# Patient Record
Sex: Male | Born: 1935 | Race: White | Hispanic: No | Marital: Married | Smoking: Former smoker
Health system: Southern US, Community
[De-identification: ages and names within clinical notes are randomized; demographics above are authoritative.]

## PROBLEM LIST (undated history)

## (undated) DIAGNOSIS — Z8709 Personal history of other diseases of the respiratory system: Secondary | ICD-10-CM

## (undated) DIAGNOSIS — M109 Gout, unspecified: Secondary | ICD-10-CM

## (undated) DIAGNOSIS — C159 Malignant neoplasm of esophagus, unspecified: Principal | ICD-10-CM

## (undated) DIAGNOSIS — K2270 Barrett's esophagus without dysplasia: Secondary | ICD-10-CM

## (undated) DIAGNOSIS — E119 Type 2 diabetes mellitus without complications: Secondary | ICD-10-CM

## (undated) DIAGNOSIS — K219 Gastro-esophageal reflux disease without esophagitis: Secondary | ICD-10-CM

## (undated) HISTORY — PX: OTHER SURGICAL HISTORY: SHX169

## (undated) HISTORY — PX: TONSILLECTOMY: SUR1361

## (undated) HISTORY — PX: CHOLECYSTECTOMY: SHX55

## (undated) HISTORY — DX: Malignant neoplasm of esophagus, unspecified: C15.9

## (undated) HISTORY — PX: NISSEN FUNDOPLICATION: SHX2091

## (undated) HISTORY — PX: LARYNGOSCOPY: SUR817

## (undated) SURGERY — EGD (ESOPHAGOGASTRODUODENOSCOPY)
Anesthesia: Moderate Sedation

---

## 1999-04-15 ENCOUNTER — Ambulatory Visit (HOSPITAL_COMMUNITY): Admission: RE | Admit: 1999-04-15 | Discharge: 1999-04-15 | Payer: Self-pay | Admitting: Gastroenterology

## 2001-02-27 ENCOUNTER — Ambulatory Visit (HOSPITAL_COMMUNITY): Admission: RE | Admit: 2001-02-27 | Discharge: 2001-02-27 | Payer: Self-pay | Admitting: Gastroenterology

## 2003-01-24 ENCOUNTER — Inpatient Hospital Stay (HOSPITAL_COMMUNITY): Admission: EM | Admit: 2003-01-24 | Discharge: 2003-01-27 | Payer: Self-pay | Admitting: Emergency Medicine

## 2003-01-24 ENCOUNTER — Encounter: Payer: Self-pay | Admitting: Emergency Medicine

## 2003-01-25 ENCOUNTER — Encounter: Payer: Self-pay | Admitting: Internal Medicine

## 2003-09-27 ENCOUNTER — Ambulatory Visit (HOSPITAL_COMMUNITY): Admission: RE | Admit: 2003-09-27 | Discharge: 2003-09-27 | Payer: Self-pay | Admitting: Gastroenterology

## 2003-09-27 ENCOUNTER — Encounter (INDEPENDENT_AMBULATORY_CARE_PROVIDER_SITE_OTHER): Payer: Self-pay | Admitting: Specialist

## 2005-12-09 ENCOUNTER — Encounter: Admission: RE | Admit: 2005-12-09 | Discharge: 2005-12-09 | Payer: Self-pay | Admitting: Gastroenterology

## 2007-03-13 ENCOUNTER — Ambulatory Visit (HOSPITAL_COMMUNITY): Admission: RE | Admit: 2007-03-13 | Discharge: 2007-03-13 | Payer: Self-pay | Admitting: Gastroenterology

## 2007-10-04 ENCOUNTER — Encounter: Admission: RE | Admit: 2007-10-04 | Discharge: 2007-10-27 | Payer: Self-pay | Admitting: Orthopedic Surgery

## 2008-10-11 ENCOUNTER — Encounter: Admission: RE | Admit: 2008-10-11 | Discharge: 2008-10-11 | Payer: Self-pay | Admitting: Internal Medicine

## 2009-08-19 ENCOUNTER — Ambulatory Visit (HOSPITAL_COMMUNITY): Admission: RE | Admit: 2009-08-19 | Discharge: 2009-08-19 | Payer: Self-pay | Admitting: Gastroenterology

## 2010-12-30 ENCOUNTER — Other Ambulatory Visit: Payer: Self-pay | Admitting: Gastroenterology

## 2011-04-30 NOTE — Discharge Summary (Signed)
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.  He was in his usual state of good health until 01/23/2003  when after eating two hot dogs he began to experience increased abdominal  distension and pain.  His last normal bowel movement was two days prior to  admission, although he had passed some watery fluid since then.  There had  been no fevers or chills.  There had been some retching but no vomiting.  On  arrival to the emergency room, a KUB and upright abdomen revealed evidence  of a massively dilated stomach and small-bowel obstruction.  He was admitted  for treatment.   PHYSICAL EXAMINATION:  GENERAL:  Examination at the time of admission  revealed a very uncomfortable man with substantial abdominal distension.  HEENT:  Within normal limits.  CHEST:  Clear.  BACK:  Examination revealed no CVA or point tenderness.  CARDIOVASCULAR:  Normal S1 and S2 without rubs, murmurs, or gallops.  ABDOMEN:  Distended.  Bowel sounds were markedly decreased.  There was  diffuse mild tenderness without guarding or rebound.  NEUROLOGIC:  Normal.  EXTREMITIES:  Normal.   RELEVANT LABORATORY STUDIES:  CBC revealed a white count of 7800, hemoglobin  15.8, hematocrit 45.1.  Sodium 140, potassium 3.6, chloride 106, bicarbonate  26, creatinine 1.1, BUN 27.  Of note was that the blood sugar was 336.  Liver profile was normal.   HOSPITAL COURSE:  Consultation was obtained from Dr. Magnus Ivan of the  general  surgery service who assisted with the patient's management.  On admission,  the patient had an NG tube placed.  He was made n.p.o. and was given  intravenous fluids in the form of normal saline with 10 mEq of KCl at 150 cc  per hour.  Protonix 40 mg IV daily was administered.  Because he was n.p.o.,  I put him on a sliding scale insulin regimen.  Subsequent laboratory studies  included a hemoglobin A1C which was 8.4, suggesting that his blood sugar had  been abnormal for some time.   An upper GI and small-bowel follow-through was done.  This revealed evidence  of the patient's previous Nissen fundoplication.  There were no signs of  gastric outlet obstruction.  A small bowel study revealed dilatation of the  jejunum with a probable area of partial small-bowel obstruction in the  region of the mid-small bowel.  In spite of this finding, the patient's  condition steadily improved.  He started passing flatus and then eventually  had good bowel function.  By 01/27/2003, he was comfortable.  He was eating  food without difficulty.  He had good bowel sounds.  X-rays obtained  01/26/2003 showed that contrast had passed into the colon without difficulty  and that the obstruction picture had resolved.  It was therefore the opinion  of the surgical doctors that the patient could be discharged.  Therefore on  01/27/2003, the patient was discharged.   DISCHARGE DIAGNOSES:  1. Small-bowel obstruction, mid-small bowel, of uncertain etiology, possibly     secondary to adhesion.  2. Diabetes.  3. History of Nissen fundoplication.  4. History of Barrett's esophagus.  5. History of gout.   DISCHARGE MEDICATIONS:  1. Allopurinol 50 mg daily.  2. Baby aspirin one daily.  3. Amaryl 2 mg daily.   FOLLOW UP:  He has decided that he wants to follow up with Dr. Merril Abbe at  [facility], and I will make sure that a copy of this  dictation goes to Dr. Merril Abbe.   NOTE:  A  glucometer was provided to the patient at the time of discharge.  His wife is a nurse who is very knowledgeable about diabetes and will help  him with monitoring his blood sugars at home.   CONDITION ON DISCHARGE:  Good.                                               Sherin Quarry, MD    SY/MEDQ  D:  01/27/2003  T:  01/27/2003  Job:  [id number]   cc:   Fayrene Fearing L. Malon Kindle., M.D.  [address]  [place]  [place] [address]  Fax: [phone]   Ike Bene, M.D.  [address]  [place]  [place] [address]  Fax: [phone]   Thornton Park. Daphine Deutscher, M.D.  [address]  [place]  [place] [address]  Fax: [phone]

## 2011-04-30 NOTE — Procedures (Signed)
[facility]  Patient:    Matthew White, Matthew White                  MRN: [id number] Proc. Date: 02/27/01 Adm. Date:  [id number] Attending:  Orland Mustard CC:         Thornton Park. Daphine Deutscher, M.D.   Procedure Report  PROCEDURE:  Esophagogastroduodenoscopy and biopsy.  MEDICATIONS:  Cetacaine spray, fentanyl 87.5 mcg, Versed 9 mg IV.  INDICATION:  A nice 77 year old gentleman with a long history of Barretts esophagus.  Had a ______ done for history of reflux disease in 1998 and underwent a laparoscopic Nissen fundoplication.  Has done exceedingly well. He is undergoing a screening test for Barretts esophagus.  INFORMED CONSENT:  The procedure had been explained to the patient and consent obtained.  DESCRIPTION OF PROCEDURE:  With the patient in the left lateral decubitus position, the Olympus video endoscope was inserted blindly into the esophagus and advanced under direct visualization.  The stomach was entered, pylorus identified and passed.  Duodenum including the bulb and second portion seen well and were unremarkable.  The scope withdrawn back into the stomach.  The pyloric channel, antrum, and body were all normal.  Fundus and cardia were seen well on retroflexed view and were normal.  Diaphragmatic hiatus was located at 40 cm.  There was gastric-appearing mucosa extending from 40 cm up to 29-30 cm consistent with Barretts esophagus.  The GE junction was somewhat irregular at that area.  Had the exact appearance it had had on previous endoscopies.  Multiple biopsies were taken randomly throughout.  The scope was withdrawn.  The patient tolerated the procedure well.  ASSESSMENT:  Barretts esophagus extending from 40 to 30 cm with no gross evidence of ulceration or tumor.  PLAN:  We will check pathology and probably recommend repeating in two years. DD:  02/27/01 TD:  02/28/01 Job: [id number]

## 2011-04-30 NOTE — Op Note (Signed)
NAME:  Matthew White, Matthew White                ACCOUNT NO.:  [id number]   MEDICAL RECORD NO.:  [id number]          PATIENT TYPE:  AMB   LOCATION:  ENDO                         FACILITY:  [facility]   PHYSICIAN:  James L. Malon Kindle., M.D.DATE OF BIRTH:  [birth year]   DATE OF PROCEDURE:  03/13/2007  DATE OF DISCHARGE:                               OPERATIVE REPORT   PROCEDURE:  Esophagoscopy with esophageal dilatation under fluoroscopic  guidance.   MEDICATIONS:  Fentanyl 75 mcg, Versed 7.5 mg IV.   INDICATION:  This nice gentleman has Barrett's esophagus, has undergone  previous lap Nissen, has been doing well.  His Barrett's is biopsied  routinely.  He has had trouble swallowing and had a stricture and recent  endoscopy with biopsy.  We were able to get the Olympus scope through it  and he has continued to be treated for reflux.   DESCRIPTION OF PROCEDURE:  The procedure has been explained to the  patient, including potential risks and benefits, and consent obtained.  In the supine position for endoscopy using fluoroscopic control, the  scope was inserted.  The Pentax scope was inserted, although the Olympus  scope did pass last endoscopy and the Pentax scope would not pass.  The  stricture was located at the distal part of the esophagus.  There was  Barrett's esophagus.  Using fluoroscopic guidance, I passed the [facility]  guide wire through the stricture into the stomach.  We then withdrew the  scope over the guide wire, taking care to make sure that the wire was  not moved.  I then passed in a sequential manner with the patient's neck  extended using fluoroscopic guidance the 27, 30, 33 and 36 [place]  dilators.  There was a small amount of heme with the 36-French dilator.  The scope was withdrawn and the patient tolerated the procedure well.   ASSESSMENT:  Normal esophageal stricture dilated to 36 [place].   PLAN:  Routine liquids for 4 hours, soft foods, advance diet.  Will call  us back as  needed.  Will see back in the office in 6 months.           ______________________________  Llana Aliment. Malon Kindle., M.D.     Waldron Session  D:  03/13/2007  T:  03/13/2007  Job:  [id number]

## 2011-04-30 NOTE — Op Note (Signed)
NAME:  Matthew White, Matthew White                     ACCOUNT NO.:  [id number]   MEDICAL RECORD NO.:  [id number]                   PATIENT TYPE:  AMB   LOCATION:  ENDO                                 FACILITY:  [facility]   PHYSICIAN:  James L. Malon Kindle., M.D.          DATE OF BIRTH:  1934/07/29   DATE OF PROCEDURE:  09/27/2003  DATE OF DISCHARGE:                                 OPERATIVE REPORT   PROCEDURE:  Esophagogastroduodenoscopy with biopsy.   MEDICATIONS:  Cetacaine spray, fentanyl 50 mcg, Versed 5 mg IV.   INDICATIONS:  A very nice gentleman who had a history of Barrett's esophagus  with some metaplasia, no dysplasia.  He had a laparoscopic Nissen done by  Dr. Daphine Deutscher back in 1998.  He has developed a slight problem with swallowing,  been on a strict diet due to dysphagia and follow-up for Barrett's, an  endoscopy is performed.  He is not current on any PPIs.   DESCRIPTION OF PROCEDURE:  The procedure had been explained to the patient  and consent obtained.  With the patient in the left lateral decubitus  position, the Olympus scope was inserted and advanced.  The stomach was  entered, pylorus identified and passed.  The duodenum including bulb and  second portion was seen well and was unremarkable.  The scope was withdrawn  back into the stomach, and the pyloric channel, antrum, and body were  normal.  Fundus and cardia were seen well on the retroflexed view.  In the  retroflexed view the wrap appeared to be intact.  This was photographed.  The diaphragmatic hiatus was located at 40 cm.  It was very difficult to  tell exactly where the hiatal hernia ended and the esophagus began.  The  mucosa was the same.  I felt that there was probably a 2-3 cm hiatal hernia.  There was marked Barrett's from approximately 40 cm to 28 cm.  In this area  right at about 35 cm was an ulcerated area that had small linear erosions  and was somewhat edematous.  Multiple biopsies were taken in this area  as  well as randomly throughout the entire Barrett's segment and placed in a  single jar.  There was a clear demarcation line that was somewhat irregular  but was right about 28-30 cm.  The mucosa on the proximal side of that line  was clearly squamous mucosa and esophageal mucosa.  The scope was withdrawn.  The patient tolerated the procedure well.  There were no immediate problems.   ASSESSMENT:  Barrett's esophagus with some ulceration, possibly due to  recurrent reflux.    PLAN:  Will start him on Aciphex daily, check the results of the pathology  report.  We will likely recommend repeating endoscopy and biopsy in two  years.  Will see back in the office in six or eight weeks.  James L. Malon Kindle., M.D.    Waldron Session  D:  09/27/2003  T:  09/27/2003  Job:  [id number]   cc:   Ike Bene, M.D.  [address]  [place]  [place] [address]  Fax: [phone]   Thornton Park. Daphine Deutscher, M.D.  [address]  [place]  [place] [address]  Fax: [phone]

## 2011-04-30 NOTE — Consult Note (Signed)
NAME:  Matthew White, Matthew White                     ACCOUNT NO.:  [id number]   MEDICAL RECORD NO.:  [id number]                   PATIENT TYPE:  INP   LOCATION:  0456                                 FACILITY:  [facility]   PHYSICIAN:  Abigail Miyamoto, M.D.              DATE OF BIRTH:  Dec 04, 1934   DATE OF CONSULTATION:  01/24/2003  DATE OF DISCHARGE:                                   CONSULTATION   REASON FOR CONSULTATION:  Acute small bowel obstruction.   HISTORY OF PRESENT ILLNESS:  The patient is a 77 year old gentleman who has  a previous medical history of having had a laparoscopic Nissen  fundoplication approximately six years ago who after eating two hot dogs  last night developed abdominal distention and pain.  It worsened throughout  the night.  He could not vomit secondary to his previous Nissen, although he  felt some retching.  He reports he has passed flatus and moved his bowels.  He presented to [facility] ER this morning without improvement.  He reports  his bowel movements had been relatively normal prior to this.  He denies any  fevers, chills, chest pain, or shortness of breath.  He also had no dysuria.   PAST MEDICAL HISTORY:  Significant for repair of his esophagus.  Again, he  had the Nissen fundoplication approximately six years ago.  He has no  previous surgical history.  He also has a history of gout.   MEDICATIONS:  1. Allopurinol.  2. Aspirin daily.   ALLERGIES:  He is not allergic to any medications, but is allergic to  CONTRAST DYE from CAT scans which causes swelling.   His last EGD was in March 2002 which showed the Barrett's changes.   SOCIAL HISTORY:  He has not smoked for the past 20 years.  He drinks alcohol  just occasionally.  He is retired.   PHYSICAL EXAMINATION:  GENERAL:  Obese gentleman in no acute distress.  VITAL SIGNS:  He is afebrile.  His vital signs are stable except for mild  tachycardia.  HEENT:  He is anicteric.  His oropharynx  clear.  NECK:  Supple.  LUNGS:  Clear to auscultation bilaterally.  CARDIOVASCULAR:  Tachycardic, but regular rhythm.  ABDOMEN:  Soft.  There is some mild fullness in the epigastrium.  There are  positive bowel sounds.  EXTREMITIES:  Warm and well perfused.  NEUROLOGIC:  He is grossly intact focally.   LABORATORY DATA:  The patient has a white blood count of 7.8, hemoglobin  15.8, hematocrit 45.1, platelets 223,000.  Neutrophils are 91.  Electrolytes  show he has a BUN 27, creatinine 1.1, glucose 336.  LFTs are normal.  The  patient has x-rays showing him to have a massively dilated stomach and  proximal small bowel consistent with a high grade bowel obstruction.   IMPRESSION:  A 77 year old gentleman with a sudden onset of abdominal  bloating and pain and x-rays consistent  with a bowel obstruction.  It is  difficult to say whether this is related to his previous Nissen given the  proximal nature of what appears on his x-rays.  A nasogastric tube has been  placed and has caused him to have much relief of his symptoms.  At this  point the plan will be to continue NG suctioning and intravenous rehydration  and will repeat abdominal x-rays in the morning.  We may proceed with an  upper gastrointestinal contrast study to evaluate his anatomy as well as the  Nissen to rule out volvulus or any other cause of his bowel obstruction.  I  will also discuss this with Matthew White, M.D. who has done his  previous surgery for his opinion.  We will follow him closely with you.                                               Abigail Miyamoto, M.D.    DB/MEDQ  D:  01/24/2003  T:  01/24/2003  Job:  [id number]

## 2012-10-17 ENCOUNTER — Encounter (HOSPITAL_COMMUNITY): Payer: Self-pay | Admitting: Pharmacy Technician

## 2012-10-18 ENCOUNTER — Ambulatory Visit (HOSPITAL_COMMUNITY)
Admission: RE | Admit: 2012-10-18 | Discharge: 2012-10-18 | Disposition: A | Discharge status: Home / Self Care | Payer: Medicare Other | Source: Ambulatory Visit | Attending: Gastroenterology | Admitting: Gastroenterology

## 2012-10-18 ENCOUNTER — Ambulatory Visit (HOSPITAL_COMMUNITY): Payer: Medicare Other

## 2012-10-18 ENCOUNTER — Encounter (HOSPITAL_COMMUNITY)
Admission: RE | Disposition: A | Discharge status: Home / Self Care | Payer: Self-pay | Source: Ambulatory Visit | Attending: Gastroenterology

## 2012-10-18 ENCOUNTER — Encounter (HOSPITAL_COMMUNITY): Payer: Self-pay

## 2012-10-18 DIAGNOSIS — K2270 Barrett's esophagus without dysplasia: Secondary | ICD-10-CM | POA: Insufficient documentation

## 2012-10-18 DIAGNOSIS — M109 Gout, unspecified: Secondary | ICD-10-CM | POA: Insufficient documentation

## 2012-10-18 DIAGNOSIS — K219 Gastro-esophageal reflux disease without esophagitis: Secondary | ICD-10-CM | POA: Insufficient documentation

## 2012-10-18 DIAGNOSIS — Z7982 Long term (current) use of aspirin: Secondary | ICD-10-CM | POA: Insufficient documentation

## 2012-10-18 DIAGNOSIS — R1310 Dysphagia, unspecified: Secondary | ICD-10-CM | POA: Insufficient documentation

## 2012-10-18 DIAGNOSIS — Z79899 Other long term (current) drug therapy: Secondary | ICD-10-CM | POA: Insufficient documentation

## 2012-10-18 DIAGNOSIS — E119 Type 2 diabetes mellitus without complications: Secondary | ICD-10-CM | POA: Insufficient documentation

## 2012-10-18 DIAGNOSIS — K222 Esophageal obstruction: Secondary | ICD-10-CM | POA: Insufficient documentation

## 2012-10-18 HISTORY — DX: Barrett's esophagus without dysplasia: K22.70

## 2012-10-18 HISTORY — DX: Type 2 diabetes mellitus without complications: E11.9

## 2012-10-18 HISTORY — PX: SAVORY DILATION: SHX5439

## 2012-10-18 HISTORY — DX: Gout, unspecified: M10.9

## 2012-10-18 HISTORY — DX: Gastro-esophageal reflux disease without esophagitis: K21.9

## 2012-10-18 HISTORY — PX: ESOPHAGOGASTRODUODENOSCOPY: SHX5428

## 2012-10-18 SURGERY — EGD (ESOPHAGOGASTRODUODENOSCOPY)
Anesthesia: Moderate Sedation

## 2012-10-18 MED ORDER — FENTANYL CITRATE 0.05 MG/ML IJ SOLN
INTRAMUSCULAR | Status: DC | PRN
  Administered 2012-10-18 (×2): 25 ug via INTRAVENOUS

## 2012-10-18 MED ORDER — MIDAZOLAM HCL 10 MG/2ML IJ SOLN
INTRAMUSCULAR | Status: DC | PRN
  Administered 2012-10-18 (×2): 2.5 mg via INTRAVENOUS

## 2012-10-18 MED ORDER — FENTANYL CITRATE 0.05 MG/ML IJ SOLN
INTRAMUSCULAR | Status: AC
  Filled 2012-10-18: qty 4

## 2012-10-18 MED ORDER — MIDAZOLAM HCL 10 MG/2ML IJ SOLN
INTRAMUSCULAR | Status: AC
  Filled 2012-10-18: qty 4

## 2012-10-18 MED ORDER — BUTAMBEN-TETRACAINE-BENZOCAINE 2-2-14 % EX AERO
INHALATION_SPRAY | CUTANEOUS | Status: DC | PRN
  Administered 2012-10-18: 2 via TOPICAL

## 2012-10-18 MED ORDER — SODIUM CHLORIDE 0.9 % IV SOLN: INTRAVENOUS | Status: DC

## 2012-10-18 NOTE — Progress Notes (Signed)
1050 fentanyl 25 mcg/versed 2 mg iv given during procedure. 1102 fentanyl 25 mcg/ versed 2mg  iv given during procedure.Total sedation  Fentanyl 100 mcg and versed 8 mg iv given

## 2012-10-18 NOTE — H&P (Signed)
  Subjective:   Patient is a 78 y.o. male presents with patient with long history of esophageal reflux. This had Barrett's esophagus and is endoscoped regularly with biopsies. He's had previous Nissen fundoplication. He has a reflux stricture as had multiple prior dilatations. He is normally dilated to approximately 15 mm. He is having some increasing dysphagia and is set up for repeat dilatation.. Procedure including risks and benefits discussed in office.  There are no active problems to display for this patient.  Past Medical History  Diagnosis Date  . Diabetes mellitus without complication   . GERD (gastroesophageal reflux disease)   . Gout   . Barrett esophagus     Past Surgical History  Procedure Date  . Nissen fundoplication   . Tonsillectomy   . Laryngoscopy     Prescriptions prior to admission  Medication Sig Dispense Refill  . allopurinol (ZYLOPRIM) 100 MG tablet Take 50 mg by mouth daily.      Marland Kitchen aspirin EC 81 MG tablet Take 81 mg by mouth every evening.      . fish oil-omega-3 fatty acids 1000 MG capsule Take 1 g by mouth daily.      Marland Kitchen glimepiride (AMARYL) 2 MG tablet Take 2 mg by mouth daily before breakfast.      . metFORMIN (GLUCOPHAGE) 500 MG tablet Take 500 mg by mouth 2 (two) times daily with a meal.      . omeprazole (PRILOSEC) 20 MG capsule Take 20 mg by mouth 2 (two) times daily.      . simvastatin (ZOCOR) 10 MG tablet Take 10 mg by mouth at bedtime.       Allergies  Allergen Reactions  . Ivp Dye (Iodinated Diagnostic Agents) Swelling    History  Substance Use Topics  . Smoking status: Former Smoker    Quit date: 10/19/1983  . Smokeless tobacco: Not on file  . Alcohol Use: No    History reviewed. No pertinent family history.   Objective:   Patient Vitals for the past 8 hrs:  BP Temp Temp src Pulse Resp SpO2 Height Weight  10/18/12 0912 194/67 mmHg - - 65  19  98 % 6\' 1"  (1.854 m) 95.255 kg (210 lb)  10/18/12 0910 - 98.5 F (36.9 C) Oral - - - - -           See MD Preop evaluation      Assessment:   1.esophageal reflux with reflux stricture.  Plan:   For EGD and Savary dilatation. Patient is procedure done multiple times in the risk and benefits have been discussed.

## 2012-10-18 NOTE — Op Note (Signed)
[facility] [address] [place], [address]   ENDOSCOPY PROCEDURE REPORT  PATIENT: Matthew White, Matthew White  MR#: [id number] BIRTHDATE: [birth year] , 78  yrs. old GENDER: Male ENDOSCOPIST:Kortez Murtagh Randa Evens, MD REFERRED BY:  Dr. Timothy Lasso PROCEDURE DATE:  10/18/2012 PROCEDURE: EGD with Savary dilation under fluoroscopy cytology brushing ASA CLASS:   class II INDICATIONS: 78 year old with a long history of esophageal reflux status post Nissen fundoplication with subsequent Barrett's esophagus with routine Barrett's screening. Has reflux stricture that has required dilatation periodically ever since his Nissen. Having some increasing dysphagia and elective dilatation schedule MEDICATION:    fentanyl 100 mcg, Versed 8 mg IV TOPICAL ANESTHETIC: Cetacaine spray  DESCRIPTION OF PROCEDURE:   The adult Pentax endoscope inserted blindly into the esophagus. The stricture found in the distal esophagus quite tight. It was inflamed and ulcerated above the stricture. The scope would not pass. Using fluoroscopic guidance we were able to pass the Savary guidewire into the stomach. Scope withdrawn and using fluoroscopic guidanceboth 8 mm and 9 mm Savary dilators passed. Unfortunately, the guidewire was pulled back into the esophagus with removal of a 9 mm dilator. I then reinserted the adult scope and attempted to pass the guidewire again was unable to do so easily. We then switched to the pediatric endoscope which passed the stricture with gentle pressure into the large hiatal hernia sac and down into the stomach. Complete endoscopy performed. We then threaded the guidewire through the scope and removed the scope. 10 mm, 11 mm, and 12 mm Savary dilators were passed with resistance with the 12 mm. I then reinserted the pediatric endoscope and the area was quite friable. The ulcerated areas above the stricture were brushed for cytology. The scope was withdrawn the patient tolerated the  procedure well and there were no immediate problems.     COMPLICATIONS: None obvioushad termination the procedure.  ENDOSCOPIC IMPRESSION: 1.Esophageal Stricture. Dilated from 8 mm to 12 mm 2. Marked ulceration above stricture in the area of Barrett's esophagus despite the use of PPI therapy. After dilation, this area was brushed for cytology.  RECOMMENDATIONS: 1. Continue current medications and will begin Carafate slurry. 2. Will check results of cytology brushing. He will likely need repeat EGD and biopsy. 3. Routine postdilatation orders.    _______________________________ Rosalie DoctorCarman Ching, MD 10/18/2012 11:37 AM  CC: Dr Timothy Lasso  PATIENT NAME:  Matthew White, Matthew White MR#: [id number]

## 2012-10-19 ENCOUNTER — Encounter (HOSPITAL_COMMUNITY): Payer: Self-pay

## 2012-10-19 ENCOUNTER — Encounter (HOSPITAL_COMMUNITY): Payer: Self-pay | Admitting: Gastroenterology

## 2012-12-22 ENCOUNTER — Encounter (HOSPITAL_COMMUNITY): Payer: Self-pay | Admitting: Gastroenterology

## 2012-12-22 ENCOUNTER — Ambulatory Visit (HOSPITAL_COMMUNITY)
Admission: RE | Admit: 2012-12-22 | Discharge: 2012-12-22 | Disposition: A | Discharge status: Home / Self Care | Payer: Medicare PPO | Source: Ambulatory Visit | Attending: Gastroenterology | Admitting: Gastroenterology

## 2012-12-22 ENCOUNTER — Encounter (HOSPITAL_COMMUNITY)
Admission: RE | Disposition: A | Discharge status: Home / Self Care | Payer: Self-pay | Source: Ambulatory Visit | Attending: Gastroenterology

## 2012-12-22 DIAGNOSIS — K219 Gastro-esophageal reflux disease without esophagitis: Secondary | ICD-10-CM | POA: Insufficient documentation

## 2012-12-22 DIAGNOSIS — Z7982 Long term (current) use of aspirin: Secondary | ICD-10-CM | POA: Insufficient documentation

## 2012-12-22 DIAGNOSIS — C159 Malignant neoplasm of esophagus, unspecified: Secondary | ICD-10-CM

## 2012-12-22 DIAGNOSIS — K2270 Barrett's esophagus without dysplasia: Secondary | ICD-10-CM | POA: Insufficient documentation

## 2012-12-22 DIAGNOSIS — Z79899 Other long term (current) drug therapy: Secondary | ICD-10-CM | POA: Insufficient documentation

## 2012-12-22 DIAGNOSIS — E119 Type 2 diabetes mellitus without complications: Secondary | ICD-10-CM | POA: Insufficient documentation

## 2012-12-22 HISTORY — DX: Malignant neoplasm of esophagus, unspecified: C15.9

## 2012-12-22 HISTORY — PX: SAVORY DILATION: SHX5439

## 2012-12-22 HISTORY — PX: ESOPHAGOGASTRODUODENOSCOPY: SHX5428

## 2012-12-22 SURGERY — EGD (ESOPHAGOGASTRODUODENOSCOPY)
Anesthesia: Moderate Sedation

## 2012-12-22 MED ORDER — MIDAZOLAM HCL 10 MG/2ML IJ SOLN
INTRAMUSCULAR | Status: DC | PRN
  Administered 2012-12-22 (×2): 2 mg via INTRAVENOUS

## 2012-12-22 MED ORDER — SODIUM CHLORIDE 0.9 % IV SOLN
INTRAVENOUS | Status: DC
  Administered 2012-12-22: 500 mL via INTRAVENOUS

## 2012-12-22 MED ORDER — FENTANYL CITRATE 0.05 MG/ML IJ SOLN
INTRAMUSCULAR | Status: AC
  Filled 2012-12-22: qty 2

## 2012-12-22 MED ORDER — MIDAZOLAM HCL 10 MG/2ML IJ SOLN
INTRAMUSCULAR | Status: AC
  Filled 2012-12-22: qty 2

## 2012-12-22 MED ORDER — FENTANYL CITRATE 0.05 MG/ML IJ SOLN
INTRAMUSCULAR | Status: DC | PRN
  Administered 2012-12-22 (×2): 25 ug via INTRAVENOUS

## 2012-12-22 MED ORDER — BUTAMBEN-TETRACAINE-BENZOCAINE 2-2-14 % EX AERO
INHALATION_SPRAY | CUTANEOUS | Status: DC | PRN
  Administered 2012-12-22: 2 via TOPICAL

## 2012-12-22 NOTE — H&P (Signed)
  Subjective:   Patient is a 79 y.o. male presents with Pt with history of stricture and Barretts for EGD and possible dilatation. Procedure including risks and benefits discussed in office.  There are no active problems to display for this patient.  Past Medical History  Diagnosis Date  . Diabetes mellitus without complication   . GERD (gastroesophageal reflux disease)   . Gout   . Barrett esophagus     Past Surgical History  Procedure Date  . Nissen fundoplication   . Tonsillectomy   . Laryngoscopy   . Esophagogastroduodenoscopy 10/18/2012    Procedure: ESOPHAGOGASTRODUODENOSCOPY (EGD);  Surgeon: Vertell Novak., MD;  Location: [facility];  Service: Endoscopy;  Laterality: N/A;  need xray  . Savory dilation 10/18/2012    Procedure: SAVORY DILATION;  Surgeon: Vertell Novak., MD;  Location: [facility];  Service: Endoscopy;  Laterality: N/A;    Prescriptions prior to admission  Medication Sig Dispense Refill  . allopurinol (ZYLOPRIM) 100 MG tablet Take 50 mg by mouth daily.      Marland Kitchen aspirin EC 81 MG tablet Take 81 mg by mouth every evening.      . fish oil-omega-3 fatty acids 1000 MG capsule Take 1 g by mouth daily.      Marland Kitchen glimepiride (AMARYL) 2 MG tablet Take 2 mg by mouth daily before breakfast.      . metFORMIN (GLUCOPHAGE) 500 MG tablet Take 500 mg by mouth 2 (two) times daily with a meal.      . omeprazole (PRILOSEC) 20 MG capsule Take 20 mg by mouth 2 (two) times daily.      . simvastatin (ZOCOR) 10 MG tablet Take 10 mg by mouth at bedtime.       Allergies  Allergen Reactions  . Ivp Dye (Iodinated Diagnostic Agents) Swelling    History  Substance Use Topics  . Smoking status: Former Smoker    Quit date: 10/19/1983  . Smokeless tobacco: Not on file  . Alcohol Use: No    History reviewed. No pertinent family history.   Objective:   Patient Vitals for the past 8 hrs:  BP Temp Temp src Pulse Resp SpO2 Height Weight  12/22/12 1306 152/74 mmHg 98.7 F (37.1  C) Oral 91  17  98 % 6\' 1"  (1.854 m) 93.441 kg (206 lb)         See MD Preop evaluation      Assessment:   Esophageal stricture  Plan:   EGD dilation ? biopsey

## 2012-12-22 NOTE — Op Note (Signed)
[facility] [address] [place], [address]   ENDOSCOPY PROCEDURE REPORT  PATIENT: Matthew White, Matthew White  MR#: [id number] BIRTHDATE: February 16, 1934 , 78  yrs. old GENDER: Male ENDOSCOPIST:Romano Stigger Randa Evens, MD REFERRED BY:  Dr Timothy Lasso PROCEDURE DATE:  12/22/2012 PROCEDURE:    EGD with Biopsy ASA CLASS:     class 2 INDICATIONS:   History of Barretts with prior stricture, ? dialtion MEDICATION:   fentanyl 50 mcg, versed 4 mg  IV TOPICAL ANESTHETIC:  cetacaine spray  DESCRIPTION OF PROCEDURE: Peds scope passed with swallowing w/o problems. Barretts involved the majority of the esophagus. Marked inflamation above the strictured area but stricture patent and scope passed w/o problems. Stomach and duodenum are normal.  Scope withdrawn and multiple biopsies were taken. Tolerated well . No dilation done.     COMPLICATIONS: None  ENDOSCOPIC IMPRESSION: 1. Marked Ulceration in area of Barretts esophagus  ? neoplasia 2. Stricture present but patent no dilation performed.  RECOMMENDATIONS: Continue carafate, add Dexilant check path.    _______________________________ Rosalie DoctorCarman Ching, MD 12/22/2012 2:17 PM   CC: Dr Timothy Lasso

## 2012-12-25 ENCOUNTER — Encounter (HOSPITAL_COMMUNITY): Payer: Self-pay | Admitting: Gastroenterology

## 2012-12-26 ENCOUNTER — Other Ambulatory Visit (HOSPITAL_COMMUNITY): Payer: Self-pay | Admitting: Gastroenterology

## 2012-12-26 DIAGNOSIS — C154 Malignant neoplasm of middle third of esophagus: Secondary | ICD-10-CM

## 2012-12-29 ENCOUNTER — Encounter (HOSPITAL_COMMUNITY): Payer: Medicare PPO

## 2012-12-29 ENCOUNTER — Ambulatory Visit (HOSPITAL_COMMUNITY)
Admission: RE | Admit: 2012-12-29 | Discharge: 2012-12-29 | Disposition: A | Discharge status: Home / Self Care | Payer: Medicare PPO | Source: Ambulatory Visit | Attending: Gastroenterology | Admitting: Gastroenterology

## 2012-12-29 ENCOUNTER — Ambulatory Visit (HOSPITAL_COMMUNITY): Payer: Medicare PPO

## 2012-12-29 ENCOUNTER — Encounter (HOSPITAL_COMMUNITY): Payer: Self-pay

## 2012-12-29 DIAGNOSIS — C159 Malignant neoplasm of esophagus, unspecified: Secondary | ICD-10-CM | POA: Insufficient documentation

## 2012-12-29 DIAGNOSIS — K5730 Diverticulosis of large intestine without perforation or abscess without bleeding: Secondary | ICD-10-CM | POA: Insufficient documentation

## 2012-12-29 DIAGNOSIS — C154 Malignant neoplasm of middle third of esophagus: Secondary | ICD-10-CM

## 2012-12-29 DIAGNOSIS — Q619 Cystic kidney disease, unspecified: Secondary | ICD-10-CM | POA: Insufficient documentation

## 2012-12-29 MED ORDER — IOHEXOL 300 MG/ML  SOLN
100.0000 mL | Freq: Once | INTRAMUSCULAR | Status: AC | PRN
  Administered 2012-12-29: 100 mL via INTRAVENOUS

## 2013-01-02 ENCOUNTER — Institutional Professional Consult (permissible substitution) (INDEPENDENT_AMBULATORY_CARE_PROVIDER_SITE_OTHER): Payer: Medicare PPO | Admitting: Cardiothoracic Surgery

## 2013-01-02 ENCOUNTER — Encounter: Payer: Self-pay | Admitting: Cardiothoracic Surgery

## 2013-01-02 ENCOUNTER — Other Ambulatory Visit: Payer: Self-pay

## 2013-01-02 VITALS — BP 160/90 | HR 100 | Resp 20 | Ht 74.0 in | Wt 210.0 lb

## 2013-01-02 DIAGNOSIS — C159 Malignant neoplasm of esophagus, unspecified: Secondary | ICD-10-CM

## 2013-01-03 ENCOUNTER — Encounter (HOSPITAL_COMMUNITY): Payer: Self-pay | Admitting: *Deleted

## 2013-01-03 NOTE — Pre-Procedure Instructions (Signed)
Your procedure is scheduled on:Wednesday, January 10, 2013 Report to [facility] Admitting [id number] Call this number if you have problems morning of your procedure:[phone]  Follow all bowel prep instructions per your doctor's orders.  Do not eat or drink anything after midnight the night before your procedure. You may brush your teeth, rinse out your mouth, but no water, no food, no chewing gum, no mints, no candies, no chewing tobacco.     Take these medicines the morning of your procedure with A SIP OF WATER:None   Please make arrangements for a responsible person to drive you home after the procedure. You cannot go home by cab/taxi. We recommend you have someone with you at home the first 24 hours after your procedure. Driver for procedure is wife Becky [phone] cell phone  LEAVE ALL VALUABLES, JEWELRY, BILLFOLD AT HOME.  NO DENTURES, CONTACT LENSES ALLOWED IN THE ENDOSCOPY ROOM.   YOU MAY WEAR DEODORANT, PLEASE REMOVE ALL JEWELRY, WATCHES RINGS, BODY PIERCINGS AND LEAVE AT HOME.   WOMEN: NO MAKE-UP, LOTIONS PERFUMES

## 2013-01-04 ENCOUNTER — Encounter (HOSPITAL_COMMUNITY): Payer: Medicare Other

## 2013-01-04 ENCOUNTER — Ambulatory Visit (HOSPITAL_COMMUNITY)
Admission: RE | Admit: 2013-01-04 | Discharge: 2013-01-04 | Disposition: A | Discharge status: Home / Self Care | Payer: Medicare PPO | Source: Ambulatory Visit | Attending: Cardiothoracic Surgery | Admitting: Cardiothoracic Surgery

## 2013-01-04 ENCOUNTER — Encounter (HOSPITAL_COMMUNITY): Payer: Self-pay

## 2013-01-04 ENCOUNTER — Encounter (HOSPITAL_COMMUNITY)
Admission: RE | Admit: 2013-01-04 | Discharge: 2013-01-04 | Disposition: A | Discharge status: Home / Self Care | Payer: Medicare PPO | Source: Ambulatory Visit | Attending: Gastroenterology | Admitting: Gastroenterology

## 2013-01-04 DIAGNOSIS — C159 Malignant neoplasm of esophagus, unspecified: Secondary | ICD-10-CM | POA: Insufficient documentation

## 2013-01-04 DIAGNOSIS — R0609 Other forms of dyspnea: Secondary | ICD-10-CM | POA: Insufficient documentation

## 2013-01-04 DIAGNOSIS — C154 Malignant neoplasm of middle third of esophagus: Secondary | ICD-10-CM | POA: Insufficient documentation

## 2013-01-04 DIAGNOSIS — R0989 Other specified symptoms and signs involving the circulatory and respiratory systems: Secondary | ICD-10-CM | POA: Insufficient documentation

## 2013-01-04 DIAGNOSIS — Z87891 Personal history of nicotine dependence: Secondary | ICD-10-CM | POA: Insufficient documentation

## 2013-01-04 LAB — PULMONARY FUNCTION TEST

## 2013-01-04 MED ORDER — FLUDEOXYGLUCOSE F - 18 (FDG) INJECTION
19.3000 | Freq: Once | INTRAVENOUS | Status: AC | PRN
  Administered 2013-01-04: 19.3 via INTRAVENOUS

## 2013-01-04 MED ORDER — ALBUTEROL SULFATE (5 MG/ML) 0.5% IN NEBU
2.5000 mg | INHALATION_SOLUTION | Freq: Once | RESPIRATORY_TRACT | Status: AC
  Administered 2013-01-04: 2.5 mg via RESPIRATORY_TRACT

## 2013-01-05 ENCOUNTER — Encounter (HOSPITAL_COMMUNITY): Payer: Medicare PPO

## 2013-01-07 NOTE — Patient Instructions (Signed)
Esophageal Cancer Esophageal cancer occurs when abnormal cells within the esophagus begin to divide rapidly and uncontrollably. The esophagus is the tube that carries food and drink from the throat into the stomach.  CAUSES  The exact cause of esophageal cancer is not known. It is believed that esophageal cancer occurs due to many factors. People are at a higher risk of developing esophageal cancer if they:  Are older than 77 years of age.  Are male.  Smoke or use tobacco.  Drink heavily.  Eat a poor diet (low in fruits and vegetables).  Are overweight (obese).  Have conditions which result in long-standing damage or irritation to the esophagus. These conditions include:  Acid reflux (the stomach acid leaks back up the esophagus, damaging it).  Barrett Esophagus (abnormal cells are found in the lower part of the esophagus, usually due to acid reflux occurring over a long period of time).  Achalasia (the muscles and nerves of the esophagus do not work properly. Food and drink do not move in a normal way down the esophagus and into the stomach).  Esophageal webs (thin strings of tissues grow within the esophagus).  Damage due to toxic exposures (an example would be swallowing a caustic poison such as lye). SYMPTOMS  Symptoms can include:  Trouble swallowing.  Chest or back pain.  Unintentional weight loss.  Severe tiredness (fatigue).  Hoarse voice.  Cough. DIAGNOSIS  Esophageal cancer is usually diagnosed by performing:  Barium swallow. After drinking barium (a liquid that coats the esophagus), a series of X-rays are taken which can reveal abnormalities.  Endoscopic exam. A lighted scope is used to view the inside of the esophagus.  Biopsy. Small pieces of the esophagus are removed for exam in a lab. This can be done through the scope used for an endoscopic exam. TREATMENT  Treatment of esophageal cancer depends on a number of factors, including:   Characteristics  of the cancer cells present.  Tumor size.  Whether the cancer has spread to lymph nodes or to other more distant locations (such as other organs or bone). The types of treatments used for esophageal cancer include:  Surgery to remove as much of the cancer as possible.  Chemotherapy (medicines that kill cancer cells).  Radiation therapy to kill cancer cells.  Combinations of chemotherapy and radiation therapy.  Biological therapy that uses antibodies in ways that can take advantage of the weaknesses of the tumor cells. Your caregivers will also address your needs for pain relief, nutrition, and help with swallowing problems if these develop.  HOME CARE INSTRUCTIONS   Only take over-the-counter or prescription medicines for pain, discomfort or fever as directed by your caregiver.  Maintain a healthy diet. Advice from a nutritionist can be helpful when addressing your specific needs.  Consider joining a support group. This may help you learn to cope with the stress of having esophageal cancer.  Seek advice to help you manage treatment side effects. SEEK MEDICAL CARE IF:  You develop problems with swallowing that are getting worse.  You notice new fatigue or weakness.  You experience unintentional weight loss. SEEK IMMEDIATE MEDICAL CARE IF:  You have a sudden increase in pain.  You have trouble breathing.  You have a fever.  You vomit blood or black material that looks like coffee grounds.  You faint. Document Released: 11/11/2008 Document Revised: 02/21/2012 Document Reviewed: 11/11/2008 Exit[employer] Patient Information 2013 [employer].  

## 2013-01-07 NOTE — Progress Notes (Signed)
                 [address]            [place] [address]          [phone]      Matthew White  Medical Record #[id number] Date of Birth: 09/16/1934  Referring: Dr Jim Edwards Primary Care: RUSSO,JOHN M, MD  Chief Complaint:    Chief Complaint  Patient presents with  . 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 He has had known long segments Barrett's documented since the early 1990's. In 1998 he had Laparoscopic Nissen fundoplication by Dr Marrtin. In fall of 2013 patient had some increased dysaphia ans underwent EGD 11/13, with stricture and inflammation. At that time there was atypia on path but no cancer. Repeat endoscopy was done 12/22/2012. The stricture was patient,  repeat biopsy was done:  Esophagus, biopsy - INVASIVE ADENOCARCINOMA ARISING IN A BACKGROUND OF BARRETT'S MUCOSA CONE [id number].    He notes no weight loss or difficulty swallowing or heating currentlt  Current Activity/ Functional Status: Patient is ndependent with mobility/ambulation, transfers, ADL's, IADL's.  Zobron 0 Past Medical History  Diagnosis Date  . Diabetes mellitus without complication   . GERD (gastroesophageal reflux disease)   . Gout   . Barrett esophagus     Past Surgical History  Procedure Date  . Nissen fundoplication   . Tonsillectomy   . Laryngoscopy   . Esophagogastroduodenoscopy 10/18/2012    Procedure: ESOPHAGOGASTRODUODENOSCOPY (EGD);  Surgeon: James L Edwards Jr., MD;  Location: [facility];  Service: Endoscopy;  Laterality: N/A;  need xray  . Savory dilation 10/18/2012    Procedure: SAVORY DILATION;  Surgeon: James L Edwards Jr., MD;  Location: [facility];  Service: Endoscopy;  Laterality: N/A;  . Esophagogastroduodenoscopy 12/22/2012   Procedure: ESOPHAGOGASTRODUODENOSCOPY (EGD);  Surgeon: James L Edwards Jr., MD;  Location: [facility];  Service: Endoscopy;  Laterality: N/A;  with C-Arm  . Savory dilation 12/22/2012    Procedure: SAVORY DILATION;  Surgeon: James L Edwards Jr., MD;  Location: [facility];  Service: Endoscopy;  Laterality: N/A;    No family history on file. FAther died of pancreatic cancer and dementia at 74, mother died in sleep at 91. Patient has 4 children all healthy Brother had cabg 15 years ago   History   Social History  . Marital Status: Married    Spouse Name: N/A    Number of Children: 4  . Years of Education: N/A   Occupational History  . retired    Social History Main Topics  . Smoking status: Former Smoker -- 1.0 packs/day    Start date: 12/13/1954    Quit date: 10/19/1983  . Smokeless tobacco: Never Used  . Alcohol Use: No  . Drug Use: No  . Sexually Active: Not on file                 History  Smoking status  . Former Smoker -- 1.0 packs/day  . Start date: 12/13/1954  . Quit date: 10/19/1983  Smokeless tobacco  . Never Used    History  Alcohol Use No     Allergies  Allergen Reactions  . Ivp Dye (Iodinated Diagnostic Agents) Swelling    13 hr   prep/reaction was 20 years ago per patient    Current Outpatient Prescriptions  Medication Sig Dispense Refill  . allopurinol (ZYLOPRIM) 100 MG tablet Take 50 mg by mouth daily.      . ALPRAZolam (XANAX) 1 MG tablet Take 1 mg by mouth at bedtime as needed.       . aspirin EC 81 MG tablet Take 81 mg by mouth every evening.      . fish oil-omega-3 fatty acids 1000 MG capsule Take 1 g by mouth daily.      . glimepiride (AMARYL) 2 MG tablet Take 2 mg by mouth daily before breakfast.      . metFORMIN (GLUCOPHAGE) 500 MG tablet Take 500 mg by mouth 2 (two) times daily with a meal.      . omeprazole (PRILOSEC) 20 MG capsule Take 20 mg by mouth 2 (two) times daily.      . simvastatin (ZOCOR) 10 MG tablet Take 10 mg by mouth  at bedtime.           Review of Systems:     Cardiac Review of Systems: Y or N  Chest Pain [  n  ]  Resting SOB [  n ] Exertional SOB  [ n ]  Orthopnea [n  ]   Pedal Edema [  n ]    Palpitations [n  ] Syncope  [ n ]   Presyncope [n   ]  General Review of Systems: [Y] = yes [  ]=no Constitional: recent weight change [ nn ]; anorexia [  ]; fatigue [  ]; nausea [  ]; night sweats [  ]; fever [  ]; or chills [  ];                                                                                                                                          Dental: poor dentition[n  ];  Eye : blurred vision [  ]; diplopia [   ]; vision changes [  ];  Amaurosis fugax[  ]; Resp: cough [n  ];  wheezing[  ];  hemoptysis[  ]; shortness of breath[  ]; paroxysmal nocturnal dyspnea[  ]; dyspnea on exertion[  ]; or orthopnea[  ];  GI:  gallstones[  ], vomiting[  ];  dysphagia[  ]; melena[  ];  hematochezia [  ]; heartburn[  ];   Hx of  Colonoscopy[  ]; GU: kidney stones [  ]; hematuria[  ];   dysuria [  ];  nocturia[  ];  history of     obstruction [  ];             Skin: rash, swelling[  ];, hair loss[  ];  peripheral edema[  ];  or itching[  ]; Musculosketetal: myalgias[  ];  joint swelling[  ];  joint erythema[  ];  joint pain[  ];    back pain[  ];  Heme/Lymph: bruising[  ];  bleeding[  ];  anemia[  ];  Neuro: TIA[  ];  headaches[  ];  stroke[  ];  vertigo[  ];  seizures[  ];   paresthesias[  ];  difficulty walking[  ];  Psych:depression[  ]; anxiety[  ];  Endocrine: diabetes[y  ];  thyroid dysfunction[ n ];  Immunizations: Flu [y  ]; Pneumococcal[ y ];  Other:  Physical Exam: BP 160/90  Pulse 100  Resp 20  Ht 6' 2" (1.88 m)  Wt 210 lb (95.255 kg)  BMI 26.96 kg/m2  SpO2 98%  General appearance: alert, cooperative, appears stated age and no distress Neurologic: intact Heart: regular rate and rhythm, S1, S2 normal, no murmur, click, rub or gallop and normal apical impulse Lungs: clear to  auscultation bilaterally and normal percussion bilaterally Abdomen: soft, non-tender; bowel sounds normal; no masses,  no organomegaly and small port incisions abdomen from Laproscopis surgery Extremities: extremities normal, atraumatic, no cyanosis or edema and Homans sign is negative, no sign of DVT no cevical or axillary adenopathy, no carotid briuts   Diagnostic Studies & Laboratory data:     Recent Radiology Findings:   No results found.  Ct Chest W Contrast  12/29/2012  *RADIOLOGY REPORT*  Clinical Data:  Esophageal carcinoma.  CT CHEST, ABDOMEN AND PELVIS WITH CONTRAST  Technique:  Multidetector CT imaging of the chest, abdomen and pelvis was performed following the standard protocol during bolus administration of intravenous contrast.  Contrast: 100mL OMNIPAQUE IOHEXOL 300 MG/ML  SOLN  Comparison:  none  CT CHEST  Findings:  No axillary or supraclavicular lymphadenopathy.  No mediastinal or hilar lymphadenopathy.  No pericardial fluid.  There is irregular thickening of the distal esophagus over approximately 6 cm segment below the carina and extending to the GE junction (seen best on sagittal projection image 82).  There is  a small paraesophageal lymph node along the thickened esophagus measuring 5 mm short axis (image 41).  Review of the lung windows demonstrates no suspicious pulmonary nodules.  IMPRESSION:  1.  Long segment of mucosal irregularity and thickening in the distal esophagus. 2.  Small paraesophageal lymph node below the carina.  CT ABDOMEN AND PELVIS  Findings:  There is a of 10 mm lymph node in the gastrohepatic ligament (image 54).  The stomach and duodenum are normal. Post fundoplication.  There are diverticula of the sigmoid colon without acute inflammation. Extensive diverticular disease.  The colon and sigmoid colon without acute inflammation.  No enhancing hepatic lesion.  The  gallbladder, pancreas, spleen, adrenal glands normal.  There is a 5.8 cm low attenuation cyst  associated left kidney which has a single enhancing septation which is hair like (image 66).  In the posterior aspect of the right kidney, there is a 8 mm nonenhancing cyst.  There is no free fluid the pelvis.  Prostate gland bladder normal. No pelvic lymphadenopathy. Review of  bone windows demonstrates no aggressive osseous lesions.  IMPRESSION:  1.  Small gastrohepatic ligament lymph node.  Cannot exclude local nodal metastasis. 2.  No evidence of hepatic metastasis. 3.  Mildly complex right renal cyst.  Favor a Bosniak II probable benign renal cysts which requires no specific follow-up.  4.  Severe colonic diverticulosis without diverticulitis.   Original Report Authenticated By: Stewart Edmunds, M.D.    Ct Abdomen Pelvis W Contrast  12/29/2012  *RADIOLOGY REPORT*  Clinical Data:  Esophageal carcinoma.  CT CHEST, ABDOMEN AND PELVIS WITH   CONTRAST  Technique:  Multidetector CT imaging of the chest, abdomen and pelvis was performed following the standard protocol during bolus administration of intravenous contrast.  Contrast: 100mL OMNIPAQUE IOHEXOL 300 MG/ML  SOLN  Comparison:  none  CT CHEST  Findings:  No axillary or supraclavicular lymphadenopathy.  No mediastinal or hilar lymphadenopathy.  No pericardial fluid.  There is irregular thickening of the distal esophagus over approximately 6 cm segment below the carina and extending to the GE junction (seen best on sagittal projection image 82).  There is  a small paraesophageal lymph node along the thickened esophagus measuring 5 mm short axis (image 41).  Review of the lung windows demonstrates no suspicious pulmonary nodules.  IMPRESSION:  1.  Long segment of mucosal irregularity and thickening in the distal esophagus. 2.  Small paraesophageal lymph node below the carina.  CT ABDOMEN AND PELVIS  Findings:  There is a of 10 mm lymph node in the gastrohepatic ligament (image 54).  The stomach and duodenum are normal. Post fundoplication.  There are diverticula  of the sigmoid colon without acute inflammation. Extensive diverticular disease.  The colon and sigmoid colon without acute inflammation.  No enhancing hepatic lesion.  The  gallbladder, pancreas, spleen, adrenal glands normal.  There is a 5.8 cm low attenuation cyst associated left kidney which has a single enhancing septation which is hair like (image 66).  In the posterior aspect of the right kidney, there is a 8 mm nonenhancing cyst.  There is no free fluid the pelvis.  Prostate gland bladder normal. No pelvic lymphadenopathy. Review of  bone windows demonstrates no aggressive osseous lesions.  IMPRESSION:  1.  Small gastrohepatic ligament lymph node.  Cannot exclude local nodal metastasis. 2.  No evidence of hepatic metastasis. 3.  Mildly complex right renal cyst.  Favor a Bosniak II probable benign renal cysts which requires no specific follow-up.  4.  Severe colonic diverticulosis without diverticulitis.   Original Report Authenticated By: Stewart Edmunds, M.D.    Recent Lab Findings: No results found for this basename: WBC, HGB, HCT, PLT, GLUCOSE, CHOL, TRIG, HDL, LDLDIRECT, LDLCALC, ALT, AST, NA, K, CL, CREATININE, BUN, CO2, TSH, INR, GLUF, HGBA1C      Assessment / Plan:   1   INVASIVE ADENOCARCINOMA ARISING IN A BACKGROUND OF BARRETT'S MUCOSA, suspicious gastrohepatic nodes on CT scan  2 Previous history of Nissen fundoplication 3 Diabeties Mellitus   Suggestions/Plan :  1 Further pre op staging of Esophageal Cancer with PET and Esophageal Ultrasound 2 PFT's I have discussed the dx and workup and possible surgical options with the patient in detail. I will see back after esophageal ultrasound is complete and PET is complete. When preop staging complete will also need presentation to GI cancer conference for discussion of preop chemo/ radiation depending on clinical stage  Socorro Kanitz B Mert Dietrick MD  Beeper [phone] Office [phone] 01/07/2013 12:16 PM         

## 2013-01-08 ENCOUNTER — Encounter (HOSPITAL_COMMUNITY): Payer: Self-pay | Admitting: Pharmacy Technician

## 2013-01-10 ENCOUNTER — Encounter (HOSPITAL_COMMUNITY): Payer: Self-pay | Admitting: *Deleted

## 2013-01-10 ENCOUNTER — Encounter (HOSPITAL_COMMUNITY): Payer: Self-pay | Admitting: Anesthesiology

## 2013-01-10 ENCOUNTER — Ambulatory Visit (HOSPITAL_COMMUNITY)
Admission: RE | Admit: 2013-01-10 | Discharge: 2013-01-10 | Disposition: A | Discharge status: Home / Self Care | Payer: Medicare PPO | Source: Ambulatory Visit | Attending: Gastroenterology | Admitting: Gastroenterology

## 2013-01-10 ENCOUNTER — Encounter (HOSPITAL_COMMUNITY)
Admission: RE | Disposition: A | Discharge status: Home / Self Care | Payer: Self-pay | Source: Ambulatory Visit | Attending: Gastroenterology

## 2013-01-10 ENCOUNTER — Ambulatory Visit (HOSPITAL_COMMUNITY): Payer: Medicare PPO | Admitting: Anesthesiology

## 2013-01-10 DIAGNOSIS — Z87891 Personal history of nicotine dependence: Secondary | ICD-10-CM | POA: Insufficient documentation

## 2013-01-10 DIAGNOSIS — Z79899 Other long term (current) drug therapy: Secondary | ICD-10-CM | POA: Insufficient documentation

## 2013-01-10 DIAGNOSIS — K449 Diaphragmatic hernia without obstruction or gangrene: Secondary | ICD-10-CM | POA: Insufficient documentation

## 2013-01-10 DIAGNOSIS — K2270 Barrett's esophagus without dysplasia: Secondary | ICD-10-CM | POA: Insufficient documentation

## 2013-01-10 DIAGNOSIS — C155 Malignant neoplasm of lower third of esophagus: Secondary | ICD-10-CM | POA: Insufficient documentation

## 2013-01-10 DIAGNOSIS — K219 Gastro-esophageal reflux disease without esophagitis: Secondary | ICD-10-CM | POA: Insufficient documentation

## 2013-01-10 DIAGNOSIS — E119 Type 2 diabetes mellitus without complications: Secondary | ICD-10-CM | POA: Insufficient documentation

## 2013-01-10 DIAGNOSIS — K222 Esophageal obstruction: Secondary | ICD-10-CM | POA: Insufficient documentation

## 2013-01-10 DIAGNOSIS — Z800 Family history of malignant neoplasm of digestive organs: Secondary | ICD-10-CM | POA: Insufficient documentation

## 2013-01-10 HISTORY — PX: EUS: SHX5427

## 2013-01-10 SURGERY — ESOPHAGEAL ENDOSCOPIC ULTRASOUND (EUS) RADIAL
Anesthesia: Monitor Anesthesia Care

## 2013-01-10 MED ORDER — FENTANYL CITRATE 0.05 MG/ML IJ SOLN
INTRAMUSCULAR | Status: DC | PRN
  Administered 2013-01-10 (×2): 50 ug via INTRAVENOUS

## 2013-01-10 MED ORDER — BUTAMBEN-TETRACAINE-BENZOCAINE 2-2-14 % EX AERO
INHALATION_SPRAY | CUTANEOUS | Status: DC | PRN
  Administered 2013-01-10: 2 via TOPICAL

## 2013-01-10 MED ORDER — MIDAZOLAM HCL 5 MG/5ML IJ SOLN
INTRAMUSCULAR | Status: DC | PRN
  Administered 2013-01-10: 1 mg via INTRAVENOUS

## 2013-01-10 MED ORDER — LACTATED RINGERS IV SOLN
INTRAVENOUS | Status: DC | PRN
  Administered 2013-01-10: 1000 mL
  Administered 2013-01-10: 10:00:00 via INTRAVENOUS

## 2013-01-10 MED ORDER — PROPOFOL 10 MG/ML IV BOLUS
INTRAVENOUS | Status: DC | PRN
  Administered 2013-01-10 (×2): 25 mg via INTRAVENOUS
  Administered 2013-01-10 (×2): 50 mg via INTRAVENOUS
  Administered 2013-01-10 (×2): 25 mg via INTRAVENOUS

## 2013-01-10 MED ORDER — LACTATED RINGERS IV SOLN: INTRAVENOUS | Status: DC

## 2013-01-10 MED ORDER — SODIUM CHLORIDE 0.9 % IV SOLN: INTRAVENOUS | Status: DC

## 2013-01-10 NOTE — Anesthesia Preprocedure Evaluation (Addendum)
Anesthesia Evaluation  Patient identified by MRN, date of birth, ID band Patient awake  General Assessment Comment:Esophageal Ca  Reviewed: Allergy & Precautions, H&P , NPO status , Patient's Chart, lab work & pertinent test results  Airway Mallampati: II TM Distance: >3 FB Neck ROM: Full    Dental No notable dental hx.    Pulmonary neg pulmonary ROS,  breath sounds clear to auscultation  Pulmonary exam normal       Cardiovascular negative cardio ROS  Rhythm:Regular Rate:Normal     Neuro/Psych negative neurological ROS  negative psych ROS   GI/Hepatic Neg liver ROS, GERD-  ,Barretts esophagus   Endo/Other  diabetes, Oral Hypoglycemic Agents  Renal/GU negative Renal ROS  negative genitourinary   Musculoskeletal negative musculoskeletal ROS (+)   Abdominal   Peds negative pediatric ROS (+)  Hematology negative hematology ROS (+)   Anesthesia Other Findings   Reproductive/Obstetrics negative OB ROS                          Anesthesia Physical Anesthesia Plan  ASA: III  Anesthesia Plan: MAC   Post-op Pain Management:    Induction: Intravenous  Airway Management Planned: Nasal Cannula  Additional Equipment:   Intra-op Plan:   Post-operative Plan:   Informed Consent: I have reviewed the patients History and Physical, chart, labs and discussed the procedure including the risks, benefits and alternatives for the proposed anesthesia with the patient or authorized representative who has indicated his/her understanding and acceptance.     Plan Discussed with: CRNA and Surgeon  Anesthesia Plan Comments:         Anesthesia Quick Evaluation

## 2013-01-10 NOTE — Interval H&P Note (Signed)
History and Physical Interval Note:  01/10/2013 10:06 AM  [name]  has presented today for surgery, with the diagnosis of esoph.cancer  The various methods of treatment have been discussed with the patient and family. After consideration of risks, benefits and other options for treatment, the patient has consented to  Procedure(s) (LRB) with comments: ESOPHAGEAL ENDOSCOPIC ULTRASOUND (EUS) RADIAL (N/A) as a surgical intervention .  The patient's history has been reviewed, patient examined, no change in status, stable for surgery.  I have reviewed the patient's chart and labs.  Questions were answered to the patient's satisfaction.     Nichola Warren M  Assessment:  1.  Barrett's esophagus, now with esophageal adenocarcinoma.  Plan:  1.  Endoscopic ultrasound.  I told patient I might not be able to traverse his malignant stricture with the EUS scope, in which case I will visualize things as best possible above the stricture. 2.  Risks (bleeding, infection, bowel perforation that could require surgery, sedation-related changes in cardiopulmonary systems), benefits (identification and possible treatment of source of symptoms, exclusion of certain causes of symptoms), and alternatives (watchful waiting, radiographic imaging studies, empiric medical treatment) of upper endoscopy with ultrasound (EUS) were explained to patient in detail and he wishes to proceed.

## 2013-01-10 NOTE — Anesthesia Postprocedure Evaluation (Signed)
  Anesthesia Post-op Note  Patient: Matthew White  Procedure(s) Performed: Procedure(s) (LRB): ESOPHAGEAL ENDOSCOPIC ULTRASOUND (EUS) RADIAL (N/A)  Patient Location: PACU  Anesthesia Type: MAC  Level of Consciousness: awake and alert   Airway and Oxygen Therapy: Patient Spontanous Breathing  Post-op Pain: mild  Post-op Assessment: Post-op Vital signs reviewed, Patient's Cardiovascular Status Stable, Respiratory Function Stable, Patent Airway and No signs of Nausea or vomiting  Last Vitals:  Filed Vitals:   01/10/13 1115  BP: 106/64  Pulse:   Temp: 36.4 C  Resp: 12    Post-op Vital Signs: stable   Complications: No apparent anesthesia complications

## 2013-01-10 NOTE — Op Note (Signed)
[facility] [address] [place], [address]   ENDOSCOPIC ULTRASOUND PROCEDURE REPORT  PATIENT: Matthew White, Matthew White  MR#: [id number] BIRTHDATE: Mar 30, 1934  GENDER: Male ENDOSCOPIST: Willis Modena, MD REFERRED BY:  Carman Ching, M.D.  Sheliah Plane, M.D.  Creola Corn, M.D. PROCEDURE DATE:  01/10/2013 PROCEDURE:   Upper EUS ASA CLASS:      Class III INDICATIONS:   1.  esophageal cancer (locoregional staging). MEDICATIONS: MAC sedation, administered by CRNA and Cetacaine spray x 2  DESCRIPTION OF PROCEDURE:   After the risks benefits and alternatives of the procedure were  explained, informed consent was obtained. The patient was then placed in the left, lateral, decubitus postion and IV sedation was administered. Throughout the procedure, the patients blood pressure, pulse and oxygen saturations were monitored continuously.  Under direct visualization, the Pentax EUS Radial [id number]  endoscope was introduced through the mouth  and advanced to the stomach antrum . Water was used as necessary to provide an acoustic interface.  Upon completion of the imaging, water was removed and the patient was sent to the recovery room in satisfactory condition.    FINDINGS:      Radial forward-viewing EUS scope used.  Long-segment confluent Barrett's mucosa,   15cm in length, in mid- to distal esophagus.  In distal esophagus, firm ulcerated stricture seen (prior mucosal biopsies confirmed adenocarcinoma).  Distal to stricture was a 3-4 cm length hiatal hernia.  With time, I was able to traverse the stricture with the radial echoendoscope.  Left lobe of liver showed no lesions; celiac artery difficult to track due to distortion from hiatal hernia, but no obvious celiac adenopathy was seen.    10 mm gastrohepatic lymph node seen, PET-positive on recent imaging.  Multiple hypoechoic, round, well-defined malignant-apeparing periesophageal mediastinal lymph nodes seen, largest  about 8mm in diameter.  Tumor about 1-2 cm in length. Tumor is almost completely circumferential and invades the muscularis propria.  Do not detect any obvious tumor penetration through the muscularis propria.  IMPRESSION:     Distal esophageal adenocarcinoma in setting of long-segment Barrett's mucosa.  Locoregional staging by EUS is T2N1Mx.  Early T3 disease cannot be completely excluded, but is not favored.  RECOMMENDATIONS:     1.  Watch for potential complications of procedure. 2.  Based on PET-CT and EUS findings both showing lymph node involvement, patient would likely benefit from upfront chemoradiative therapy prior to consideration of surgical resection. 3.  Will discuss with Dr. Randa Evens.   _______________________________ Rosalie DoctorWillis Modena, MD 01/10/2013 11:19 AM  CC:

## 2013-01-10 NOTE — Transfer of Care (Signed)
Immediate Anesthesia Transfer of Care Note  Patient: Matthew White  Procedure(s) Performed: Procedure(s) (LRB) with comments: ESOPHAGEAL ENDOSCOPIC ULTRASOUND (EUS) RADIAL (N/A)  Patient Location: PACU  Anesthesia Type:MAC  Level of Consciousness: awake, sedated and patient cooperative  Airway & Oxygen Therapy: Patient Spontanous Breathing and Patient connected to nasal cannula oxygen  Post-op Assessment: Report given to PACU RN and Post -op Vital signs reviewed and stable  Post vital signs: Reviewed and stable  Complications: No apparent anesthesia complications

## 2013-01-10 NOTE — H&P (View-Only) (Signed)
[address]            [place] [address]          [phone]      Matthew White [facility] Medical Record #[id number] Date of Birth: Oct 07, 1934  Referring: Dr Vilinda Boehringer Primary Care: Gwen Pounds, MD  Chief Complaint:    Chief Complaint  Patient presents with  . 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 He has had known long segments Barrett's documented since the early 1990's. In 1998 he had Laparoscopic Nissen fundoplication by Dr Jacquiline Doe. In fall of 2013 patient had some increased dysaphia ans underwent EGD 11/13, with stricture and inflammation. At that time there was atypia on path but no cancer. Repeat endoscopy was done 12/22/2012. The stricture was patient,  repeat biopsy was done:  Esophagus, biopsy - INVASIVE ADENOCARCINOMA ARISING IN A BACKGROUND OF BARRETT'S MUCOSA CONE [id number].    He notes no weight loss or difficulty swallowing or heating currentlt  Current Activity/ Functional Status: Patient is ndependent with mobility/ambulation, transfers, ADL's, IADL's.  Zobron 0 Past Medical History  Diagnosis Date  . Diabetes mellitus without complication   . GERD (gastroesophageal reflux disease)   . Gout   . Barrett esophagus     Past Surgical History  Procedure Date  . Nissen fundoplication   . Tonsillectomy   . Laryngoscopy   . Esophagogastroduodenoscopy 10/18/2012    Procedure: ESOPHAGOGASTRODUODENOSCOPY (EGD);  Surgeon: Vertell Novak., MD;  Location: [facility];  Service: Endoscopy;  Laterality: N/A;  need xray  . Savory dilation 10/18/2012    Procedure: SAVORY DILATION;  Surgeon: Vertell Novak., MD;  Location: [facility];  Service: Endoscopy;  Laterality: N/A;  . Esophagogastroduodenoscopy 12/22/2012   Procedure: ESOPHAGOGASTRODUODENOSCOPY (EGD);  Surgeon: Vertell Novak., MD;  Location: [facility];  Service: Endoscopy;  Laterality: N/A;  with C-Arm  . Savory dilation 12/22/2012    Procedure: SAVORY DILATION;  Surgeon: Vertell Novak., MD;  Location: [facility];  Service: Endoscopy;  Laterality: N/A;    No family history on file. FAther died of pancreatic cancer and dementia at 19, mother died in sleep at 48. Patient has 4 children all healthy Brother had cabg 15 years ago   History   Social History  . Marital Status: Married    Spouse Name: N/A    Number of Children: 4  . Years of Education: N/A   Occupational History  . retired    Social History Main Topics  . Smoking status: Former Smoker -- 1.0 packs/day    Start date: 12/13/1954    Quit date: 10/19/1983  . Smokeless tobacco: Never Used  . Alcohol Use: No  . Drug Use: No  . Sexually Active: Not on file                 History  Smoking status  . Former Smoker -- 1.0 packs/day  . Start date: 12/13/1954  . Quit date: 10/19/1983  Smokeless tobacco  . Never Used    History  Alcohol Use No     Allergies  Allergen Reactions  . Ivp Dye (Iodinated Diagnostic Agents) Swelling    13 hr  prep/reaction was 20 years ago per patient    Current Outpatient Prescriptions  Medication Sig Dispense Refill  . allopurinol (ZYLOPRIM) 100 MG tablet Take 50 mg by mouth daily.      Marland Kitchen ALPRAZolam (XANAX) 1 MG tablet Take 1 mg by mouth at bedtime as needed.       Marland Kitchen aspirin EC 81 MG tablet Take 81 mg by mouth every evening.      . fish oil-omega-3 fatty acids 1000 MG capsule Take 1 g by mouth daily.      Marland Kitchen glimepiride (AMARYL) 2 MG tablet Take 2 mg by mouth daily before breakfast.      . metFORMIN (GLUCOPHAGE) 500 MG tablet Take 500 mg by mouth 2 (two) times daily with a meal.      . omeprazole (PRILOSEC) 20 MG capsule Take 20 mg by mouth 2 (two) times daily.      . simvastatin (ZOCOR) 10 MG tablet Take 10 mg by mouth  at bedtime.           Review of Systems:     Cardiac Review of Systems: Y or N  Chest Pain [  n  ]  Resting SOB [  n ] Exertional SOB  [ n ]  Orthopnea [n  ]   Pedal Edema [  n ]    Palpitations [n  ] Syncope  [ n ]   Presyncope [n   ]  General Review of Systems: [Y] = yes [  ]=no Constitional: recent weight change [ nn ]; anorexia [  ]; fatigue [  ]; nausea [  ]; night sweats [  ]; fever [  ]; or chills [  ];                                                                                                                                          Dental: poor dentition[n  ];  Eye : blurred vision [  ]; diplopia [   ]; vision changes [  ];  Amaurosis fugax[  ]; Resp: cough [n  ];  wheezing[  ];  hemoptysis[  ]; shortness of breath[  ]; paroxysmal nocturnal dyspnea[  ]; dyspnea on exertion[  ]; or orthopnea[  ];  GI:  gallstones[  ], vomiting[  ];  dysphagia[  ]; melena[  ];  hematochezia [  ]; heartburn[  ];   Hx of  Colonoscopy[  ]; GU: kidney stones [  ]; hematuria[  ];   dysuria [  ];  nocturia[  ];  history of     obstruction [  ];             Skin: rash, swelling[  ];, hair loss[  ];  peripheral edema[  ];  or itching[  ]; Musculosketetal: myalgias[  ];  joint swelling[  ];  joint erythema[  ];  joint pain[  ];  back pain[  ];  Heme/Lymph: bruising[  ];  bleeding[  ];  anemia[  ];  Neuro: TIA[  ];  headaches[  ];  stroke[  ];  vertigo[  ];  seizures[  ];   paresthesias[  ];  difficulty walking[  ];  Psych:depression[  ]; anxiety[  ];  Endocrine: diabetes[y  ];  thyroid dysfunction[ n ];  Immunizations: Flu Cove.Etienne  ]; Pneumococcal[ y ];  Other:  Physical Exam: BP 160/90  Pulse 100  Resp 20  Ht 6\' 2"  (1.88 m)  Wt 210 lb (95.255 kg)  BMI 26.96 kg/m2  SpO2 98%  General appearance: alert, cooperative, appears stated age and no distress Neurologic: intact Heart: regular rate and rhythm, S1, S2 normal, no murmur, click, rub or gallop and normal apical impulse Lungs: clear to  auscultation bilaterally and normal percussion bilaterally Abdomen: soft, non-tender; bowel sounds normal; no masses,  no organomegaly and small port incisions abdomen from Laproscopis surgery Extremities: extremities normal, atraumatic, no cyanosis or edema and Homans sign is negative, no sign of DVT no cevical or axillary adenopathy, no carotid briuts   Diagnostic Studies & Laboratory data:     Recent Radiology Findings:   No results found.  Ct Chest W Contrast  12/29/2012  *RADIOLOGY REPORT*  Clinical Data:  Esophageal carcinoma.  CT CHEST, ABDOMEN AND PELVIS WITH CONTRAST  Technique:  Multidetector CT imaging of the chest, abdomen and pelvis was performed following the standard protocol during bolus administration of intravenous contrast.  Contrast: OMNIPAQUE IOHEXOL 300 MG/ML  SOLN  Comparison:  none  CT CHEST  Findings:  No axillary or supraclavicular lymphadenopathy.  No mediastinal or hilar lymphadenopathy.  No pericardial fluid.  There is irregular thickening of the distal esophagus over approximately 6 cm segment below the carina and extending to the GE junction (seen best on sagittal projection image 82).  There is  a small paraesophageal lymph node along the thickened esophagus measuring 5 mm short axis (image 41).  Review of the lung windows demonstrates no suspicious pulmonary nodules.  IMPRESSION:  1.  Long segment of mucosal irregularity and thickening in the distal esophagus. 2.  Small paraesophageal lymph node below the carina.  CT ABDOMEN AND PELVIS  Findings:  There is a of 10 mm lymph node in the gastrohepatic ligament (image 54).  The stomach and duodenum are normal. Post fundoplication.  There are diverticula of the sigmoid colon without acute inflammation. Extensive diverticular disease.  The colon and sigmoid colon without acute inflammation.  No enhancing hepatic lesion.  The  gallbladder, pancreas, spleen, adrenal glands normal.  There is a 5.8 cm low attenuation cyst  associated left kidney which has a single enhancing septation which is hair like (image 66).  In the posterior aspect of the right kidney, there is a 8 mm nonenhancing cyst.  There is no free fluid the pelvis.  Prostate gland bladder normal. No pelvic lymphadenopathy. Review of  bone windows demonstrates no aggressive osseous lesions.  IMPRESSION:  1.  Small gastrohepatic ligament lymph node.  Cannot exclude local nodal metastasis. 2.  No evidence of hepatic metastasis. 3.  Mildly complex right renal cyst.  Favor a Bosniak II probable benign renal cysts which requires no specific follow-up.  4.  Severe colonic diverticulosis without diverticulitis.   Original Report Authenticated By: Genevive Bi, M.D.    Ct Abdomen Pelvis W Contrast  12/29/2012  *RADIOLOGY REPORT*  Clinical Data:  Esophageal carcinoma.  CT CHEST, ABDOMEN AND PELVIS WITH  CONTRAST  Technique:  Multidetector CT imaging of the chest, abdomen and pelvis was performed following the standard protocol during bolus administration of intravenous contrast.  Contrast: OMNIPAQUE IOHEXOL 300 MG/ML  SOLN  Comparison:  none  CT CHEST  Findings:  No axillary or supraclavicular lymphadenopathy.  No mediastinal or hilar lymphadenopathy.  No pericardial fluid.  There is irregular thickening of the distal esophagus over approximately 6 cm segment below the carina and extending to the GE junction (seen best on sagittal projection image 82).  There is  a small paraesophageal lymph node along the thickened esophagus measuring 5 mm short axis (image 41).  Review of the lung windows demonstrates no suspicious pulmonary nodules.  IMPRESSION:  1.  Long segment of mucosal irregularity and thickening in the distal esophagus. 2.  Small paraesophageal lymph node below the carina.  CT ABDOMEN AND PELVIS  Findings:  There is a of 10 mm lymph node in the gastrohepatic ligament (image 54).  The stomach and duodenum are normal. Post fundoplication.  There are diverticula  of the sigmoid colon without acute inflammation. Extensive diverticular disease.  The colon and sigmoid colon without acute inflammation.  No enhancing hepatic lesion.  The  gallbladder, pancreas, spleen, adrenal glands normal.  There is a 5.8 cm low attenuation cyst associated left kidney which has a single enhancing septation which is hair like (image 66).  In the posterior aspect of the right kidney, there is a 8 mm nonenhancing cyst.  There is no free fluid the pelvis.  Prostate gland bladder normal. No pelvic lymphadenopathy. Review of  bone windows demonstrates no aggressive osseous lesions.  IMPRESSION:  1.  Small gastrohepatic ligament lymph node.  Cannot exclude local nodal metastasis. 2.  No evidence of hepatic metastasis. 3.  Mildly complex right renal cyst.  Favor a Bosniak II probable benign renal cysts which requires no specific follow-up.  4.  Severe colonic diverticulosis without diverticulitis.   Original Report Authenticated By: Genevive Bi, M.D.    Recent Lab Findings: No results found for this basename: WBC, HGB, HCT, PLT, GLUCOSE, CHOL, TRIG, HDL, LDLDIRECT, LDLCALC, ALT, AST, NA, K, CL, CREATININE, BUN, CO2, TSH, INR, GLUF, HGBA1C      Assessment / Plan:   1   INVASIVE ADENOCARCINOMA ARISING IN A BACKGROUND OF BARRETT'S MUCOSA, suspicious gastrohepatic nodes on CT scan  2 Previous history of Nissen fundoplication 3 Diabeties Mellitus   Suggestions/Plan :  1 Further pre op staging of Esophageal Cancer with PET and Esophageal Ultrasound 2 PFT's I have discussed the dx and workup and possible surgical options with the patient in detail. I will see back after esophageal ultrasound is complete and PET is complete. When preop staging complete will also need presentation to GI cancer conference for discussion of preop chemo/ radiation depending on clinical stage  Delight Ovens MD  Beeper [phone] Office [phone] 01/07/2013 12:16 PM

## 2013-01-11 ENCOUNTER — Ambulatory Visit (INDEPENDENT_AMBULATORY_CARE_PROVIDER_SITE_OTHER): Payer: Medicare PPO | Admitting: Cardiothoracic Surgery

## 2013-01-11 ENCOUNTER — Encounter (HOSPITAL_COMMUNITY): Payer: Self-pay | Admitting: Gastroenterology

## 2013-01-11 VITALS — BP 120/80 | HR 98 | Resp 16 | Ht 74.0 in | Wt 210.0 lb

## 2013-01-11 DIAGNOSIS — C159 Malignant neoplasm of esophagus, unspecified: Secondary | ICD-10-CM

## 2013-01-12 ENCOUNTER — Encounter: Payer: Self-pay | Admitting: Radiation Oncology

## 2013-01-12 DIAGNOSIS — C155 Malignant neoplasm of lower third of esophagus: Secondary | ICD-10-CM | POA: Insufficient documentation

## 2013-01-12 DIAGNOSIS — C159 Malignant neoplasm of esophagus, unspecified: Secondary | ICD-10-CM

## 2013-01-12 NOTE — Progress Notes (Signed)
80 year old. Male. Married.  Invasive adenocarcinoma arising in a background of barrett's mucosa. Per 01/04/2013 PET there is evidence of local nodal metastasis with hypermetabolic gastroheptic ligament lymph node.  Chemoradiative therapy prior to consideration of surgical resection recommended by Dr. Dulce Sellar.  AX: IVP dye No hx of radiation therapy No indication of a pacemaker

## 2013-01-12 NOTE — Progress Notes (Signed)
[address]            [place] [address]          [phone]       MARKEEM NOREEN [facility] Medical Record #[id number] Date of Birth: June 24, 1934  Referring: Dr Vilinda Boehringer Primary Care: Gwen Pounds, MD  Chief Complaint:    Chief Complaint  Patient presents with  . Esophageal Cancer    f/u with PFT'S and  EUS 01/10/13 by Dr. 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 He has had known long segments Barrett's documented since the early 1990's. In 1998 he had Laparoscopic Nissen fundoplication by Dr Daphine Deutscher. In fall of 2013 patient had some increased dysaphia ans underwent EGD 11/13, with stricture and inflammation. At that time there was atypia on path but no cancer. Repeat endoscopy was done 12/22/2012. The stricture was patient,  repeat biopsy was done:  Esophagus, biopsy - INVASIVE ADENOCARCINOMA ARISING IN A BACKGROUND OF BARRETT'S MUCOSA CONE [id number].  He notes no weight loss or difficulty swallowing.   Since he was seen last week he's had pulmonary function studies,PET scan, and EUS completed to further stage his invasive adenocarcinoma  Current Activity/ Functional Status: Patient is ndependent with mobility/ambulation, transfers, ADL's, IADL's.  Zobron 0   Past Medical History  Diagnosis Date  . Diabetes mellitus without complication   . GERD (gastroesophageal reflux disease)   . Gout   . Barrett esophagus   . Esophageal cancer 12/22/2012    invasive adenocarcinoma arising in a background of barrett's mucosa    Past Surgical History  Procedure Date  . Nissen fundoplication   . Tonsillectomy   . Laryngoscopy   . Esophagogastroduodenoscopy 10/18/2012    Procedure: ESOPHAGOGASTRODUODENOSCOPY (EGD);  Surgeon: Vertell Novak., MD;  Location: [facility];  Service: Endoscopy;  Laterality: N/A;  need xray  . Savory dilation 10/18/2012    Procedure:  SAVORY DILATION;  Surgeon: Vertell Novak., MD;  Location: [facility];  Service: Endoscopy;  Laterality: N/A;  . Esophagogastroduodenoscopy 12/22/2012    Procedure: ESOPHAGOGASTRODUODENOSCOPY (EGD);  Surgeon: Vertell Novak., MD;  Location: [facility];  Service: Endoscopy;  Laterality: N/A;  with C-Arm  . Savory dilation 12/22/2012    Procedure: SAVORY DILATION;  Surgeon: Vertell Novak., MD;  Location: [facility];  Service: Endoscopy;  Laterality: N/A;  . Eus 01/10/2013    Procedure: ESOPHAGEAL ENDOSCOPIC ULTRASOUND (EUS) RADIAL;  Surgeon: Willis Modena, MD;  Location: [facility];  Service: Endoscopy;  Laterality: N/A;    Family History  Problem Relation Age of Onset  . Cancer Father     pancreatic cancer  . Dementia Father    FAther died of pancreatic cancer and dementia at 44, mother died in sleep at 46. Patient has 4 children all healthy Brother had cabg 15 years ago   History   Social History  . Marital Status: Married    Spouse Name: N/A    Number of Children: 4  . Years of Education: N/A   Occupational History  . retired    Social History Main Topics  . Smoking status: Former Smoker -- 1.0 packs/day    Start date: 12/13/1954    Quit date: 10/19/1983  .  Smokeless tobacco: Never Used  . Alcohol Use: No  . Drug Use: No        History  Smoking status  . Former Smoker -- 1.0 packs/day  . Start date: 12/13/1954  . Quit date: 10/19/1983  Smokeless tobacco  . Never Used    History  Alcohol Use No     Allergies  Allergen Reactions  . Ivp Dye (Iodinated Diagnostic Agents) Swelling    13 hr prep/reaction was 20 years ago per patient    Current Outpatient Prescriptions  Medication Sig Dispense Refill  . allopurinol (ZYLOPRIM) 100 MG tablet Take 50 mg by mouth daily.      Marland Kitchen ALPRAZolam (XANAX) 1 MG tablet Take 1 mg by mouth at bedtime as needed. For sleep      . glimepiride (AMARYL) 2 MG tablet Take 2 mg by mouth daily before breakfast.      .  metFORMIN (GLUCOPHAGE) 500 MG tablet Take 500 mg by mouth 2 (two) times daily with a meal.      . omeprazole (PRILOSEC) 20 MG capsule Take 20 mg by mouth 2 (two) times daily.      . simvastatin (ZOCOR) 10 MG tablet Take 10 mg by mouth at bedtime.      . sucralfate (CARAFATE) 1 GM/10ML suspension Take 1 g by mouth 3 (three) times daily after meals.           Review of Systems:     Cardiac Review of Systems: Y or N  Chest Pain [  n  ]  Resting SOB [  n ] Exertional SOB  [ n ]  Orthopnea [n  ]   Pedal Edema [  n ]    Palpitations [n  ] Syncope  [ n ]   Presyncope [n   ]  General Review of Systems: [Y] = yes [  ]=no Constitional: recent weight change [ nn ]; anorexia [  ]; fatigue [  ]; nausea [  ]; night sweats [  ]; fever [  ]; or chills [  ];                                                                                                                                          Dental: poor dentition[n  ];  Eye : blurred vision [  ]; diplopia [   ]; vision changes [  ];  Amaurosis fugax[  ]; Resp: cough [n  ];  wheezing[  ];  hemoptysis[  ]; shortness of breath[  ]; paroxysmal nocturnal dyspnea[  ]; dyspnea on exertion[  ]; or orthopnea[  ];  GI:  gallstones[  ], vomiting[  ];  dysphagia[  ]; melena[  ];  hematochezia [  ]; heartburn[  ];   Hx of  Colonoscopy[  ]; GU: kidney stones [  ]; hematuria[  ];   dysuria [  ];  nocturia[  ];  history of     obstruction [  ];             Skin: rash, swelling[  ];, hair loss[  ];  peripheral edema[  ];  or itching[  ]; Musculosketetal: myalgias[  ];  joint swelling[  ];  joint erythema[  ];  joint pain[  ];  back pain[  ];  Heme/Lymph: bruising[  ];  bleeding[  ];  anemia[  ];  Neuro: TIA[  ];  headaches[  ];  stroke[  ];  vertigo[  ];  seizures[  ];   paresthesias[  ];  difficulty walking[  ];  Psych:depression[  ]; anxiety[  ];  Endocrine: diabetes[y  ];  thyroid dysfunction[ n ];  Immunizations: Flu Cove.Etienne  ]; Pneumococcal[ y ];  Other:  Physical  Exam: BP 120/80  Pulse 98  Resp 16  Ht 6\' 2"  (1.88 m)  Wt 210 lb (95.255 kg)  BMI 26.96 kg/m2  SpO2 97%  General appearance: alert, cooperative, appears stated age and no distress Neurologic: intact Heart: regular rate and rhythm, S1, S2 normal, no murmur, click, rub or gallop and normal apical impulse Lungs: clear to auscultation bilaterally and normal percussion bilaterally Abdomen: soft, non-tender; bowel sounds normal; no masses,  no organomegaly and small port incisions abdomen from Laproscopis surgery Extremities: extremities normal, atraumatic, no cyanosis or edema and Homans sign is negative, no sign of DVT no cevical or axillary adenopathy, no carotid briuts   Diagnostic Studies & Laboratory data:     Recent Radiology Findings:  Ct Chest W Contrast  12/29/2012  *RADIOLOGY REPORT*  Clinical Data:  Esophageal carcinoma.  CT CHEST, ABDOMEN AND PELVIS WITH CONTRAST  Technique:  Multidetector CT imaging of the chest, abdomen and pelvis was performed following the standard protocol during bolus administration of intravenous contrast.  Contrast: OMNIPAQUE IOHEXOL 300 MG/ML  SOLN  Comparison:  none  CT CHEST  Findings:  No axillary or supraclavicular lymphadenopathy.  No mediastinal or hilar lymphadenopathy.  No pericardial fluid.  There is irregular thickening of the distal esophagus over approximately 6 cm segment below the carina and extending to the GE junction (seen best on sagittal projection image 82).  There is  a small paraesophageal lymph node along the thickened esophagus measuring 5 mm short axis (image 41).  Review of the lung windows demonstrates no suspicious pulmonary nodules.  IMPRESSION:  1.  Long segment of mucosal irregularity and thickening in the distal esophagus. 2.  Small paraesophageal lymph node below the carina.  CT ABDOMEN AND PELVIS  Findings:  There is a of 10 mm lymph node in the gastrohepatic ligament (image 54).  The stomach and duodenum are normal. Post  fundoplication.  There are diverticula of the sigmoid colon without acute inflammation. Extensive diverticular disease.  The colon and sigmoid colon without acute inflammation.  No enhancing hepatic lesion.  The  gallbladder, pancreas, spleen, adrenal glands normal.  There is a 5.8 cm low attenuation cyst associated left kidney which has a single enhancing septation which is hair like (image 66).  In the posterior aspect of the right kidney, there is a 8 mm nonenhancing cyst.  There is no free fluid the pelvis.  Prostate gland bladder normal. No pelvic lymphadenopathy. Review of  bone windows demonstrates no aggressive osseous lesions.  IMPRESSION:  1.  Small gastrohepatic ligament lymph node.  Cannot exclude local nodal metastasis. 2.  No evidence of hepatic metastasis. 3.  Mildly complex  right renal cyst.  Favor a Bosniak II probable benign renal cysts which requires no specific follow-up.  4.  Severe colonic diverticulosis without diverticulitis.   Original Report Authenticated By: Genevive Bi, M.D.    Ct Abdomen Pelvis W Contrast  12/29/2012  *RADIOLOGY REPORT*  Clinical Data:  Esophageal carcinoma.  CT CHEST, ABDOMEN AND PELVIS WITH CONTRAST  Technique:  Multidetector CT imaging of the chest, abdomen and pelvis was performed following the standard protocol during bolus administration of intravenous contrast.  Contrast: OMNIPAQUE IOHEXOL 300 MG/ML  SOLN  Comparison:  none  CT CHEST  Findings:  No axillary or supraclavicular lymphadenopathy.  No mediastinal or hilar lymphadenopathy.  No pericardial fluid.  There is irregular thickening of the distal esophagus over approximately 6 cm segment below the carina and extending to the GE junction (seen best on sagittal projection image 82).  There is  a small paraesophageal lymph node along the thickened esophagus measuring 5 mm short axis (image 41).  Review of the lung windows demonstrates no suspicious pulmonary nodules.  IMPRESSION:  1.  Long segment of  mucosal irregularity and thickening in the distal esophagus. 2.  Small paraesophageal lymph node below the carina.  CT ABDOMEN AND PELVIS  Findings:  There is a of 10 mm lymph node in the gastrohepatic ligament (image 54).  The stomach and duodenum are normal. Post fundoplication.  There are diverticula of the sigmoid colon without acute inflammation. Extensive diverticular disease.  The colon and sigmoid colon without acute inflammation.  No enhancing hepatic lesion.  The  gallbladder, pancreas, spleen, adrenal glands normal.  There is a 5.8 cm low attenuation cyst associated left kidney which has a single enhancing septation which is hair like (image 66).  In the posterior aspect of the right kidney, there is a 8 mm nonenhancing cyst.  There is no free fluid the pelvis.  Prostate gland bladder normal. No pelvic lymphadenopathy. Review of  bone windows demonstrates no aggressive osseous lesions.  IMPRESSION:  1.  Small gastrohepatic ligament lymph node.  Cannot exclude local nodal metastasis. 2.  No evidence of hepatic metastasis. 3.  Mildly complex right renal cyst.  Favor a Bosniak II probable benign renal cysts which requires no specific follow-up.  4.  Severe colonic diverticulosis without diverticulitis.   Original Report Authenticated By: Genevive Bi, M.D.    Nm Pet Image Initial (pi) Skull Base To Thigh  01/04/2013  *RADIOLOGY REPORT*  Clinical Data: Initial treatment strategy for esophageal carcinoma.  NUCLEAR MEDICINE PET SKULL BASE TO THIGH  Fasting Blood Glucose:  164  Technique:  19.3 mCi F-18 FDG was injected intravenously. CT data was obtained and used for attenuation correction and anatomic localization only.  (This was not acquired as a diagnostic CT examination.) Additional exam technical data entered on technologist worksheet.  Comparison:  Chest CT 12/29/2012  Findings:  Neck: No hypermetabolic nodes in the neck.  Chest:  There is a long segment of hypermetabolic activity within the distal  third esophagus beginning below the carina with SUV max = 6.8.  This is associated with circumferential esophageal thickening seen on the CT scanning  and comparison CT scan. There are several small para esophageal lymph nodes which do not have clear metabolic activity but are very small in size including lymph node on image 113 measuring 7 mm.  No mediastinal lymphadenopathy identified.  No suspicious pulmonary nodules.  Abdomen/Pelvis:  There is hypermetabolic gastrohepatic ligament lymph node measuring 9 mm (image 130) with SUV max =  7.2.  No additional hypermetabolic lymph nodes are present.  There is no abnormal hypermetabolic activity within the liver.  Physiologic activity noted within the gastric antrum and colon.  No evidence of pelvic lymphadenopathy.  Skeleton:  No focal hypermetabolic activity to suggest skeletal metastasis.  IMPRESSION:  1.  Hypermetabolic thickening through the distal esophagus consistent primary esophageal carcinoma. 2.  Evidence of local nodal metastasis with hypermetabolic gastrohepatic ligament lymph node.  T  3.   No evidence of liver metastasis. 4.  No evidence of systemic disease.   Original Report Authenticated By: Genevive Bi, M.D.    EUS: done by Dr Dulce Sellar [id number] PFTS FEV1 3.8 112%   DLCO 29.47 81%  Recent Lab Findings: No results found for this basename: WBC,  HGB,  HCT,  PLT,  GLUCOSE,  CHOL,  TRIG,  HDL,  LDLDIRECT,  LDLCALC,  ALT,  AST,  NA,  K,  CL,  CREATININE,  BUN,  CO2,  TSH,  INR,  GLUF,  HGBA1C      Assessment / Plan:   1   INVASIVE ADENOCARCINOMA ARISING IN A BACKGROUND OF BARRETT'S MUCOSA, suspicious gastrohepatic nodes on CT scan , u T2N1 disease  2 Previous history of Nissen fundoplication 3 Diabeties Mellitus   Suggestions/Plan :  With more advanced disease as staged by EUS and PET would benefit from preop CHEM and Radiation I have discussed the findings of EUS and PET scan with the patient and reviewed the staging and thought behind  preoperative radiation and chemotherapy. Dr. Roselind Messier will see the patient today. The patient and his wife had requested to see Dr. Truett Perna which we will arrange. We'll plan to see him back in 3-4 weeks to see how this treatment plan is progressing, with consideration for surgical resection 4-6 weeks after the completion of radiation and restaging.    Delight Ovens MD  Beeper [phone] Office [phone] 01/12/2013 5:54 PM

## 2013-01-15 ENCOUNTER — Ambulatory Visit
Admission: RE | Admit: 2013-01-15 | Discharge: 2013-01-15 | Disposition: A | Discharge status: Home / Self Care | Payer: Medicare PPO | Source: Ambulatory Visit | Attending: Radiation Oncology | Admitting: Radiation Oncology

## 2013-01-15 ENCOUNTER — Telehealth: Payer: Self-pay | Admitting: *Deleted

## 2013-01-15 ENCOUNTER — Encounter: Payer: Self-pay | Admitting: Radiation Oncology

## 2013-01-15 VITALS — BP 130/77 | HR 87 | Temp 98.4°F | Resp 16 | Ht 73.0 in | Wt 210.0 lb

## 2013-01-15 VITALS — Resp 16 | Wt 216.5 lb

## 2013-01-15 DIAGNOSIS — E119 Type 2 diabetes mellitus without complications: Secondary | ICD-10-CM | POA: Insufficient documentation

## 2013-01-15 DIAGNOSIS — C159 Malignant neoplasm of esophagus, unspecified: Secondary | ICD-10-CM

## 2013-01-15 DIAGNOSIS — Z79899 Other long term (current) drug therapy: Secondary | ICD-10-CM | POA: Insufficient documentation

## 2013-01-15 DIAGNOSIS — K219 Gastro-esophageal reflux disease without esophagitis: Secondary | ICD-10-CM | POA: Insufficient documentation

## 2013-01-15 DIAGNOSIS — C155 Malignant neoplasm of lower third of esophagus: Secondary | ICD-10-CM | POA: Insufficient documentation

## 2013-01-15 MED ORDER — SUCRALFATE 1 GM/10ML PO SUSP: 1.0000 g | Freq: Four times a day (QID) | ORAL | Status: DC

## 2013-01-15 NOTE — Telephone Encounter (Signed)
Spoke with patient and confirmed appointment with Dr. Truett Perna for 01/22/13.  Contact name and phone provided.

## 2013-01-15 NOTE — Progress Notes (Signed)
Patient presents to the clinic today accompanied by his wife for a consultation with Dr. Roselind Messier to discuss the role of radiation therapy in the treatment of of invasive adenocarcinoma arising in a background of barrett's mucosa. Patient is alert and oriented to person, place, and time. No distress noted. Steady gait noted. Pleasant affect noted. Patient denies pain at this time. History of heartburn since childhood. Hx of need for esophageal dilation each year. No dry mouth during the day. No thick rope like saliva. Patient denies hoarseness. Full upper dentures and partial lowers. Patient denies nausea, vomiting, headache, dizziness, or headache. Patient reports occasional diarrhea related to diverticulitis. Patient reports that he is asymptomatic. Patient reports cancer was found during dilation procedure.  Reported all findings to Dr. Roselind Messier.

## 2013-01-15 NOTE — Progress Notes (Signed)
Radiation Oncology         [phone] ________________________________  Initial outpatient Consultation  Name: Matthew White MRN: [id number]  Date: 01/15/2013  DOB: [birth year]  WU:[id number],JYNW Judie Petit, MD  Delight Ovens, MD   REFERRING PHYSICIAN: Delight Ovens, MD  DIAGNOSIS: The encounter diagnosis was Esophageal cancer.  HISTORY OF PRESENT ILLNESS::Matthew White is a 79 y.o. male who is seen out of the courtesy of Dr. Ofilia Neas for an opinion concerning radiation therapy as part of management of patient's locally advanced esophageal cancer.  Patient has had a long history of Barrett's esophagus. He is been followed closely by Dr. Vilinda Boehringer.  In 1988 patient did undergo a laparoscopic Nissen fundoplication by Dr. Wenda Low. Last fall the patient presented with increasing dysphasia. An EGD was done 10/25/2012 showed some stricturing and inflammation. A biopsy at that time showed atypia but no malignancy. He underwent repeat EGD on January 10. A repeat biopsy however at this time did show adenocarcinoma arising in the background of Barrett's mucosa.  The patient proceeded to undergo upper endoscopic ultrasound. The patient was noted to have a long segment of confluence of Barrett's mucosa extending over approximately 15 cm in length. This is located in the mid to distal esophagus. In the distal esophagus there was a firm ulcerated stricture. Distal to the stricture was a 3-4 cm length of hiatal hernia. There was noted to have a 10 mm gastric hepatic lymph node. Multiple hypoechoic round well-defined malignant-appearing periesophageal mediastinal lymph nodes were also seen. Largest was 8 mm diameter. The tumor was estimated to be approximately 1-2 cm in length and was almost completely circumferential and invaded the muscularis propria.  EUS  stage of T2 N1 possibly early T3.  PREVIOUS RADIATION THERAPY: No  PAST MEDICAL HISTORY:  has a past medical history of Gout; Barrett esophagus;  Esophageal cancer (12/22/2012); Diabetes mellitus without complication; and GERD (gastroesophageal reflux disease).    PAST SURGICAL HISTORY: Past Surgical History  Procedure Date  . Nissen fundoplication   . Tonsillectomy   . Laryngoscopy   . Esophagogastroduodenoscopy 10/18/2012    Procedure: ESOPHAGOGASTRODUODENOSCOPY (EGD);  Surgeon: Vertell Novak., MD;  Location: [facility];  Service: Endoscopy;  Laterality: N/A;  need xray  . Savory dilation 10/18/2012    Procedure: SAVORY DILATION;  Surgeon: Vertell Novak., MD;  Location: [facility];  Service: Endoscopy;  Laterality: N/A;  . Esophagogastroduodenoscopy 12/22/2012    Procedure: ESOPHAGOGASTRODUODENOSCOPY (EGD);  Surgeon: Vertell Novak., MD;  Location: [facility];  Service: Endoscopy;  Laterality: N/A;  with C-Arm  . Savory dilation 12/22/2012    Procedure: SAVORY DILATION;  Surgeon: Vertell Novak., MD;  Location: [facility];  Service: Endoscopy;  Laterality: N/A;  . Eus 01/10/2013    Procedure: ESOPHAGEAL ENDOSCOPIC ULTRASOUND (EUS) RADIAL;  Surgeon: Willis Modena, MD;  Location: [facility];  Service: Endoscopy;  Laterality: N/A;  . Removal of breast tissue     FAMILY HISTORY: family history includes Cancer in his father and mother and Dementia in his father.  SOCIAL HISTORY:  reports that he quit smoking about 29 years ago. He started smoking about 58 years ago. He has never used smokeless tobacco. He reports that he does not drink alcohol or use illicit drugs.  ALLERGIES: Ivp dye  MEDICATIONS:  Current Outpatient Prescriptions  Medication Sig Dispense Refill  . allopurinol (ZYLOPRIM) 100 MG tablet Take 50 mg by mouth daily.      Marland Kitchen ALPRAZolam (  XANAX) 1 MG tablet Take 1 mg by mouth at bedtime as needed. For sleep      . glimepiride (AMARYL) 2 MG tablet Take 2 mg by mouth daily before breakfast.      . metFORMIN (GLUCOPHAGE) 500 MG tablet Take 500 mg by mouth 2 (two) times daily with a meal.      .  omeprazole (PRILOSEC) 20 MG capsule Take 20 mg by mouth 2 (two) times daily.      . simvastatin (ZOCOR) 10 MG tablet Take 10 mg by mouth at bedtime.      . sucralfate (CARAFATE) 1 GM/10ML suspension Take 1 g by mouth 3 (three) times daily after meals.        REVIEW OF SYSTEMS:  A 15 point review of systems is documented in the electronic medical record. This was obtained by the nursing staff. However, I reviewed this with the patient to discuss relevant findings and make appropriate changes.  He can swallow most solids at this time. He denies any pain in the chest area. He denies any shortness of breath new bony pain headaches dizziness or blurred vision.   PHYSICAL EXAM:  height is 6\' 1"  (1.854 m) and weight is 210 lb (95.255 kg). His oral temperature is 98.4 F (36.9 C). His blood pressure is 130/77 and his pulse is 87. His respiration is 16.   BP 130/77  Pulse 87  Temp 98.4 F (36.9 C) (Oral)  Resp 16  Ht 6\' 1"  (1.854 m)  Wt 210 lb (95.255 kg)  BMI 27.71 kg/m2  General Appearance:    Alert, cooperative, no distress, appears stated age  Head:    Normocephalic, without obvious abnormality, atraumatic  Eyes:    PERRL, conjunctiva/corneas clear, EOM's intact,         Ears:    Normal TM's and external ear canals, both ears  Nose:   Nares normal, septum midline, mucosa normal, no drainage    or sinus tenderness  Throat:   Lips, mucosa, and tongue normal;  gums normal, dentures/partials  Neck:   Supple, symmetrical, trachea midline, no adenopathy;       thyroid:  No enlargement/tenderness/nodules; no carotid   bruit or JVD  Back:     Symmetric, no curvature, ROM normal, no CVA tenderness  Lungs:     Clear to auscultation bilaterally, respirations unlabored  Chest wall:    No tenderness or deformity  Heart:    Regular rate and rhythm, , no murmur, rub   or gallop  Abdomen:     Soft, non-tender, bowel sounds active all four quadrants,    no masses, no organomegaly, scars from prior  laproscopic surgery        Extremities:   Extremities normal, atraumatic, no cyanosis or edema  Pulses:   2+ and symmetric all extremities  Skin:   Skin color, texture, turgor normal, no rashes or lesions  Lymph nodes:   Cervical, supraclavicular, and axillary nodes normal  Neurologic:   CNII-XII intact. Normal strength, sensation and reflexes      throughout    LABORATORY DATA: pending   RADIOGRAPHY: Ct Chest W Contrast Ct Abdomen Pelvis W Contrast  12/29/2012  *RADIOLOGY REPORT*  Clinical Data:  Esophageal carcinoma.  CT CHEST, ABDOMEN AND PELVIS WITH CONTRAST  Technique:  Multidetector CT imaging of the chest, abdomen and pelvis was performed following the standard protocol during bolus administration of intravenous contrast.  Contrast: OMNIPAQUE IOHEXOL 300 MG/ML  SOLN  Comparison:  none  CT CHEST  Findings:  No axillary or supraclavicular lymphadenopathy.  No mediastinal or hilar lymphadenopathy.  No pericardial fluid.  There is irregular thickening of the distal esophagus over approximately 6 cm segment below the carina and extending to the GE junction (seen best on sagittal projection image 82).  There is  a small paraesophageal lymph node along the thickened esophagus measuring 5 mm short axis (image 41).  Review of the lung windows demonstrates no suspicious pulmonary nodules.  IMPRESSION:  1.  Long segment of mucosal irregularity and thickening in the distal esophagus. 2.  Small paraesophageal lymph node below the carina.  CT ABDOMEN AND PELVIS  Findings:  There is a of 10 mm lymph node in the gastrohepatic ligament (image 54).  The stomach and duodenum are normal. Post fundoplication.  There are diverticula of the sigmoid colon without acute inflammation. Extensive diverticular disease.  The colon and sigmoid colon without acute inflammation.  No enhancing hepatic lesion.  The  gallbladder, pancreas, spleen, adrenal glands normal.  There is a 5.8 cm low attenuation cyst associated  left kidney which has a single enhancing septation which is hair like (image 66).  In the posterior aspect of the right kidney, there is a 8 mm nonenhancing cyst.  There is no free fluid the pelvis.  Prostate gland bladder normal. No pelvic lymphadenopathy. Review of  bone windows demonstrates no aggressive osseous lesions.  IMPRESSION:  1.  Small gastrohepatic ligament lymph node.  Cannot exclude local nodal metastasis. 2.  No evidence of hepatic metastasis. 3.  Mildly complex right renal cyst.  Favor a Bosniak II probable benign renal cysts which requires no specific follow-up.  4.  Severe colonic diverticulosis without diverticulitis.   Original Report Authenticated By: Genevive Bi, M.D.    Nm Pet Image Initial (pi) Skull Base To Thigh  01/04/2013  *RADIOLOGY REPORT*  Clinical Data: Initial treatment strategy for esophageal carcinoma.  NUCLEAR MEDICINE PET SKULL BASE TO THIGH  Fasting Blood Glucose:  164  Technique:  19.3 mCi F-18 FDG was injected intravenously. CT data was obtained and used for attenuation correction and anatomic localization only.  (This was not acquired as a diagnostic CT examination.) Additional exam technical data entered on technologist worksheet.  Comparison:  Chest CT 12/29/2012  Findings:  Neck: No hypermetabolic nodes in the neck.  Chest:  There is a long segment of hypermetabolic activity within the distal third esophagus beginning below the carina with SUV max = 6.8.  This is associated with circumferential esophageal thickening seen on the CT scanning  and comparison CT scan. There are several small para esophageal lymph nodes which do not have clear metabolic activity but are very small in size including lymph node on image 113 measuring 7 mm.  No mediastinal lymphadenopathy identified.  No suspicious pulmonary nodules.  Abdomen/Pelvis:  There is hypermetabolic gastrohepatic ligament lymph node measuring 9 mm (image 130) with SUV max = 7.2.  No additional hypermetabolic lymph  nodes are present.  There is no abnormal hypermetabolic activity within the liver.  Physiologic activity noted within the gastric antrum and colon.  No evidence of pelvic lymphadenopathy.  Skeleton:  No focal hypermetabolic activity to suggest skeletal metastasis.  IMPRESSION:  1.  Hypermetabolic thickening through the distal esophagus consistent primary esophageal carcinoma. 2.  Evidence of local nodal metastasis with hypermetabolic gastrohepatic ligament lymph node.  T  3.   No evidence of liver metastasis. 4.  No evidence of systemic disease.   Original Report Authenticated By:  Genevive Bi, M.D.       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  The patient would be a good candidate for preoperative radiation therapy. I anticipate a dose of 45 gray to 50.4 gray along with radiosensitizing chemotherapy.  PLAN: Simulation and planning on February 5 with treatments to begin February 11 or 12th concomitant with radiosensitizing chemotherapy  I spent 60 minutes minutes face to face with the patient and more than 50% of that time was spent in counseling and/or coordination of care.   ------------------------------------------------  -----------------------------------  Billie Lade, PhD, MD

## 2013-01-15 NOTE — Progress Notes (Signed)
Complete PATIENT MEASURE OF DISTRESS worksheet with a score of 3 1/2 submitted to social work.

## 2013-01-15 NOTE — Progress Notes (Signed)
See progress note under physician encounter. 

## 2013-01-15 NOTE — Addendum Note (Signed)
Encounter addended by: Agnes Lawrence, RN on: 01/15/2013 12:05 PM<BR>     Documentation filed: Charges VN

## 2013-01-16 ENCOUNTER — Telehealth: Payer: Self-pay | Admitting: Oncology

## 2013-01-16 NOTE — Telephone Encounter (Signed)
C/D 01/16/13 for appt. 01/22/13

## 2013-01-17 ENCOUNTER — Ambulatory Visit
Admission: RE | Admit: 2013-01-17 | Discharge: 2013-01-17 | Disposition: A | Discharge status: Home / Self Care | Payer: Medicare PPO | Source: Ambulatory Visit | Attending: Radiation Oncology | Admitting: Radiation Oncology

## 2013-01-17 ENCOUNTER — Telehealth: Payer: Self-pay | Admitting: Radiation Oncology

## 2013-01-17 DIAGNOSIS — R142 Eructation: Secondary | ICD-10-CM | POA: Insufficient documentation

## 2013-01-17 DIAGNOSIS — C159 Malignant neoplasm of esophagus, unspecified: Secondary | ICD-10-CM | POA: Insufficient documentation

## 2013-01-17 DIAGNOSIS — R141 Gas pain: Secondary | ICD-10-CM | POA: Insufficient documentation

## 2013-01-17 DIAGNOSIS — R0602 Shortness of breath: Secondary | ICD-10-CM | POA: Insufficient documentation

## 2013-01-17 DIAGNOSIS — Z510 Encounter for antineoplastic radiation therapy: Secondary | ICD-10-CM | POA: Insufficient documentation

## 2013-01-17 DIAGNOSIS — R5381 Other malaise: Secondary | ICD-10-CM | POA: Insufficient documentation

## 2013-01-17 DIAGNOSIS — R42 Dizziness and giddiness: Secondary | ICD-10-CM | POA: Insufficient documentation

## 2013-01-17 NOTE — Telephone Encounter (Signed)
Met w patient to discuss RO billing. Pt had no financial concerns today.  Dx: Esophageal cancer - Primary 150.9   Attending Rad:  JK   Rad Tx: IMRT

## 2013-01-18 NOTE — Progress Notes (Signed)
  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    NARRATIVE:  The patient was brought to the CT Simulation planning suite.  Identity was confirmed.  All relevant records and images related to the planned course of therapy were reviewed.  The patient freely provided informed written consent to proceed with treatment after reviewing the details related to the planned course of therapy. The consent form was witnessed and verified by the simulation staff.  Then, the patient was set-up in a stable reproducible  supine position for radiation therapy.  CT images were obtained.  Surface markings were placed.  The CT images were loaded into the planning software.  Then the target and avoidance structures were contoured.  Treatment planning then occurred.  The radiation prescription was entered and confirmed.  Then, I designed and supervised the construction of a total of 0 medically necessary complex treatment devices.  I have requested : Intensity Modulated Radiotherapy (IMRT) is medically necessary for this case for the following reason:  Heart, lung, small  Bowel spinal cord avoidance.  I have ordered:Nutrition Consult  PLAN:  The patient will receive 45 Gy in 25 fractions. In addition the gross tumor volume will receive a simultaneous integrated boost to a dose of 50 gray  ________________________________   Special treatment procedure note  The patient will be receiving radiosensitizing chemotherapy throughout his course of radiation therapy. Given the increased potential for toxicities as well as the necessity for close monitoring of the patient and blood work, this constitutes a special treatment procedure. -----------------------------------  Billie Lade, PhD, MD

## 2013-01-19 NOTE — Addendum Note (Signed)
Encounter addended by: Delynn Flavin, RN on: 01/19/2013  6:03 PM<BR>     Documentation filed: Charges VN

## 2013-01-22 ENCOUNTER — Ambulatory Visit: Payer: Medicare PPO

## 2013-01-22 ENCOUNTER — Telehealth: Payer: Self-pay | Admitting: Oncology

## 2013-01-22 ENCOUNTER — Encounter: Payer: Self-pay | Admitting: Oncology

## 2013-01-22 ENCOUNTER — Ambulatory Visit (HOSPITAL_BASED_OUTPATIENT_CLINIC_OR_DEPARTMENT_OTHER): Payer: Medicare PPO | Admitting: Oncology

## 2013-01-22 VITALS — BP 126/72 | HR 92 | Temp 97.9°F | Resp 20 | Ht 73.0 in | Wt 215.5 lb

## 2013-01-22 DIAGNOSIS — C159 Malignant neoplasm of esophagus, unspecified: Secondary | ICD-10-CM

## 2013-01-22 DIAGNOSIS — K2270 Barrett's esophagus without dysplasia: Secondary | ICD-10-CM

## 2013-01-22 DIAGNOSIS — E119 Type 2 diabetes mellitus without complications: Secondary | ICD-10-CM

## 2013-01-22 NOTE — Progress Notes (Signed)
Met with patient and wife.  Explained role of GI navigator.  Educational information provided on esophageal cancer along with resource sheet and phone numbers.  Referral made to dietician for education.  Patient to attend chemo teaching class tomorrow and begin treatment this week with chemo and RT.  Will continue to follow as needed.

## 2013-01-22 NOTE — Progress Notes (Signed)
[facility] New Patient Consult   Referring MD: Kento Gossman 79 y.o.  August 05, 1934    Reason for Referral: Esophagus cancer     HPI: He reports a history of dysphagia dating to childhood. He developed increased reflux symptoms and dysphagia in the 1990s and was diagnosed with Barrett's esophagus. He underwent a Nissen fundoplication in 1998. He had increased dysphagia in the fall of 2013 and underwent an upper endoscopy on 10/18/2012 and the brushings revealed "atypia ". A repeat upper endoscopy on 12/22/2012 revealed Barrett's mucosa involving the majority esophagus. Market inflammation was noted above the strictured area. The stomach and duodenum appeared normal. Multiple biopsies were taken. The pathology ([id number]) confirmed invasive adenocarcinoma arising in a background of Barrett's mucosa.  An endoscopic ultrasound was performed on 01/10/2013. A 15 cm segment of Barrett's mucosa was noted in the mid to distal esophagus. A firm ulcerated stricture was seen in the distal esophagus. No left liver lesions, no apparent celiac adenopathy, a 10 mm gastrohepatic node was seen. Multiple hypoechoic round well-defined malignant-appearing periesophageal and mediastinal nodes were seen with the largest measuring 8 mm. The tumor was noted to be completely circumferential and invaded the muscularis propria. No obvious tumor penetration through the muscular propria. The tumor was staged by EUS as a T2 N1 lesion.  A PET scan on 01/04/2013 revealed a long segment of hypermetabolic activity in the distal third of the esophagus below the carina. Several small paraesophageal nodes did not have clear metabolic activity. No mediastinal lymphadenopathy was identified. No suspicious pulmonary nodule. A hypermetabolic gastrohepatic ligament node measured 9 mm. No additional hypermetabolic lymph nodes. No hypermetabolic activity in the liver.   He was seen in consultation by Dr.  Tyrone Sage and was referred to consider neoadjuvant chemotherapy/radiation. He saw Dr. Roselind Messier and is scheduled to begin radiation 01/24/2013.  Mr. Terry saw Dr. Ewing Schlein for a second surgical opinion on 01/18/2013.  Past Medical History  Diagnosis Date  . Gout     no flare up in ten years   . Barrett esophagus   . Esophageal cancer 12/22/2012    invasive adenocarcinoma arising in a background of barrett's mucosa  . Diabetes mellitus without complication     type II; last A1C 6.5 10/2012  . GERD (gastroesophageal reflux disease)     managed by prilosec    Past Surgical History  Procedure Laterality Date  . Nissen fundoplication   1998   . Tonsillectomy    . Laryngoscopy    . Esophagogastroduodenoscopy  10/18/2012    Procedure: ESOPHAGOGASTRODUODENOSCOPY (EGD);  Surgeon: Vertell Novak., MD;  Location: [facility];  Service: Endoscopy;  Laterality: N/A;  need xray  . Savory dilation  10/18/2012    Procedure: SAVORY DILATION;  Surgeon: Vertell Novak., MD;  Location: [facility];  Service: Endoscopy;  Laterality: N/A;  . Esophagogastroduodenoscopy  12/22/2012    Procedure: ESOPHAGOGASTRODUODENOSCOPY (EGD);  Surgeon: Vertell Novak., MD;  Location: [facility];  Service: Endoscopy;  Laterality: N/A;  with C-Arm  . Savory dilation  12/22/2012    Procedure: SAVORY DILATION;  Surgeon: Vertell Novak., MD;  Location: [facility];  Service: Endoscopy;  Laterality: N/A;  . Eus  01/10/2013    Procedure: ESOPHAGEAL ENDOSCOPIC ULTRASOUND (EUS) RADIAL;  Surgeon: Willis Modena, MD;  Location: [facility];  Service: Endoscopy;  Laterality: N/A;  . Removal of breast tissue     .    A larynx polyp removal  in 1958  Family History  Problem Relation Age of Onset  . Cancer Father     pancreatic cancer; questionable miss dx  . Dementia Father   . Cancer Mother     cervical  2 sisters and one brother. 4 children. No other family history of cancer.  Current outpatient  prescriptions:allopurinol (ZYLOPRIM) 100 MG tablet, Take 50 mg by mouth daily., Disp: , Rfl: ;  ALPRAZolam (XANAX) 1 MG tablet, Take 1 mg by mouth at bedtime as needed. For sleep, Disp: , Rfl: ;  glimepiride (AMARYL) 2 MG tablet, Take 2 mg by mouth daily before breakfast., Disp: , Rfl: ;  metFORMIN (GLUCOPHAGE) 500 MG tablet, Take 500 mg by mouth 2 (two) times daily with a meal., Disp: , Rfl:  omeprazole (PRILOSEC) 20 MG capsule, Take 20 mg by mouth 2 (two) times daily., Disp: , Rfl: ;  simvastatin (ZOCOR) 10 MG tablet, Take 10 mg by mouth at bedtime., Disp: , Rfl: ;  sucralfate (CARAFATE) 1 GM/10ML suspension, Take 10 mLs (1 g total) by mouth 4 (four) times daily., Disp: 420 mL, Rfl: 2  Allergies:  Allergies  Allergen Reactions  . Ivp Dye (Iodinated Diagnostic Agents) Swelling    13 hr prep/reaction was 20 years ago per patient    Social History: He lives with his wife in [place]. He is retired from a Research scientist (physical sciences) occupation. He quit smoking cigarettes in 1984. He quit alcohol in 1985. He was in the [employer] for 3 years. No risk factor for HIV or hepatitis.   ROS:   Positives include: Solid dysphagia-chronic, dry skin A complete ROS was otherwise negative.  Physical Exam:  Blood pressure 126/72, pulse 92, temperature 97.9 F (36.6 C), temperature source Oral, resp. rate 20, height 6\' 1"  (1.854 m), weight 215 lb 8 oz (97.75 kg).  HEENT: Upper and lower denture plate, oropharynx without visible mass, neck without mass Lungs: Clear bilaterally Cardiac: Regular rate and rhythm Abdomen: No hepatosplenomegaly, nontender, no mass GU: Testes without mass  Vascular: No leg edema Lymph nodes: No cervical, supraclavicular, axillary, or inguinal nodes Neurologic: Alert and oriented, the motor exam appears intact in the upper and lower extremities Skin: Multiple benign appearing moles over the trunk   LAB:  CBC, chemistry panel, and CEA to be obtained on  01/23/2013  Radiology: As per history of present illness    Assessment/Plan:   1. Adenocarcinoma of the distal esophagus, T2 N1 by EUS/PET staging  2. History of Barrett's esophagus  3. Adult onset diabetes   Disposition:   He has been diagnosed with adenocarcinoma the esophagus. He appears to have locally advanced disease based on the staging evaluation to date. I discussed treatment options with Mr. Malerba and his wife. He understands the majority of patients with localized esophagus cancer are treated with neoadjuvant chemotherapy/radiation followed by surgery. He is scheduled to begin radiation 01/24/2013. I recommend concurrent weekly Taxol/carboplatin chemotherapy.  We reviewed the potential toxicities associated with this chemotherapy regimen including the chance for nausea/vomiting, mucositis, diarrhea alopecia, and hematologic toxicity. We discussed the neuropathy and bone pain associated with Taxol. We discussed the potential for an allergic reaction with both Taxol and carboplatin. Mr. Bognar will attend a chemotherapy teaching class on 01/23/2013. He is scheduled for a first cycle of chemotherapy on 01/25/2013.    Jocelyn Nold 01/22/2013, 4:31 PM

## 2013-01-22 NOTE — Telephone Encounter (Signed)
Appt with [facility] cancelled per pt, they are going to [facility], pt aware

## 2013-01-22 NOTE — Telephone Encounter (Signed)
Gave pt appt for lab, chemo class, chemo and nutrition consult for February and MArch [facility]

## 2013-01-22 NOTE — Progress Notes (Signed)
Checked in new pt with no financial concerns. °

## 2013-01-23 ENCOUNTER — Other Ambulatory Visit: Payer: Self-pay | Admitting: *Deleted

## 2013-01-23 ENCOUNTER — Telehealth: Payer: Self-pay | Admitting: Oncology

## 2013-01-23 ENCOUNTER — Other Ambulatory Visit: Payer: Medicare PPO

## 2013-01-23 ENCOUNTER — Encounter: Payer: Self-pay | Admitting: *Deleted

## 2013-01-23 ENCOUNTER — Other Ambulatory Visit (HOSPITAL_BASED_OUTPATIENT_CLINIC_OR_DEPARTMENT_OTHER): Payer: Medicare PPO | Admitting: Lab

## 2013-01-23 DIAGNOSIS — C159 Malignant neoplasm of esophagus, unspecified: Secondary | ICD-10-CM

## 2013-01-23 LAB — CBC WITH DIFFERENTIAL/PLATELET
Basophils Absolute: 0.1 10*3/uL (ref 0.0–0.1)
Eosinophils Absolute: 0.2 10*3/uL (ref 0.0–0.5)
HGB: 15 g/dL (ref 13.0–17.1)
LYMPH%: 31.9 % (ref 14.0–49.0)
MONO#: 1 10*3/uL — ABNORMAL HIGH (ref 0.1–0.9)
NEUT#: 3.9 10*3/uL (ref 1.5–6.5)
Platelets: 244 10*3/uL (ref 140–400)
RBC: 4.81 10*6/uL (ref 4.20–5.82)
WBC: 7.5 10*3/uL (ref 4.0–10.3)

## 2013-01-23 LAB — COMPREHENSIVE METABOLIC PANEL (CC13)
ALT: 21 U/L (ref 0–55)
CO2: 32 mEq/L — ABNORMAL HIGH (ref 22–29)
Calcium: 10.2 mg/dL (ref 8.4–10.4)
Chloride: 103 mEq/L (ref 98–107)
Creatinine: 1.1 mg/dL (ref 0.7–1.3)
Sodium: 143 mEq/L (ref 136–145)
Total Protein: 7.3 g/dL (ref 6.4–8.3)

## 2013-01-23 MED ORDER — DEXAMETHASONE 4 MG PO TABS: 10.0000 mg | ORAL_TABLET | ORAL | Status: DC

## 2013-01-23 MED ORDER — PROCHLORPERAZINE MALEATE 10 MG PO TABS: 10.0000 mg | ORAL_TABLET | Freq: Four times a day (QID) | ORAL | Status: DC | PRN

## 2013-01-23 NOTE — Telephone Encounter (Signed)
Talked to patient and gave him appt for 2/13th and he will check his appts online

## 2013-01-24 ENCOUNTER — Ambulatory Visit: Payer: Medicare PPO | Admitting: Nutrition

## 2013-01-24 ENCOUNTER — Telehealth: Payer: Self-pay | Admitting: *Deleted

## 2013-01-24 ENCOUNTER — Ambulatory Visit
Admission: RE | Admit: 2013-01-24 | Discharge: 2013-01-24 | Disposition: A | Discharge status: Home / Self Care | Payer: Medicare PPO | Source: Ambulatory Visit | Attending: Radiation Oncology | Admitting: Radiation Oncology

## 2013-01-24 DIAGNOSIS — C159 Malignant neoplasm of esophagus, unspecified: Secondary | ICD-10-CM

## 2013-01-24 LAB — CEA: CEA: <0.5 ng/mL (ref 0.0–5.0)

## 2013-01-24 NOTE — Telephone Encounter (Signed)
Per staff message and POF I have scheduled appts.  Matthew White  

## 2013-01-24 NOTE — Progress Notes (Signed)
Patient is a 79 year old male diagnosed with esophageal cancer receiving neoadjuvant chemoradiation therapy. He is a patient of Dr. Truett Perna.  Past medical history includes gout, Barrett's esophagus, diabetes, GERD, and dysphasia.  Medications include Xanax, Amaryl, Glucophage, Prilosec, Zocor, and Carafate.  Labs include glucose of 208 on January 29.  Height: 6 feet 1 inch. Weight: 215.5 pounds. Usual body weight: 210 pounds November 2013. BMI: 28.44.  Patient and wife present to nutrition counseling session. Patient is currently eating well and consumes a generally healthy diet. He currently does not have any difficulty eating. He is interested in information on strategies for eating during chemotherapy and radiation treatment. Patient states plan is for chemoradiation and surgery to follow.  Nutrition diagnosis: Food and nutrition related knowledge deficit related to new diagnosis of esophageal cancer and associated treatments as evidenced by no prior need for nutrition related information.  Intervention: I educated patient and wife on the importance of adequate calories and protein to promote weight maintenance throughout treatment. I have encouraged patient to consume smaller more frequent meals with protein foods at every meal and snack. I have reviewed these foods with him. I briefly touched on strategies for eating with potential side effects. We've discussed ways to add more protein in the form of Carnation breakfast essentials or Unjury protein powder. Teach back method used.  Monitoring, evaluation, goals: Patient will tolerate adequate calories and protein to minimize weight loss throughout treatment and maintain nutritional stores.  Next visit: Thursday, February 20, during chemotherapy.

## 2013-01-25 ENCOUNTER — Ambulatory Visit
Admission: RE | Admit: 2013-01-25 | Discharge: 2013-01-25 | Disposition: A | Discharge status: Home / Self Care | Payer: Medicare PPO | Source: Ambulatory Visit | Attending: Radiation Oncology | Admitting: Radiation Oncology

## 2013-01-25 ENCOUNTER — Ambulatory Visit (HOSPITAL_BASED_OUTPATIENT_CLINIC_OR_DEPARTMENT_OTHER): Payer: Medicare PPO

## 2013-01-25 ENCOUNTER — Other Ambulatory Visit: Payer: Self-pay | Admitting: Oncology

## 2013-01-25 VITALS — BP 94/56 | HR 97 | Temp 98.2°F | Resp 20

## 2013-01-25 DIAGNOSIS — C159 Malignant neoplasm of esophagus, unspecified: Secondary | ICD-10-CM

## 2013-01-25 DIAGNOSIS — Z5111 Encounter for antineoplastic chemotherapy: Secondary | ICD-10-CM

## 2013-01-25 MED ORDER — SODIUM CHLORIDE 0.9 % IV SOLN
208.0000 mg | Freq: Once | INTRAVENOUS | Status: AC
  Administered 2013-01-25: 210 mg via INTRAVENOUS
  Filled 2013-01-25: qty 21

## 2013-01-25 MED ORDER — DEXAMETHASONE SODIUM PHOSPHATE 4 MG/ML IJ SOLN
20.0000 mg | Freq: Once | INTRAMUSCULAR | Status: AC
  Administered 2013-01-25: 20 mg via INTRAVENOUS

## 2013-01-25 MED ORDER — DIPHENHYDRAMINE HCL 50 MG/ML IJ SOLN
25.0000 mg | Freq: Once | INTRAMUSCULAR | Status: AC
  Administered 2013-01-25: 11:00:00 via INTRAVENOUS

## 2013-01-25 MED ORDER — FAMOTIDINE IN NACL 20-0.9 MG/50ML-% IV SOLN
20.0000 mg | Freq: Once | INTRAVENOUS | Status: AC
  Administered 2013-01-25: 20 mg via INTRAVENOUS

## 2013-01-25 MED ORDER — ONDANSETRON 16 MG/50ML IVPB (CHCC)
16.0000 mg | Freq: Once | INTRAVENOUS | Status: AC
  Administered 2013-01-25: 16 mg via INTRAVENOUS

## 2013-01-25 MED ORDER — PACLITAXEL CHEMO INJECTION 300 MG/50ML
50.0000 mg/m2 | Freq: Once | INTRAVENOUS | Status: AC
  Administered 2013-01-25: 114 mg via INTRAVENOUS
  Filled 2013-01-25: qty 19

## 2013-01-25 MED ORDER — SODIUM CHLORIDE 0.9 % IV SOLN
Freq: Once | INTRAVENOUS | Status: AC
  Administered 2013-01-25: 11:00:00 via INTRAVENOUS

## 2013-01-25 NOTE — Progress Notes (Signed)
   Department of Radiation Oncology  Phone:  [phone] Fax:        [phone]  Intensity modulation radiation therapy treatment device  On 01/24/2013 the patient began his IMRT directed at the esophageal region. The patient will be treated with helical IMRT patient had construction of his treatment device.  He will be treated with 11 sinogram segments this constitutes 1 IMRT device.  -----------------------------------  Billie Lade, PhD, MD

## 2013-01-25 NOTE — Patient Instructions (Signed)
[facility] Discharge Instructions for Patients Receiving Chemotherapy  Today you received the following chemotherapy agents Taxol/Carboplatin.  To help prevent nausea and vomiting after your treatment, we encourage you to take your nausea medication as directed.   If you develop nausea and vomiting that is not controlled by your nausea medication, call the clinic. If it is after clinic hours your family physician or the after hours number for the clinic or go to the Emergency Department.   BELOW ARE SYMPTOMS THAT SHOULD BE REPORTED IMMEDIATELY:  *FEVER GREATER THAN 100.5 F  *CHILLS WITH OR WITHOUT FEVER  NAUSEA AND VOMITING THAT IS NOT CONTROLLED WITH YOUR NAUSEA MEDICATION  *UNUSUAL SHORTNESS OF BREATH  *UNUSUAL BRUISING OR BLEEDING  TENDERNESS IN MOUTH AND THROAT WITH OR WITHOUT PRESENCE OF ULCERS  *URINARY PROBLEMS  *BOWEL PROBLEMS  UNUSUAL RASH Items with * indicate a potential emergency and should be followed up as soon as possible.  One of the nurses will contact you 24 hours after your treatment. Please let the nurse know about any problems that you may have experienced. Feel free to call the clinic you have any questions or concerns. The clinic phone number is [phone].

## 2013-01-26 ENCOUNTER — Telehealth: Payer: Self-pay | Admitting: *Deleted

## 2013-01-26 ENCOUNTER — Ambulatory Visit
Admission: RE | Admit: 2013-01-26 | Discharge: 2013-01-26 | Disposition: A | Discharge status: Home / Self Care | Payer: Medicare PPO | Source: Ambulatory Visit | Attending: Radiation Oncology | Admitting: Radiation Oncology

## 2013-01-26 NOTE — Telephone Encounter (Signed)
Called patient for chemo follow up, states he came home, took a nap, then got back on his treadmill and walked 2 more miles last night. Slept well, denies any nausea/vomiting/diarrhea. Patient understands to call us for any problems.

## 2013-01-26 NOTE — Telephone Encounter (Signed)
Message copied by Kathlynn Grate on Fri Jan 26, 2013  2:14 PM ------      Message from: [place], [place] E      Created: Thu Jan 25, 2013  1:33 PM       1st Taxol/Carbo with daily RT. ------

## 2013-01-27 ENCOUNTER — Other Ambulatory Visit: Payer: Self-pay

## 2013-01-29 ENCOUNTER — Ambulatory Visit
Admission: RE | Admit: 2013-01-29 | Discharge: 2013-01-29 | Disposition: A | Discharge status: Home / Self Care | Payer: Medicare PPO | Source: Ambulatory Visit | Attending: Radiation Oncology | Admitting: Radiation Oncology

## 2013-01-30 ENCOUNTER — Ambulatory Visit
Admission: RE | Admit: 2013-01-30 | Discharge: 2013-01-30 | Disposition: A | Discharge status: Home / Self Care | Payer: Medicare PPO | Source: Ambulatory Visit | Attending: Radiation Oncology | Admitting: Radiation Oncology

## 2013-01-30 ENCOUNTER — Encounter: Payer: Self-pay | Admitting: Radiation Oncology

## 2013-01-30 VITALS — BP 113/68 | HR 103 | Resp 18 | Wt 213.7 lb

## 2013-01-30 DIAGNOSIS — C159 Malignant neoplasm of esophagus, unspecified: Secondary | ICD-10-CM

## 2013-01-30 NOTE — Progress Notes (Signed)
Patient presents to the clinic today unaccompanied for PUT with Dr. Roselind Messier. Patient wants to wait on post sim until Friday when his wife can come with him. Patient is alert and oriented to person, place, and time. No distress noted. Steady gait noted. Pleasant affect noted. Patient denies pain at this time. Denies skin changes, hoarseness, or cough. Patient denies painful or difficulty swallowing. Reported all findings to Dr. Roselind Messier.

## 2013-01-30 NOTE — Progress Notes (Signed)
[facility]    Radiation Oncology [address] [place]     Maryln Gottron, M.D. [place], [place] [address]               Billie Lade, M.D., Ph.D. Phone: [phone]      Molli Hazard A. Kathrynn Running, M.D. Fax: [phone]      Radene Gunning, M.D., Ph.D.         Lurline Hare, M.D.         Grayland Jack, M.D Weekly Treatment Management Note  Name: Matthew White     MRN: [id number]        CSN: [id number] Date: 01/30/2013      DOB: 08-07-1934  CC: Gwen Pounds, MD         Timothy Lasso    Status: Outpatient  Diagnosis: The encounter diagnosis was Esophageal cancer.  Current Dose: 9 Gy  Current Fraction: 5  Planned Dose: 45 Gy  Narrative: [name] was seen today for weekly treatment management. The chart was checked and MVCT  were reviewed. He is tolerating his radiation treatment well this time without any side effects. He did have some malaise after his chemotherapy late last week. he denies any breathing problems.  Ivp dye  Current Outpatient Prescriptions  Medication Sig Dispense Refill  . allopurinol (ZYLOPRIM) 100 MG tablet Take 50 mg by mouth daily.      Marland Kitchen ALPRAZolam (XANAX) 1 MG tablet Take 1 mg by mouth at bedtime as needed. For sleep      . glimepiride (AMARYL) 2 MG tablet Take 2 mg by mouth daily before breakfast.      . metFORMIN (GLUCOPHAGE) 500 MG tablet Take 500 mg by mouth 2 (two) times daily with a meal.      . omeprazole (PRILOSEC) 20 MG capsule Take 20 mg by mouth 2 (two) times daily.      . simvastatin (ZOCOR) 10 MG tablet Take 10 mg by mouth at bedtime.      . sucralfate (CARAFATE) 1 GM/10ML suspension Take 10 mLs (1 g total) by mouth 4 (four) times daily.  420 mL  2  . dexamethasone (DECADRON) 4 MG tablet Take 2.5 tablets (10 mg total) by mouth as directed. Take at 10 pm night before 1st chemo and at 6 am day of 1st chemo  5 tablet  0  . prochlorperazine (COMPAZINE) 10 MG tablet Take 1 tablet (10 mg total) by mouth every 6 (six) hours as needed  (nausea).  60 tablet  1   No current facility-administered medications for this encounter.   Labs:  Lab Results  Component Value Date   WBC 7.5 01/23/2013   HGB 15.0 01/23/2013   HCT 43.4 01/23/2013   MCV 90.2 01/23/2013   PLT 244 01/23/2013   Lab Results  Component Value Date   CREATININE 1.1 01/23/2013   BUN 20.1 01/23/2013   NA 143 01/23/2013   K 4.8 01/23/2013   CL 103 01/23/2013   CO2 32* 01/23/2013   Lab Results  Component Value Date   ALT 21 01/23/2013   AST 16 01/23/2013   BILITOT 0.63 01/23/2013    Physical Examination:  weight is 213 lb 11.2 oz (96.934 kg). His blood pressure is 113/68 and his pulse is 103. His respiration is 18 and oxygen saturation is 99%.    Wt Readings from Last 3 Encounters:  01/30/13 213 lb 11.2 oz (96.934 kg)  01/22/13 215 lb 8 oz (97.75 kg)  01/15/13 210  lb (95.255 kg)    No palpable supraclavicular adenopathy. Lungs - Normal respiratory effort, chest expands symmetrically. Lungs are clear to auscultation, no crackles or wheezes.  Heart has regular rhythm and rate  Abdomen is soft and non tender with normal bowel sounds  Assessment:  Patient tolerating treatments well  Plan: Continue treatment per original radiation prescription

## 2013-01-31 ENCOUNTER — Ambulatory Visit
Admission: RE | Admit: 2013-01-31 | Discharge: 2013-01-31 | Disposition: A | Discharge status: Home / Self Care | Payer: Medicare PPO | Source: Ambulatory Visit | Attending: Radiation Oncology | Admitting: Radiation Oncology

## 2013-01-31 ENCOUNTER — Other Ambulatory Visit: Payer: Self-pay | Admitting: *Deleted

## 2013-02-01 ENCOUNTER — Other Ambulatory Visit: Payer: Medicare PPO | Admitting: Lab

## 2013-02-01 ENCOUNTER — Telehealth: Payer: Self-pay | Admitting: *Deleted

## 2013-02-01 ENCOUNTER — Ambulatory Visit (HOSPITAL_BASED_OUTPATIENT_CLINIC_OR_DEPARTMENT_OTHER): Payer: Medicare PPO

## 2013-02-01 ENCOUNTER — Other Ambulatory Visit: Payer: Self-pay | Admitting: Oncology

## 2013-02-01 ENCOUNTER — Ambulatory Visit
Admission: RE | Admit: 2013-02-01 | Discharge: 2013-02-01 | Disposition: A | Discharge status: Home / Self Care | Payer: Medicare PPO | Source: Ambulatory Visit | Attending: Radiation Oncology | Admitting: Radiation Oncology

## 2013-02-01 ENCOUNTER — Telehealth: Payer: Self-pay | Admitting: Oncology

## 2013-02-01 ENCOUNTER — Ambulatory Visit: Payer: Medicare PPO | Admitting: Nutrition

## 2013-02-01 VITALS — BP 131/84 | HR 93 | Temp 97.8°F | Resp 20

## 2013-02-01 DIAGNOSIS — Z5111 Encounter for antineoplastic chemotherapy: Secondary | ICD-10-CM

## 2013-02-01 DIAGNOSIS — C159 Malignant neoplasm of esophagus, unspecified: Secondary | ICD-10-CM

## 2013-02-01 LAB — CBC WITH DIFFERENTIAL/PLATELET
BASO%: 0.5 % (ref 0.0–2.0)
MCHC: 34.8 g/dL (ref 32.0–36.0)
MONO#: 0.4 10*3/uL (ref 0.1–0.9)
RBC: 4.51 10*6/uL (ref 4.20–5.82)
WBC: 4.2 10*3/uL (ref 4.0–10.3)
lymph#: 0.6 10*3/uL — ABNORMAL LOW (ref 0.9–3.3)
nRBC: 0 % (ref 0–0)

## 2013-02-01 MED ORDER — SODIUM CHLORIDE 0.9 % IV SOLN
Freq: Once | INTRAVENOUS | Status: AC
  Administered 2013-02-01: 10:00:00 via INTRAVENOUS

## 2013-02-01 MED ORDER — SODIUM CHLORIDE 0.9 % IV SOLN
208.0000 mg | Freq: Once | INTRAVENOUS | Status: AC
  Administered 2013-02-01: 210 mg via INTRAVENOUS
  Filled 2013-02-01: qty 21

## 2013-02-01 MED ORDER — DEXAMETHASONE SODIUM PHOSPHATE 10 MG/ML IJ SOLN
10.0000 mg | Freq: Once | INTRAMUSCULAR | Status: AC
  Administered 2013-02-01: 10 mg via INTRAVENOUS

## 2013-02-01 MED ORDER — ONDANSETRON 16 MG/50ML IVPB (CHCC)
16.0000 mg | Freq: Once | INTRAVENOUS | Status: AC
  Administered 2013-02-01: 16 mg via INTRAVENOUS

## 2013-02-01 MED ORDER — PACLITAXEL CHEMO INJECTION 300 MG/50ML
50.0000 mg/m2 | Freq: Once | INTRAVENOUS | Status: AC
  Administered 2013-02-01: 114 mg via INTRAVENOUS
  Filled 2013-02-01: qty 19

## 2013-02-01 MED ORDER — DIPHENHYDRAMINE HCL 50 MG/ML IJ SOLN
25.0000 mg | Freq: Once | INTRAMUSCULAR | Status: AC
  Administered 2013-02-01: 25 mg via INTRAVENOUS

## 2013-02-01 MED ORDER — FAMOTIDINE IN NACL 20-0.9 MG/50ML-% IV SOLN
20.0000 mg | Freq: Once | INTRAVENOUS | Status: AC
  Administered 2013-02-01: 20 mg via INTRAVENOUS

## 2013-02-01 NOTE — Telephone Encounter (Signed)
Talked to pt and he has calendar appt for February 2014 and MArch 2014

## 2013-02-01 NOTE — Telephone Encounter (Signed)
Met with patient during infusion to assess for needs.  He stated he was tolerating his treatments well and denied complaints.  He expressed no requests at this time.  Will continue to follow as needed.

## 2013-02-01 NOTE — Patient Instructions (Addendum)
[facility] Discharge Instructions for Patients Receiving Chemotherapy  Today you received the following chemotherapy agents taxol and carboplatin.  To help prevent nausea and vomiting after your treatment, we encourage you to take your nausea medication as directed.    If you develop nausea and vomiting that is not controlled by your nausea medication, call the clinic. If it is after clinic hours your family physician or the after hours number for the clinic or go to the Emergency Department.   BELOW ARE SYMPTOMS THAT SHOULD BE REPORTED IMMEDIATELY:  *FEVER GREATER THAN 100.5 F  *CHILLS WITH OR WITHOUT FEVER  NAUSEA AND VOMITING THAT IS NOT CONTROLLED WITH YOUR NAUSEA MEDICATION  *UNUSUAL SHORTNESS OF BREATH  *UNUSUAL BRUISING OR BLEEDING  TENDERNESS IN MOUTH AND THROAT WITH OR WITHOUT PRESENCE OF ULCERS  *URINARY PROBLEMS  *BOWEL PROBLEMS  UNUSUAL RASH Items with * indicate a potential emergency and should be followed up as soon as possible.   Please let the nurse know about any problems that you may have experienced. Feel free to call the clinic you have any questions or concerns. The clinic phone number is [phone].   I have been informed and understand all the instructions given to me. I know to contact the clinic, my physician, or go to the Emergency Department if any problems should occur. I do not have any questions at this time, but understand that I may call the clinic during office hours   should I have any questions or need assistance in obtaining follow up care.    __________________________________________  _____________  __________ Signature of Patient or Authorized Representative            Date                   Time    __________________________________________ Nurse's Signature

## 2013-02-01 NOTE — Progress Notes (Signed)
Patient reports he is doing well. He is eating a healthy diet. His weight has declined slightly to 213.7 pounds on February 18 however he is still over his usual body weight of 210 pounds. He has no nutrition side effects at this time.  Nutrition diagnosis: Food and nutrition related knowledge deficit has improved.  Intervention: I educated patient on continuing healthy diet with adequate protein to promote weight maintenance. I briefly educated on strategies for eating if nausea develops. Handout provided for patient. Teach back method used.  Monitoring, evaluation, goals: Patient will continue to tolerate a healthy diet for weight maintenance throughout treatment.  Next visit: Thursday, February 27, during chemotherapy.

## 2013-02-02 ENCOUNTER — Ambulatory Visit
Admission: RE | Admit: 2013-02-02 | Discharge: 2013-02-02 | Disposition: A | Discharge status: Home / Self Care | Payer: Medicare PPO | Source: Ambulatory Visit | Attending: Radiation Oncology | Admitting: Radiation Oncology

## 2013-02-02 ENCOUNTER — Other Ambulatory Visit: Payer: Self-pay | Admitting: Certified Registered Nurse Anesthetist

## 2013-02-05 ENCOUNTER — Ambulatory Visit
Admission: RE | Admit: 2013-02-05 | Discharge: 2013-02-05 | Disposition: A | Discharge status: Home / Self Care | Payer: Medicare PPO | Source: Ambulatory Visit | Attending: Radiation Oncology | Admitting: Radiation Oncology

## 2013-02-05 ENCOUNTER — Ambulatory Visit: Payer: Medicare PPO

## 2013-02-06 ENCOUNTER — Ambulatory Visit
Admission: RE | Admit: 2013-02-06 | Discharge: 2013-02-06 | Disposition: A | Discharge status: Home / Self Care | Payer: Medicare PPO | Source: Ambulatory Visit | Attending: Radiation Oncology | Admitting: Radiation Oncology

## 2013-02-06 ENCOUNTER — Encounter: Payer: Self-pay | Admitting: Radiation Oncology

## 2013-02-06 VITALS — BP 96/79 | HR 93 | Temp 98.0°F | Resp 16 | Wt 207.6 lb

## 2013-02-06 NOTE — Progress Notes (Signed)
[facility]    Radiation Oncology [address] [place]     Maryln Gottron, M.D. [place], [place] [address]               Billie Lade, M.D., Ph.D. Phone: [phone]      Molli Hazard A. Kathrynn Running, M.D. Fax: [phone]      Radene Gunning, M.D., Ph.D.         Lurline Hare, M.D.         Grayland Jack, M.D Weekly Treatment Management Note  Name: Matthew White     MRN: [id number]        CSN: [id number] Date: 02/06/2013      DOB: 1934-01-18  CC: Gwen Pounds, MD         Timothy Lasso    Status: Outpatient  Diagnosis: The encounter diagnosis was Esophageal cancer.  Current Dose: 18 Gy  Current Fraction: 10  Planned Dose: 45 Gy  Narrative: [name] was seen today for weekly treatment management. The chart was checked and MVCT  were reviewed. He continues to tolerate his treatments well without any side effects.  Ivp dye Current Outpatient Prescriptions  Medication Sig Dispense Refill  . allopurinol (ZYLOPRIM) 100 MG tablet Take 50 mg by mouth daily.      Marland Kitchen ALPRAZolam (XANAX) 1 MG tablet Take 1 mg by mouth at bedtime as needed. For sleep      . dexamethasone (DECADRON) 4 MG tablet       . glimepiride (AMARYL) 2 MG tablet Take 2 mg by mouth daily before breakfast.      . insulin glargine (LANTUS) 100 UNIT/ML injection Inject 10 Units into the skin at bedtime.      . metFORMIN (GLUCOPHAGE) 500 MG tablet Take 500 mg by mouth 2 (two) times daily with a meal.      . omeprazole (PRILOSEC) 20 MG capsule Take 20 mg by mouth 2 (two) times daily.      . prochlorperazine (COMPAZINE) 10 MG tablet Take 1 tablet (10 mg total) by mouth every 6 (six) hours as needed (nausea).  60 tablet  1  . simvastatin (ZOCOR) 10 MG tablet Take 10 mg by mouth at bedtime.      . sucralfate (CARAFATE) 1 GM/10ML suspension Take 10 mLs (1 g total) by mouth 4 (four) times daily.  420 mL  2  . Wound Cleansers (RADIAPLEX EX) Apply topically.       No current facility-administered medications for this  encounter.   Labs:  Lab Results  Component Value Date   WBC 4.2 02/01/2013   HGB 13.8 02/01/2013   HCT 39.7 02/01/2013   MCV 88.0 02/01/2013   PLT 231 02/01/2013   Lab Results  Component Value Date   CREATININE 1.1 01/23/2013   BUN 20.1 01/23/2013   NA 143 01/23/2013   K 4.8 01/23/2013   CL 103 01/23/2013   CO2 32* 01/23/2013   Lab Results  Component Value Date   ALT 21 01/23/2013   AST 16 01/23/2013   BILITOT 0.63 01/23/2013    Physical Examination:  weight is 207 lb 9.6 oz (94.167 kg). His oral temperature is 98 F (36.7 C). His blood pressure is 96/79 and his pulse is 93. His respiration is 16.    Wt Readings from Last 3 Encounters:  02/06/13 207 lb 9.6 oz (94.167 kg)  01/30/13 213 lb 11.2 oz (96.934 kg)  01/22/13 215 lb 8 oz (97.75 kg)    No palpable  supraclavicular adenopathy. Lungs - Normal respiratory effort, chest expands symmetrically. Lungs are clear to auscultation, no crackles or wheezes.  Heart has regular rhythm and rate  Abdomen is soft and non tender with normal bowel sounds  Assessment:  Patient tolerating treatments well  Plan: Continue treatment per original radiation prescription

## 2013-02-06 NOTE — Progress Notes (Signed)
Patient presents to the clinic today unaccompanied for PUT with Dr. Roselind Messier. Patient is alert and oriented to person, place, and time. No distress noted. Steady gait noted. Pleasant affect noted. Patient denies pain at this time. Denies skin changes, hoarseness, or cough. Patient denies painful or difficulty swallowing. Reported all findings to Dr. Roselind Messier.

## 2013-02-07 ENCOUNTER — Ambulatory Visit
Admission: RE | Admit: 2013-02-07 | Discharge: 2013-02-07 | Disposition: A | Discharge status: Home / Self Care | Payer: Medicare PPO | Source: Ambulatory Visit | Attending: Radiation Oncology | Admitting: Radiation Oncology

## 2013-02-07 ENCOUNTER — Other Ambulatory Visit: Payer: Self-pay | Admitting: Oncology

## 2013-02-07 MED ORDER — RADIAPLEXRX EX GEL
Freq: Once | CUTANEOUS | Status: AC
  Administered 2013-02-07: 17:00:00 via TOPICAL

## 2013-02-07 NOTE — Progress Notes (Signed)
Late entry from 02/05/13 at 0956. Oriented patient to staff and routine of the clinic. Provided patient with RADIATION THERAPY AND YOU handbook then, reviewed pertinent information. Educated patient on potential side effects and management such as, fatigue and difficulty swallowing. Provided patient with radiaplex gel then, directed upon use. All questions answered. Provided patient with this writer's contact information and encouraged to call with needs. Patient verbalized understanding of all reviewed.

## 2013-02-07 NOTE — Addendum Note (Signed)
Encounter addended by: Agnes Lawrence, RN on: 02/07/2013  5:12 PM<BR>     Documentation filed: Chief Complaint Section, Notes Section, Orders, Inpatient Patient Education, Inpatient Document Flowsheet, Inpatient [facility]

## 2013-02-08 ENCOUNTER — Ambulatory Visit (HOSPITAL_BASED_OUTPATIENT_CLINIC_OR_DEPARTMENT_OTHER): Payer: Medicare PPO | Admitting: Oncology

## 2013-02-08 ENCOUNTER — Other Ambulatory Visit (HOSPITAL_BASED_OUTPATIENT_CLINIC_OR_DEPARTMENT_OTHER): Payer: Medicare PPO | Admitting: Lab

## 2013-02-08 ENCOUNTER — Ambulatory Visit
Admission: RE | Admit: 2013-02-08 | Discharge: 2013-02-08 | Disposition: A | Discharge status: Home / Self Care | Payer: Medicare PPO | Source: Ambulatory Visit | Attending: Radiation Oncology | Admitting: Radiation Oncology

## 2013-02-08 ENCOUNTER — Ambulatory Visit (HOSPITAL_BASED_OUTPATIENT_CLINIC_OR_DEPARTMENT_OTHER): Payer: Medicare PPO

## 2013-02-08 ENCOUNTER — Telehealth: Payer: Self-pay | Admitting: Oncology

## 2013-02-08 ENCOUNTER — Ambulatory Visit: Payer: Medicare PPO | Admitting: Cardiothoracic Surgery

## 2013-02-08 ENCOUNTER — Ambulatory Visit: Payer: Medicare PPO | Admitting: Nutrition

## 2013-02-08 VITALS — BP 112/80 | HR 79 | Temp 97.6°F | Resp 20 | Ht 73.0 in | Wt 207.8 lb

## 2013-02-08 DIAGNOSIS — C155 Malignant neoplasm of lower third of esophagus: Secondary | ICD-10-CM

## 2013-02-08 DIAGNOSIS — C159 Malignant neoplasm of esophagus, unspecified: Secondary | ICD-10-CM

## 2013-02-08 DIAGNOSIS — Z5111 Encounter for antineoplastic chemotherapy: Secondary | ICD-10-CM

## 2013-02-08 LAB — CBC WITH DIFFERENTIAL/PLATELET
BASO%: 0.8 % (ref 0.0–2.0)
EOS%: 2 % (ref 0.0–7.0)
HCT: 39.8 % (ref 38.4–49.9)
LYMPH%: 14.5 % (ref 14.0–49.0)
MCH: 31.2 pg (ref 27.2–33.4)
MCHC: 34.8 g/dL (ref 32.0–36.0)
MCV: 89.6 fL (ref 79.3–98.0)
MONO%: 11.1 % (ref 0.0–14.0)
NEUT%: 71.6 % (ref 39.0–75.0)
lymph#: 0.4 10*3/uL — ABNORMAL LOW (ref 0.9–3.3)

## 2013-02-08 MED ORDER — ONDANSETRON 16 MG/50ML IVPB (CHCC)
16.0000 mg | Freq: Once | INTRAVENOUS | Status: AC
  Administered 2013-02-08: 16 mg via INTRAVENOUS

## 2013-02-08 MED ORDER — FAMOTIDINE IN NACL 20-0.9 MG/50ML-% IV SOLN
20.0000 mg | Freq: Once | INTRAVENOUS | Status: AC
  Administered 2013-02-08: 20 mg via INTRAVENOUS

## 2013-02-08 MED ORDER — SODIUM CHLORIDE 0.9 % IV SOLN
208.0000 mg | Freq: Once | INTRAVENOUS | Status: AC
  Administered 2013-02-08: 210 mg via INTRAVENOUS
  Filled 2013-02-08: qty 21

## 2013-02-08 MED ORDER — DIPHENHYDRAMINE HCL 50 MG/ML IJ SOLN
25.0000 mg | Freq: Once | INTRAMUSCULAR | Status: AC
  Administered 2013-02-08: 25 mg via INTRAVENOUS

## 2013-02-08 MED ORDER — PACLITAXEL CHEMO INJECTION 300 MG/50ML
50.0000 mg/m2 | Freq: Once | INTRAVENOUS | Status: AC
  Administered 2013-02-08: 114 mg via INTRAVENOUS
  Filled 2013-02-08: qty 19

## 2013-02-08 MED ORDER — DEXAMETHASONE SODIUM PHOSPHATE 10 MG/ML IJ SOLN
5.0000 mg | Freq: Once | INTRAMUSCULAR | Status: AC
  Administered 2013-02-08: 5 mg via INTRAVENOUS

## 2013-02-08 MED ORDER — SODIUM CHLORIDE 0.9 % IV SOLN
Freq: Once | INTRAVENOUS | Status: AC
  Administered 2013-02-08: 13:00:00 via INTRAVENOUS

## 2013-02-08 NOTE — Patient Instructions (Addendum)
[facility] Discharge Instructions for Patients Receiving Chemotherapy  Today you received the following chemotherapy agents :  Taxol,  Carboplatin.  To help prevent nausea and vomiting after your treatment, we encourage you to take your nausea medication as instructed by your physician.    If you develop nausea and vomiting that is not controlled by your nausea medication, call the clinic. If it is after clinic hours your family physician or the after hours number for the clinic or go to the Emergency Department.   BELOW ARE SYMPTOMS THAT SHOULD BE REPORTED IMMEDIATELY:  *FEVER GREATER THAN 100.5 F  *CHILLS WITH OR WITHOUT FEVER  NAUSEA AND VOMITING THAT IS NOT CONTROLLED WITH YOUR NAUSEA MEDICATION  *UNUSUAL SHORTNESS OF BREATH  *UNUSUAL BRUISING OR BLEEDING  TENDERNESS IN MOUTH AND THROAT WITH OR WITHOUT PRESENCE OF ULCERS  *URINARY PROBLEMS  *BOWEL PROBLEMS  UNUSUAL RASH Items with * indicate a potential emergency and should be followed up as soon as possible.  One of the nurses will contact you 24 hours after your treatment. Please let the nurse know about any problems that you may have experienced. Feel free to call the clinic you have any questions or concerns. The clinic phone number is [phone].   I have been informed and understand all the instructions given to me. I know to contact the clinic, my physician, or go to the Emergency Department if any problems should occur. I do not have any questions at this time, but understand that I may call the clinic during office hours   should I have any questions or need assistance in obtaining follow up care.    __________________________________________  _____________  __________ Signature of Patient or Authorized Representative            Date                   Time    __________________________________________ Nurse's Signature    

## 2013-02-08 NOTE — Telephone Encounter (Signed)
Gave pt appt for lab, MD and emailed Marcelino Duster regarding chemo for March, 2014

## 2013-02-08 NOTE — Progress Notes (Signed)
   [facility]    OFFICE PROGRESS NOTE   INTERVAL HISTORY:   He returns as scheduled. He has completed 2 weeks of Taxol/carboplatin and concurrent radiation. He reports tolerating the treatment well. No nausea, and neuropathy symptoms, or odynophagia. The blood sugar has become more elevated and he was started on Lantus by Dr. Timothy Lasso. Good appetite. No dysphagia.  Objective:  Vital signs in last 24 hours:  Blood pressure 112/80, pulse 79, temperature 97.6 F (36.4 C), temperature source Oral, resp. rate 20, height 6\' 1"  (1.854 m), weight 207 lb 12.8 oz (94.257 kg).    HEENT: No thrush or ulcers Resp: Lungs clear bilaterally Cardio: Regular rate and rhythm GI: Nontender, no hepatomegaly Vascular: No leg edema  Skin: No rash     Lab Results:  Lab Results  Component Value Date   WBC 2.7* 02/08/2013   HGB 13.8 02/08/2013   HCT 39.8 02/08/2013   MCV 89.6 02/08/2013   PLT 169 02/08/2013   ANC 2.0    Medications: I have reviewed the patient's current medications.  Assessment/Plan: 1. 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 History of Barrett's esophagus  3. Adult onset diabetes-the blood sugar has been higher since starting chemotherapy. This is likely related to Decadron therapy. We decrease the Decadron premedication to 5 mg today. He continues to monitor the blood sugar and will followup with Dr. Timothy Lasso.  Disposition:  Mr. Manheim appears to be tolerating the chemotherapy and radiation well. He will complete 3 more weeks of Taxol/carboplatin chemotherapy.  Mr. [place] sees Dr. Roselind Messier each week. He will return for an office visit here during the week of 03/12/2013. He plans to schedule followup imaging and surgery with Dr. Ewing Schlein. He plans to schedule an appointment with Dr. Ewing Schlein within a few weeks of the completion of  chemotherapy/radiation.   Thornton Papas, MD  02/08/2013  12:02 PM

## 2013-02-08 NOTE — Progress Notes (Signed)
Patient reports he feels well. He has had some elevated blood sugars. These are likely from treatment with Decadron. His physician has put him on Lantus and this has resulted in improved blood sugar control. Weight has declined slightly to 207.8 pounds on February 27 from 213.7 pounds February 18.  Patient attributes weight loss to inadequate glycemic control as well as dietary changes.  Nutrition diagnosis: Food and nutrition related knowledge deficit improved.  Intervention: I educated patient to continue higher protein, complex carbohydrate foods frequently throughout the day. He is to continue medication as prescribed by his physician. He understands his goal is weight maintenance.  Monitoring, evaluation, goals: Patient will tolerate adequate calories and protein for weight maintenance and adequate glycemic control.  Next visit: Thursday, March 6, during chemotherapy.

## 2013-02-09 ENCOUNTER — Ambulatory Visit
Admission: RE | Admit: 2013-02-09 | Discharge: 2013-02-09 | Disposition: A | Discharge status: Home / Self Care | Payer: Medicare PPO | Source: Ambulatory Visit | Attending: Radiation Oncology | Admitting: Radiation Oncology

## 2013-02-12 ENCOUNTER — Ambulatory Visit
Admission: RE | Admit: 2013-02-12 | Discharge: 2013-02-12 | Disposition: A | Discharge status: Home / Self Care | Payer: Medicare PPO | Source: Ambulatory Visit | Attending: Radiation Oncology | Admitting: Radiation Oncology

## 2013-02-13 ENCOUNTER — Ambulatory Visit
Admission: RE | Admit: 2013-02-13 | Discharge: 2013-02-13 | Disposition: A | Discharge status: Home / Self Care | Payer: Medicare PPO | Source: Ambulatory Visit | Attending: Radiation Oncology | Admitting: Radiation Oncology

## 2013-02-13 ENCOUNTER — Encounter: Payer: Self-pay | Admitting: Radiation Oncology

## 2013-02-13 VITALS — BP 114/75 | HR 108 | Resp 18 | Wt 210.3 lb

## 2013-02-13 DIAGNOSIS — C159 Malignant neoplasm of esophagus, unspecified: Secondary | ICD-10-CM

## 2013-02-13 NOTE — Progress Notes (Signed)
Patient presents to the clinic today for PUT with Dr. Roselind Messier. Patient is alert and oriented to person, place, and time. No distress noted. Steady gait noted. Pleasant affect noted. Patient denies pain at this time. Patient reports that he felt very fatigued Sunday. However, today patient reports that he feels much better. Patient reports that over the weekend his appetite was decreased but, has greatly improved. Patient reports eating two bbq sandwiches last night. Patient denies painful or difficulty swallowing. Patient denies nausea, vomiting, headache or dizziness. Patient denies cough or shortness of breath. Reported all findings to Dr. Roselind Messier.

## 2013-02-13 NOTE — Progress Notes (Signed)
  Radiation Oncology         [phone] ________________________________  Name: Matthew White MRN: [id number]  Date: 02/13/2013  DOB: 03/03/1934  Weekly Radiation Therapy Management  Current Dose: 27 Gy     Planned Dose:  45 Gy  Narrative . . . . . . . . The patient presents for routine under treatment assessment.                                                     The patient is without complaint. Continues to take Carafate. He continues to eat the solid foods.                                 Set-up films were reviewed.                                 The chart was checked. Physical Findings. . .  weight is 210 lb 4.8 oz (95.391 kg). His blood pressure is 114/75 and his pulse is 108. His respiration is 18. . Weight essentially stable.   Impression . . . . . . . The patient is  tolerating radiation. Plan . . . . . . . . . . . . Continue treatment as planned.  ________________________________  -----------------------------------  Billie Lade, PhD, MD

## 2013-02-14 ENCOUNTER — Ambulatory Visit
Admission: RE | Admit: 2013-02-14 | Discharge: 2013-02-14 | Disposition: A | Discharge status: Home / Self Care | Payer: Medicare PPO | Source: Ambulatory Visit | Attending: Radiation Oncology | Admitting: Radiation Oncology

## 2013-02-15 ENCOUNTER — Other Ambulatory Visit: Payer: Self-pay | Admitting: Oncology

## 2013-02-15 ENCOUNTER — Ambulatory Visit: Payer: Medicare PPO | Admitting: Nutrition

## 2013-02-15 ENCOUNTER — Other Ambulatory Visit (HOSPITAL_BASED_OUTPATIENT_CLINIC_OR_DEPARTMENT_OTHER): Payer: Medicare PPO | Admitting: Lab

## 2013-02-15 ENCOUNTER — Ambulatory Visit (HOSPITAL_BASED_OUTPATIENT_CLINIC_OR_DEPARTMENT_OTHER): Payer: Medicare PPO

## 2013-02-15 ENCOUNTER — Ambulatory Visit
Admission: RE | Admit: 2013-02-15 | Discharge: 2013-02-15 | Disposition: A | Discharge status: Home / Self Care | Payer: Medicare PPO | Source: Ambulatory Visit | Attending: Radiation Oncology | Admitting: Radiation Oncology

## 2013-02-15 VITALS — BP 93/56 | HR 99 | Temp 97.0°F | Resp 20

## 2013-02-15 DIAGNOSIS — Z5111 Encounter for antineoplastic chemotherapy: Secondary | ICD-10-CM

## 2013-02-15 DIAGNOSIS — C159 Malignant neoplasm of esophagus, unspecified: Secondary | ICD-10-CM

## 2013-02-15 DIAGNOSIS — C155 Malignant neoplasm of lower third of esophagus: Secondary | ICD-10-CM

## 2013-02-15 LAB — CBC WITH DIFFERENTIAL/PLATELET
BASO%: 0.3 % (ref 0.0–2.0)
EOS%: 2.7 % (ref 0.0–7.0)
HCT: 40.5 % (ref 38.4–49.9)
LYMPH%: 16.4 % (ref 14.0–49.0)
MCH: 30.5 pg (ref 27.2–33.4)
MCHC: 34.6 g/dL (ref 32.0–36.0)
MONO#: 0.4 10*3/uL (ref 0.1–0.9)
NEUT%: 66.2 % (ref 39.0–75.0)
Platelets: 147 10*3/uL (ref 140–400)
RBC: 4.59 10*6/uL (ref 4.20–5.82)
WBC: 2.9 10*3/uL — ABNORMAL LOW (ref 4.0–10.3)
nRBC: 0 % (ref 0–0)

## 2013-02-15 LAB — BASIC METABOLIC PANEL (CC13)
BUN: 18.3 mg/dL (ref 7.0–26.0)
CO2: 24 mEq/L (ref 22–29)
Calcium: 10.1 mg/dL (ref 8.4–10.4)
Glucose: 124 mg/dl — ABNORMAL HIGH (ref 70–99)
Potassium: 4.1 mEq/L (ref 3.5–5.1)
Sodium: 140 mEq/L (ref 136–145)

## 2013-02-15 MED ORDER — SODIUM CHLORIDE 0.9 % IV SOLN
Freq: Once | INTRAVENOUS | Status: AC
  Administered 2013-02-15: 11:00:00 via INTRAVENOUS

## 2013-02-15 MED ORDER — DEXAMETHASONE SODIUM PHOSPHATE 10 MG/ML IJ SOLN
5.0000 mg | Freq: Once | INTRAMUSCULAR | Status: AC
  Administered 2013-02-15: 5 mg via INTRAVENOUS

## 2013-02-15 MED ORDER — DIPHENHYDRAMINE HCL 50 MG/ML IJ SOLN
25.0000 mg | Freq: Once | INTRAMUSCULAR | Status: AC
  Administered 2013-02-15: 25 mg via INTRAVENOUS

## 2013-02-15 MED ORDER — FAMOTIDINE IN NACL 20-0.9 MG/50ML-% IV SOLN
20.0000 mg | Freq: Once | INTRAVENOUS | Status: AC
  Administered 2013-02-15: 20 mg via INTRAVENOUS

## 2013-02-15 MED ORDER — PACLITAXEL CHEMO INJECTION 300 MG/50ML
50.0000 mg/m2 | Freq: Once | INTRAVENOUS | Status: AC
  Administered 2013-02-15: 114 mg via INTRAVENOUS
  Filled 2013-02-15: qty 19

## 2013-02-15 MED ORDER — SODIUM CHLORIDE 0.9 % IV SOLN
210.0000 mg | Freq: Once | INTRAVENOUS | Status: AC
  Administered 2013-02-15: 210 mg via INTRAVENOUS
  Filled 2013-02-15: qty 21

## 2013-02-15 MED ORDER — ONDANSETRON 16 MG/50ML IVPB (CHCC)
16.0000 mg | Freq: Once | INTRAVENOUS | Status: AC
  Administered 2013-02-15: 16 mg via INTRAVENOUS

## 2013-02-15 NOTE — Progress Notes (Signed)
Patient reports that he has felt more fatigued. His fasting blood sugar this morning was 170. His weight has increased to 210.3 pounds on March 4 from 207.8 pounds February 27. He continues to take Carafate. Patient has additional questions today regarding strategies for eating during chemotherapy.  Nutrition diagnosis: Food and nutrition related knowledge deficit improved.  Intervention: Patient was educated to continue exercising as tolerated. He was encouraged to continue small amounts of food more often throughout the day for increased appetite and energy. I've reinforced the importance of weight maintenance throughout treatment. We've discussed the importance of higher fiber foods in healthy diet for improved glycemic control. I have reinforced the importance of adequate calories and protein. Teach back method used.  Monitoring, evaluation, goals: Patient will continue to tolerate healthy diet for weight maintenance and adequate glycemic control.  Next visit: Thursday, March 13, during chemotherapy.

## 2013-02-15 NOTE — Patient Instructions (Addendum)
[facility] Discharge Instructions for Patients Receiving Chemotherapy  Today you received the following chemotherapy agents Taxol and Carbo.  To help prevent nausea and vomiting after your treatment, we encourage you to take your nausea medication as prescribed. .   If you develop nausea and vomiting that is not controlled by your nausea medication, call the clinic. If it is after clinic hours your family physician or the after hours number for the clinic or go to the Emergency Department.   BELOW ARE SYMPTOMS THAT SHOULD BE REPORTED IMMEDIATELY:  *FEVER GREATER THAN 100.5 F  *CHILLS WITH OR WITHOUT FEVER  NAUSEA AND VOMITING THAT IS NOT CONTROLLED WITH YOUR NAUSEA MEDICATION  *UNUSUAL SHORTNESS OF BREATH  *UNUSUAL BRUISING OR BLEEDING  TENDERNESS IN MOUTH AND THROAT WITH OR WITHOUT PRESENCE OF ULCERS  *URINARY PROBLEMS  *BOWEL PROBLEMS  UNUSUAL RASH Items with * indicate a potential emergency and should be followed up as soon as possible.   Please let the nurse know about any problems that you may have experienced. Feel free to call the clinic you have any questions or concerns. The clinic phone number is [phone].   I have been informed and understand all the instructions given to me. I know to contact the clinic, my physician, or go to the Emergency Department if any problems should occur. I do not have any questions at this time, but understand that I may call the clinic during office hours   should I have any questions or need assistance in obtaining follow up care.    __________________________________________  _____________  __________ Signature of Patient or Authorized Representative            Date                   Time    __________________________________________ Nurse's Signature    

## 2013-02-16 ENCOUNTER — Ambulatory Visit
Admission: RE | Admit: 2013-02-16 | Discharge: 2013-02-16 | Disposition: A | Discharge status: Home / Self Care | Payer: Medicare PPO | Source: Ambulatory Visit | Attending: Radiation Oncology | Admitting: Radiation Oncology

## 2013-02-19 ENCOUNTER — Ambulatory Visit
Admission: RE | Admit: 2013-02-19 | Discharge: 2013-02-19 | Disposition: A | Discharge status: Home / Self Care | Payer: Medicare PPO | Source: Ambulatory Visit | Attending: Radiation Oncology | Admitting: Radiation Oncology

## 2013-02-19 ENCOUNTER — Other Ambulatory Visit: Payer: Self-pay | Admitting: Oncology

## 2013-02-20 ENCOUNTER — Encounter: Payer: Self-pay | Admitting: Radiation Oncology

## 2013-02-20 ENCOUNTER — Ambulatory Visit
Admission: RE | Admit: 2013-02-20 | Discharge: 2013-02-20 | Disposition: A | Discharge status: Home / Self Care | Payer: Medicare PPO | Source: Ambulatory Visit | Attending: Radiation Oncology | Admitting: Radiation Oncology

## 2013-02-20 VITALS — BP 108/74 | HR 104 | Resp 18 | Wt 205.5 lb

## 2013-02-20 DIAGNOSIS — C159 Malignant neoplasm of esophagus, unspecified: Secondary | ICD-10-CM
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
## 2013-02-21 ENCOUNTER — Ambulatory Visit
Admission: RE | Admit: 2013-02-21 | Discharge: 2013-02-21 | Disposition: A | Discharge status: Home / Self Care | Payer: Medicare PPO | Source: Ambulatory Visit | Attending: Radiation Oncology | Admitting: Radiation Oncology

## 2013-02-22 ENCOUNTER — Ambulatory Visit: Payer: Medicare PPO | Admitting: Nutrition

## 2013-02-22 ENCOUNTER — Other Ambulatory Visit (HOSPITAL_BASED_OUTPATIENT_CLINIC_OR_DEPARTMENT_OTHER): Payer: Medicare PPO

## 2013-02-22 ENCOUNTER — Ambulatory Visit (HOSPITAL_BASED_OUTPATIENT_CLINIC_OR_DEPARTMENT_OTHER): Payer: Medicare PPO

## 2013-02-22 ENCOUNTER — Ambulatory Visit
Admission: RE | Admit: 2013-02-22 | Discharge: 2013-02-22 | Disposition: A | Discharge status: Home / Self Care | Payer: Medicare PPO | Source: Ambulatory Visit | Attending: Radiation Oncology | Admitting: Radiation Oncology

## 2013-02-22 VITALS — BP 87/60 | HR 96 | Temp 98.7°F

## 2013-02-22 DIAGNOSIS — C155 Malignant neoplasm of lower third of esophagus: Secondary | ICD-10-CM

## 2013-02-22 DIAGNOSIS — C159 Malignant neoplasm of esophagus, unspecified: Secondary | ICD-10-CM

## 2013-02-22 LAB — CBC WITH DIFFERENTIAL/PLATELET
BASO%: 0.6 % (ref 0.0–2.0)
EOS%: 2.5 % (ref 0.0–7.0)
HCT: 39.6 % (ref 38.4–49.9)
LYMPH%: 11.6 % — ABNORMAL LOW (ref 14.0–49.0)
MCH: 31 pg (ref 27.2–33.4)
MCHC: 35.1 g/dL (ref 32.0–36.0)
MONO%: 16.6 % — ABNORMAL HIGH (ref 0.0–14.0)
NEUT%: 68.7 % (ref 39.0–75.0)
Platelets: 128 10*3/uL — ABNORMAL LOW (ref 140–400)

## 2013-02-22 MED ORDER — SODIUM CHLORIDE 0.9 % IV SOLN
210.0000 mg | Freq: Once | INTRAVENOUS | Status: AC
  Administered 2013-02-22: 210 mg via INTRAVENOUS
  Filled 2013-02-22: qty 21

## 2013-02-22 MED ORDER — FAMOTIDINE IN NACL 20-0.9 MG/50ML-% IV SOLN
20.0000 mg | Freq: Once | INTRAVENOUS | Status: AC
  Administered 2013-02-22: 20 mg via INTRAVENOUS

## 2013-02-22 MED ORDER — SODIUM CHLORIDE 0.9 % IV SOLN
Freq: Once | INTRAVENOUS | Status: AC
  Administered 2013-02-22: 11:00:00 via INTRAVENOUS

## 2013-02-22 MED ORDER — ONDANSETRON 16 MG/50ML IVPB (CHCC)
16.0000 mg | Freq: Once | INTRAVENOUS | Status: AC
  Administered 2013-02-22: 16 mg via INTRAVENOUS

## 2013-02-22 MED ORDER — DIPHENHYDRAMINE HCL 50 MG/ML IJ SOLN
25.0000 mg | Freq: Once | INTRAMUSCULAR | Status: AC
  Administered 2013-02-22: 25 mg via INTRAVENOUS

## 2013-02-22 MED ORDER — DEXAMETHASONE SODIUM PHOSPHATE 10 MG/ML IJ SOLN
5.0000 mg | Freq: Once | INTRAMUSCULAR | Status: AC
  Administered 2013-02-22: 5 mg via INTRAVENOUS

## 2013-02-22 MED ORDER — DEXTROSE 5 % IV SOLN
50.0000 mg/m2 | Freq: Once | INTRAVENOUS | Status: AC
  Administered 2013-02-22: 114 mg via INTRAVENOUS
  Filled 2013-02-22: qty 19

## 2013-02-22 NOTE — Patient Instructions (Addendum)
[facility] Discharge Instructions for Patients Receiving Chemotherapy  Today you received the following chemotherapy agents Taxol Carbo To help prevent nausea and vomiting after your treatment, we encourage you to take your nausea medication as prescribed Begin taking it as directed and take it as often as prescribed for the next 48-72 hours.   If you develop nausea and vomiting that is not controlled by your nausea medication, call the clinic. If it is after clinic hours your family physician or the after hours number for the clinic or go to the Emergency Department.   BELOW ARE SYMPTOMS THAT SHOULD BE REPORTED IMMEDIATELY:  *FEVER GREATER THAN 100.5 F  *CHILLS WITH OR WITHOUT FEVER  NAUSEA AND VOMITING THAT IS NOT CONTROLLED WITH YOUR NAUSEA MEDICATION  *UNUSUAL SHORTNESS OF BREATH  *UNUSUAL BRUISING OR BLEEDING  TENDERNESS IN MOUTH AND THROAT WITH OR WITHOUT PRESENCE OF ULCERS  *URINARY PROBLEMS  *BOWEL PROBLEMS  UNUSUAL RASH Items with * indicate a potential emergency and should be followed up as soon as possible.  One of the nurses will contact you 24 hours after your treatment. Please let the nurse know about any problems that you may have experienced. Feel free to call the clinic you have any questions or concerns. The clinic phone number is [phone].   I have been informed and understand all the instructions given to me. I know to contact the clinic, my physician, or go to the Emergency Department if any problems should occur. I do not have any questions at this time, but understand that I may call the clinic during office hours   should I have any questions or need assistance in obtaining follow up care.    __________________________________________  _____________  __________ Signature of Patient or Authorized Representative            Date                   Time    __________________________________________ Nurse's Signature

## 2013-02-22 NOTE — Progress Notes (Signed)
Pt states that his B/P is normally low and he has no side effects from it.

## 2013-02-22 NOTE — Progress Notes (Signed)
I spoke with patient in chemotherapy. He reports he has had increased fatigue. He has an uncomfortable feeling in his esophagus, feeling like he needs to belch. His weight has declined to 205.5 pounds on March 11 from 210.3 pounds March 4. This is patient's last chemotherapy today.  Nutrition diagnosis: Food and nutrition related knowledge deficit resolved.  I provided support and encouragement for patient to continue small frequent meals with oral nutrition supplements as needed to ensure good nutrition status prior surgery. We discussed the importance of continued glycemic control. I provided additional coupons for patient to use. Patient will contact me for further questions concerns or problems. He has my contact information.

## 2013-02-23 ENCOUNTER — Ambulatory Visit
Admission: RE | Admit: 2013-02-23 | Discharge: 2013-02-23 | Disposition: A | Discharge status: Home / Self Care | Payer: Medicare PPO | Source: Ambulatory Visit | Attending: Radiation Oncology | Admitting: Radiation Oncology

## 2013-02-26 ENCOUNTER — Ambulatory Visit
Admission: RE | Admit: 2013-02-26 | Discharge: 2013-02-26 | Disposition: A | Discharge status: Home / Self Care | Payer: Medicare PPO | Source: Ambulatory Visit | Attending: Radiation Oncology | Admitting: Radiation Oncology

## 2013-02-27 ENCOUNTER — Ambulatory Visit
Admission: RE | Admit: 2013-02-27 | Discharge: 2013-02-27 | Disposition: A | Discharge status: Home / Self Care | Payer: Medicare PPO | Source: Ambulatory Visit | Attending: Radiation Oncology | Admitting: Radiation Oncology

## 2013-02-27 ENCOUNTER — Ambulatory Visit (HOSPITAL_BASED_OUTPATIENT_CLINIC_OR_DEPARTMENT_OTHER): Payer: Medicare PPO

## 2013-02-27 ENCOUNTER — Encounter: Payer: Self-pay | Admitting: Radiation Oncology

## 2013-02-27 VITALS — BP 88/63 | HR 128 | Resp 16 | Wt 198.8 lb

## 2013-02-27 VITALS — BP 92/63 | HR 120 | Temp 98.7°F | Resp 20

## 2013-02-27 DIAGNOSIS — C159 Malignant neoplasm of esophagus, unspecified: Secondary | ICD-10-CM

## 2013-02-27 DIAGNOSIS — E860 Dehydration: Secondary | ICD-10-CM

## 2013-02-27 LAB — BASIC METABOLIC PANEL (CC13)
CO2: 26 mEq/L (ref 22–29)
Chloride: 104 mEq/L (ref 98–107)
Creatinine: 1.2 mg/dL (ref 0.7–1.3)
Potassium: 4.2 mEq/L (ref 3.5–5.1)
Sodium: 140 mEq/L (ref 136–145)

## 2013-02-27 MED ORDER — SODIUM CHLORIDE 0.9 % IV SOLN
Freq: Once | INTRAVENOUS | Status: DC
  Filled 2013-02-27: qty 1000

## 2013-02-27 MED ORDER — SODIUM CHLORIDE 0.9 % IV SOLN
Freq: Once | INTRAVENOUS | Status: AC
  Administered 2013-02-27: 13:00:00 via INTRAVENOUS

## 2013-02-27 NOTE — Patient Instructions (Addendum)
Dehydration, Adult Dehydration is when you lose more fluids from the body than you take in. Vital organs like the kidneys, brain, and heart cannot function without a proper amount of fluids and salt. Any loss of fluids from the body can cause dehydration.  CAUSES   Vomiting.  Diarrhea.  Excessive sweating.  Excessive urine output.  Fever. SYMPTOMS  Mild dehydration  Thirst.  Dry lips.  Slightly dry mouth. Moderate dehydration  Very dry mouth.  Sunken eyes.  Skin does not bounce back quickly when lightly pinched and released.  Dark urine and decreased urine production.  Decreased tear production.  Headache. Severe dehydration  Very dry mouth.  Extreme thirst.  Rapid, weak pulse (more than 100 beats per minute at rest).  Cold hands and feet.  Not able to sweat in spite of heat and temperature.  Rapid breathing.  Blue lips.  Confusion and lethargy.  Difficulty being awakened.  Minimal urine production.  No tears. DIAGNOSIS  Your caregiver will diagnose dehydration based on your symptoms and your exam. Blood and urine tests will help confirm the diagnosis. The diagnostic evaluation should also identify the cause of dehydration. TREATMENT  Treatment of mild or moderate dehydration can often be done at home by increasing the amount of fluids that you drink. It is best to drink small amounts of fluid more often. Drinking too much at one time can make vomiting worse. Refer to the home care instructions below. Severe dehydration needs to be treated at the hospital where you will probably be given intravenous (IV) fluids that contain water and electrolytes. HOME CARE INSTRUCTIONS   Ask your caregiver about specific rehydration instructions.  Drink enough fluids to keep your urine clear or pale yellow.  Drink small amounts frequently if you have nausea and vomiting.  Eat as you normally do.  Avoid:  Foods or drinks high in sugar.  Carbonated  drinks.  Juice.  Extremely hot or cold fluids.  Drinks with caffeine.  Fatty, greasy foods.  Alcohol.  Tobacco.  Overeating.  Gelatin desserts.  Wash your hands well to avoid spreading bacteria and viruses.  Only take over-the-counter or prescription medicines for pain, discomfort, or fever as directed by your caregiver.  Ask your caregiver if you should continue all prescribed and over-the-counter medicines.  Keep all follow-up appointments with your caregiver. SEEK MEDICAL CARE IF:  You have abdominal pain and it increases or stays in one area (localizes).  You have a rash, stiff neck, or severe headache.  You are irritable, sleepy, or difficult to awaken.  You are weak, dizzy, or extremely thirsty. SEEK IMMEDIATE MEDICAL CARE IF:   You are unable to keep fluids down or you get worse despite treatment.  You have frequent episodes of vomiting or diarrhea.  You have blood or green matter (bile) in your vomit.  You have blood in your stool or your stool looks black and tarry.  You have not urinated in 6 to 8 hours, or you have only urinated a small amount of very dark urine.  You have a fever.  You faint. MAKE SURE YOU:   Understand these instructions.  Will watch your condition.  Will get help right away if you are not doing well or get worse. Document Released: 11/29/2005 Document Revised: 02/21/2012 Document Reviewed: 07/19/2011 Exit[employer] Patient Information 2013 [employer].  

## 2013-02-27 NOTE — Progress Notes (Signed)
[facility]    Radiation Oncology [address] [place]     Maryln Gottron, M.D. [place], [place] [address]               Billie Lade, M.D., Ph.D. Phone: [phone]      Molli Hazard A. Kathrynn Running, M.D. Fax: [phone]      Radene Gunning, M.D., Ph.D.         Lurline Hare, M.D.         Grayland Jack, M.D Weekly Treatment Management Note  Name: Matthew White     MRN: [id number]        CSN: [id number] Date: 02/27/2013      DOB: 12/05/1934  CC: Matthew Pounds, MD         Timothy Lasso    Status: Outpatient  Diagnosis: The encounter diagnosis was Esophageal cancer.  Current Dose: 45  Current Fraction: 25  Planned Dose: 45 Gy  Narrative: Matthew White was seen today for weekly treatment management. The chart was checked and MVCT  were reviewed. The past week he has noticed a "" full feeling" in his esophagus. This is interfering with his by mouth intake. He does have fatigue at this time and some dizziness with standing initially.  Ivp dye  Current Outpatient Prescriptions  Medication Sig Dispense Refill  . allopurinol (ZYLOPRIM) 100 MG tablet Take 50 mg by mouth daily.      Marland Kitchen ALPRAZolam (XANAX) 1 MG tablet Take 1 mg by mouth at bedtime as needed. For sleep      . BD ULTRA-FINE PEN NEEDLES 29G X 12.7MM MISC       . dexamethasone (DECADRON) 4 MG tablet       . glimepiride (AMARYL) 2 MG tablet Take 2 mg by mouth daily before breakfast.      . insulin glargine (LANTUS) 100 UNIT/ML injection Inject 10 Units into the skin at bedtime.      . metFORMIN (GLUCOPHAGE) 500 MG tablet Take 500 mg by mouth 2 (two) times daily with a meal.      . omeprazole (PRILOSEC) 20 MG capsule Take 20 mg by mouth 2 (two) times daily.      . prochlorperazine (COMPAZINE) 10 MG tablet Take 1 tablet (10 mg total) by mouth every 6 (six) hours as needed (nausea).  60 tablet  1  . simvastatin (ZOCOR) 10 MG tablet Take 10 mg by mouth at bedtime.      . sucralfate (CARAFATE) 1 GM/10ML suspension Take 10 mLs  (1 g total) by mouth 4 (four) times daily.  420 mL  2  . Wound Cleansers (RADIAPLEX EX) Apply topically.       Current Facility-Administered Medications  Medication Dose Route Frequency Provider Last Rate Last Dose  . 0.9 %  sodium chloride infusion   Intravenous Once Billie Lade, MD       Labs:  Lab Results  Component Value Date   WBC 3.2* 02/22/2013   HGB 13.9 02/22/2013   HCT 39.6 02/22/2013   MCV 88.4 02/22/2013   PLT 128* 02/22/2013   Lab Results  Component Value Date   CREATININE 1.2 02/27/2013   BUN 22.4 02/27/2013   NA 140 02/27/2013   K 4.2 02/27/2013   CL 104 02/27/2013   CO2 26 02/27/2013   Lab Results  Component Value Date   ALT 21 01/23/2013   AST 16 01/23/2013   BILITOT 0.63 01/23/2013    Physical Examination:  weight is 198 lb  12.8 oz (90.175 kg). His blood pressure is 88/63 and his pulse is 128. His respiration is 16.    Wt Readings from Last 3 Encounters:  02/27/13 198 lb 12.8 oz (90.175 kg)  02/20/13 205 lb 8 oz (93.214 kg)  02/13/13 210 lb 4.8 oz (95.391 kg)    The oral cavity is mildly dry without secondary infection. Lungs - Normal respiratory effort, chest expands symmetrically. Lungs are clear to auscultation, no crackles or wheezes.  Heart has regular rhythm with increased rate Abdomen is soft and non tender with normal bowel sounds  Assessment:  Patient is having more side effects with this treatment. He does show orthostatic changes with pulse and blood pressure.  Plan: The patient completed his radiation therapy today. He will be set up for IV fluids today. He will return for close followup next week and may require additional IV fluid supplementation. He is scheduled to see doctors at [facility] in early April for planning for his surgery.

## 2013-02-27 NOTE — Progress Notes (Signed)
Patient presents to the clinic today unaccompanied for PUT with Dr. Roselind Messier. Patient is alert and oriented to person, place, and time. No distress noted. Steady gait noted. Pleasant affect. Patient denies pain at this time. Patient reports fatigue. Patient reports a chronic productive cough from post nasal drip. Patient reports decreased appetite. Patient has lost seven pounds since 02/20/2013. Patient reports that he was only able to get 3/4 boost down. Patient states, "I am unable to eat."Patient denies painful or difficulty swallowing. Patient reports that there is a knot in his stomach. Patient denies nausea but, reports taking antiemetic to ease quezy stomach. Patient reports fullness in esophagus. Patient denies sores or ulcerations of mouth. Patient reports that he has to take xanax to sleep. Reported all finings to Dr. Roselind Messier.

## 2013-02-28 ENCOUNTER — Ambulatory Visit: Payer: Medicare PPO

## 2013-02-28 ENCOUNTER — Telehealth: Payer: Self-pay | Admitting: Radiation Oncology

## 2013-02-28 ENCOUNTER — Encounter: Payer: Self-pay | Admitting: Radiation Oncology

## 2013-02-28 NOTE — Progress Notes (Signed)
  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 He did require IVF supplementation on his last tx day.  Total weight loss was 12 lbs.  Plan: The patient has completed radiation treatment. The patient will return to radiation oncology clinic for followup in 10 days. I advised them to call or return sooner if they have any questions or concerns related to their recovery or treatment. The patient has decided to have surgery at [facility] to be closer to his children during recuperation.  -----------------------------------  Billie Lade, PhD, MD

## 2013-02-28 NOTE — Telephone Encounter (Signed)
Left message on patient's answering machine reminding him of follow up appointment with Dr. Roselind Messier on 03/12/13 at 2:00 pm.

## 2013-03-01 ENCOUNTER — Ambulatory Visit: Payer: Medicare PPO

## 2013-03-02 ENCOUNTER — Ambulatory Visit: Payer: Medicare PPO

## 2013-03-05 ENCOUNTER — Telehealth: Payer: Self-pay | Admitting: Radiation Oncology

## 2013-03-05 ENCOUNTER — Ambulatory Visit: Payer: Medicare PPO

## 2013-03-05 NOTE — Telephone Encounter (Signed)
Patient's significant other phoned this morning concerned about husband's abdominal pain and lack of oral intake. She states, "he isn't eating; I have tried everything...pudding, smoothies etc." She goes on to explain that he will not eat because of the pain he feels "when the food gets to his stomach." She reports this abdominal pain is at the "end of his esophagus." She confirms it is intermittent but, when it "comes he is doubled over in pain." She reports, "his esophagus is fine." She reports that he "thinks he had a BM yesterday." She reports that he has a frequent cough. She reports that yesterday he ran a temperature of 100 F but, denies he has a fever today. She denies that he complains of nausea or vomiting. Patient is scheduled to follow up with Dr. Roselind Messier on Monday and [facility] doctors on Thursday. Patient's wife, Kriste Basque, reports that she has been giving him some of her old Vicodin for the pain and "it helps." Reported all findings to Dr. Roselind Messier and will call patient back with further direction.

## 2013-03-06 ENCOUNTER — Other Ambulatory Visit: Payer: Self-pay | Admitting: Radiation Oncology

## 2013-03-06 ENCOUNTER — Ambulatory Visit: Payer: Medicare PPO

## 2013-03-06 ENCOUNTER — Ambulatory Visit (HOSPITAL_BASED_OUTPATIENT_CLINIC_OR_DEPARTMENT_OTHER): Payer: Medicare PPO

## 2013-03-06 ENCOUNTER — Ambulatory Visit
Admission: RE | Admit: 2013-03-06 | Discharge: 2013-03-06 | Disposition: A | Discharge status: Home / Self Care | Payer: Medicare PPO | Source: Ambulatory Visit | Attending: Radiation Oncology | Admitting: Radiation Oncology

## 2013-03-06 ENCOUNTER — Telehealth: Payer: Self-pay

## 2013-03-06 ENCOUNTER — Encounter: Payer: Self-pay | Admitting: Radiation Oncology

## 2013-03-06 VITALS — BP 112/67 | HR 108 | Temp 98.5°F | Resp 20

## 2013-03-06 VITALS — BP 99/46 | HR 133 | Temp 98.4°F | Resp 22 | Wt 197.3 lb

## 2013-03-06 DIAGNOSIS — R059 Cough, unspecified: Secondary | ICD-10-CM | POA: Insufficient documentation

## 2013-03-06 DIAGNOSIS — C159 Malignant neoplasm of esophagus, unspecified: Secondary | ICD-10-CM

## 2013-03-06 DIAGNOSIS — R05 Cough: Secondary | ICD-10-CM | POA: Insufficient documentation

## 2013-03-06 DIAGNOSIS — C155 Malignant neoplasm of lower third of esophagus: Secondary | ICD-10-CM

## 2013-03-06 DIAGNOSIS — Z794 Long term (current) use of insulin: Secondary | ICD-10-CM | POA: Insufficient documentation

## 2013-03-06 DIAGNOSIS — C160 Malignant neoplasm of cardia: Secondary | ICD-10-CM | POA: Insufficient documentation

## 2013-03-06 DIAGNOSIS — E860 Dehydration: Secondary | ICD-10-CM

## 2013-03-06 LAB — CBC WITH DIFFERENTIAL/PLATELET
Eosinophils Absolute: 0.1 10*3/uL (ref 0.0–0.5)
LYMPH%: 16.2 % (ref 14.0–49.0)
MONO#: 0.7 10*3/uL (ref 0.1–0.9)
NEUT#: 3.5 10*3/uL (ref 1.5–6.5)
Platelets: 190 10*3/uL (ref 140–400)
RBC: 4.27 10*6/uL (ref 4.20–5.82)
WBC: 5.2 10*3/uL (ref 4.0–10.3)

## 2013-03-06 LAB — BASIC METABOLIC PANEL (CC13)
BUN: 18.4 mg/dL (ref 7.0–26.0)
CO2: 23 mEq/L (ref 22–29)
Creatinine: 1 mg/dL (ref 0.7–1.3)
Glucose: 166 mg/dl — ABNORMAL HIGH (ref 70–99)
Potassium: 3.9 mEq/L (ref 3.5–5.1)
Sodium: 137 mEq/L (ref 136–145)

## 2013-03-06 LAB — MAGNESIUM (CC13): Magnesium: 2 mg/dl (ref 1.5–2.5)

## 2013-03-06 MED ORDER — SODIUM CHLORIDE 0.9 % IV SOLN
INTRAVENOUS | Status: AC
  Administered 2013-03-06: 11:00:00 via INTRAVENOUS

## 2013-03-06 MED ORDER — SODIUM CHLORIDE 0.9 % IV SOLN: Freq: Once | INTRAVENOUS | Status: DC

## 2013-03-06 NOTE — Progress Notes (Signed)
Patient presents to the clinic today unaccompanied for evaluation prior to hydration. Patient is alert and oriented to person, place, and time. No distress noted. Steady gait noted. Pleasant affect noted. Patient reports dull pain bottom of esophagus. Patient reports that he has been taking his wife's vicodin to help relieve this pain. However, patient reports when he eats this pain becomes 10 on a scale of 0-10 which doubles him over. Patient reports his esophagus "is fine." Patient reports his last bowel movement on Monday. Patient reports frequent cough. Patient reports spitting up thick frothy sputum. Patient denies nausea and vomiting. Patient reports that he is not eating. Patient scheduled for hydration at 0930. Patient orthostatic. Patient reports a weak urine stream. Also, patient reports a thicken in left groin that has turned black. Reported all findings to Dr. Roselind Messier.

## 2013-03-06 NOTE — Progress Notes (Signed)
Opened encounter in order.  

## 2013-03-06 NOTE — Progress Notes (Signed)
  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 He has had difficulty taking in solids and liquids. He did receive IV fluids late last week which helped the him to feel better. He complains of pain in the gastric region with eating. He denies any cough or breathing problems.                            ALLERGIES:  is allergic to ivp dye.  Meds: Current Outpatient Prescriptions  Medication Sig Dispense Refill  . allopurinol (ZYLOPRIM) 100 MG tablet Take 50 mg by mouth daily.      Marland Kitchen ALPRAZolam (XANAX) 1 MG tablet Take 1 mg by mouth at bedtime as needed. For sleep      . BD ULTRA-FINE PEN NEEDLES 29G X 12.7MM MISC       . glimepiride (AMARYL) 2 MG tablet Take 2 mg by mouth daily before breakfast.      . insulin glargine (LANTUS) 100 UNIT/ML injection Inject 10 Units into the skin at bedtime.      . metFORMIN (GLUCOPHAGE) 500 MG tablet Take 500 mg by mouth 2 (two) times daily with a meal.      . omeprazole (PRILOSEC) 20 MG capsule Take 20 mg by mouth 2 (two) times daily.      . prochlorperazine (COMPAZINE) 10 MG tablet Take 1 tablet (10 mg total) by mouth every 6 (six) hours as needed (nausea).  60 tablet  1  . simvastatin (ZOCOR) 10 MG tablet Take 10 mg by mouth at bedtime.      . sucralfate (CARAFATE) 1 GM/10ML suspension Take 10 mLs (1 g total) by mouth 4 (four) times daily.  420 mL  2  . Wound Cleansers (RADIAPLEX EX) Apply topically.      Marland Kitchen dexamethasone (DECADRON) 4 MG tablet        No current facility-administered medications for this encounter.    Physical Findings: The patient is in no acute distress. Patient is alert and  oriented.  weight is 197 lb 4.8 oz (89.495 kg). His oral temperature is 98.4 F (36.9 C). His blood pressure is 99/46 and his pulse is 133. His respiration is 22 and oxygen saturation is 98%. .  No palpable supraclavicular or axillary adenopathy. The lungs are clear to auscultation. The heart has regular rhythm with increased rate.  Lab Findings: Lab Results  Component Value Date   WBC 5.2 03/06/2013   HGB 13.6 03/06/2013   HCT 38.2* 03/06/2013   MCV 89.4 03/06/2013   PLT 190 03/06/2013      Impression:  The patient is slowly recovering from the effects of radiation and chemotherapy.Marland Kitchen  He will proceed with IV fluid supplementation today as well as tomorrow.    _____________________________________  -----------------------------------  Billie Lade, PhD, MD

## 2013-03-06 NOTE — Telephone Encounter (Signed)
S/w wife that tomorrow's appt is 9am.

## 2013-03-06 NOTE — Patient Instructions (Addendum)
Dehydration, Adult Dehydration means your body does not have as much fluid as it needs. Your kidneys, brain, and heart will not work properly without the right amount of fluids and salt.  HOME CARE  Ask your doctor how to replace body fluid losses (rehydrate).  Drink enough fluids to keep your pee (urine) clear or pale yellow.  Drink small amounts of fluids often if you feel sick to your stomach (nauseous) or throw up (vomit).  Eat like you normally do.  Avoid:  Foods or drinks high in sugar.  Bubbly (carbonated) drinks.  Juice.  Very hot or cold fluids.  Drinks with caffeine.  Fatty, greasy foods.  Alcohol.  Tobacco.  Eating too much.  Gelatin desserts.  Wash your hands to avoid spreading germs (bacteria, viruses).  Only take medicine as told by your doctor.  Keep all doctor visits as told. GET HELP RIGHT AWAY IF:   You cannot drink something without throwing up.  You get worse even with treatment.  Your vomit has blood in it or looks greenish.  Your poop (stool) has blood in it or looks black and tarry.  You have not peed in 6 to 8 hours.  You pee a small amount of very dark pee.  You have a fever.  You pass out (faint).  You have belly (abdominal) pain that gets worse or stays in one spot (localizes).  You have a rash, stiff neck, or bad headache.  You get easily annoyed, sleepy, or are hard to wake up.  You feel weak, dizzy, or very thirsty. MAKE SURE YOU:   Understand these instructions.  Will watch your condition.  Will get help right away if you are not doing well or get worse. Document Released: 09/25/2009 Document Revised: 02/21/2012 Document Reviewed: 07/19/2011 [employer] Patient Information 2013 [employer].  

## 2013-03-07 ENCOUNTER — Ambulatory Visit (HOSPITAL_BASED_OUTPATIENT_CLINIC_OR_DEPARTMENT_OTHER): Payer: Medicare PPO

## 2013-03-07 ENCOUNTER — Other Ambulatory Visit: Payer: Self-pay

## 2013-03-07 VITALS — BP 105/70 | HR 98 | Temp 97.3°F

## 2013-03-07 DIAGNOSIS — C155 Malignant neoplasm of lower third of esophagus: Secondary | ICD-10-CM

## 2013-03-07 DIAGNOSIS — C159 Malignant neoplasm of esophagus, unspecified: Secondary | ICD-10-CM

## 2013-03-07 DIAGNOSIS — E860 Dehydration: Secondary | ICD-10-CM

## 2013-03-07 MED ORDER — SODIUM CHLORIDE 0.9 % IV SOLN
1000.0000 mL | Freq: Once | INTRAVENOUS | Status: AC
  Administered 2013-03-07: 1000 mL via INTRAVENOUS

## 2013-03-07 NOTE — Patient Instructions (Addendum)
Dehydration, Adult Dehydration means your body does not have as much fluid as it needs. Your kidneys, brain, and heart will not work properly without the right amount of fluids and salt.  HOME CARE  Ask your doctor how to replace body fluid losses (rehydrate).  Drink enough fluids to keep your pee (urine) clear or pale yellow.  Drink small amounts of fluids often if you feel sick to your stomach (nauseous) or throw up (vomit).  Eat like you normally do.  Avoid:  Foods or drinks high in sugar.  Bubbly (carbonated) drinks.  Juice.  Very hot or cold fluids.  Drinks with caffeine.  Fatty, greasy foods.  Alcohol.  Tobacco.  Eating too much.  Gelatin desserts.  Wash your hands to avoid spreading germs (bacteria, viruses).  Only take medicine as told by your doctor.  Keep all doctor visits as told. GET HELP RIGHT AWAY IF:   You cannot drink something without throwing up.  You get worse even with treatment.  Your vomit has blood in it or looks greenish.  Your poop (stool) has blood in it or looks black and tarry.  You have not peed in 6 to 8 hours.  You pee a small amount of very dark pee.  You have a fever.  You pass out (faint).  You have belly (abdominal) pain that gets worse or stays in one spot (localizes).  You have a rash, stiff neck, or bad headache.  You get easily annoyed, sleepy, or are hard to wake up.  You feel weak, dizzy, or very thirsty. MAKE SURE YOU:   Understand these instructions.  Will watch your condition.  Will get help right away if you are not doing well or get worse. Document Released: 09/25/2009 Document Revised: 02/21/2012 Document Reviewed: 07/19/2011 [employer] Patient Information 2013 [employer].  

## 2013-03-08 ENCOUNTER — Ambulatory Visit (HOSPITAL_BASED_OUTPATIENT_CLINIC_OR_DEPARTMENT_OTHER): Payer: Medicare PPO

## 2013-03-08 ENCOUNTER — Telehealth: Payer: Self-pay | Admitting: Oncology

## 2013-03-08 ENCOUNTER — Other Ambulatory Visit: Payer: Self-pay | Admitting: Radiation Oncology

## 2013-03-08 VITALS — BP 97/55 | HR 87 | Temp 97.3°F | Resp 20

## 2013-03-08 DIAGNOSIS — E860 Dehydration: Secondary | ICD-10-CM

## 2013-03-08 DIAGNOSIS — C159 Malignant neoplasm of esophagus, unspecified: Secondary | ICD-10-CM

## 2013-03-08 MED ORDER — HYDROCODONE-ACETAMINOPHEN 7.5-325 MG/15ML PO SOLN: 15.0000 mL | Freq: Four times a day (QID) | ORAL | Status: DC | PRN

## 2013-03-08 MED ORDER — SODIUM CHLORIDE 0.9 % IV SOLN
Freq: Once | INTRAVENOUS | Status: AC
  Administered 2013-03-08: 11:00:00 via INTRAVENOUS

## 2013-03-08 NOTE — Telephone Encounter (Signed)
Received a request from Matthew White for Vicodin for the pain he is having with swallowing.  Dr. Roselind Messier sent a prescription for hydrocodone acetaminophen (hycet) 7.5-325 mg/15 ml solution to [facility] on [address].  Called [name] and advised him that the script was sent to [facility] and to contact nursing if he had any issues picking it up.

## 2013-03-08 NOTE — Patient Instructions (Addendum)
Dehydration, Adult Dehydration means your body does not have as much fluid as it needs. Your kidneys, brain, and heart will not work properly without the right amount of fluids and salt.  HOME CARE  Ask your doctor how to replace body fluid losses (rehydrate).  Drink enough fluids to keep your pee (urine) clear or pale yellow.  Drink small amounts of fluids often if you feel sick to your stomach (nauseous) or throw up (vomit).  Eat like you normally do.  Avoid:  Foods or drinks high in sugar.  Bubbly (carbonated) drinks.  Juice.  Very hot or cold fluids.  Drinks with caffeine.  Fatty, greasy foods.  Alcohol.  Tobacco.  Eating too much.  Gelatin desserts.  Wash your hands to avoid spreading germs (bacteria, viruses).  Only take medicine as told by your doctor.  Keep all doctor visits as told. GET HELP RIGHT AWAY IF:   You cannot drink something without throwing up.  You get worse even with treatment.  Your vomit has blood in it or looks greenish.  Your poop (stool) has blood in it or looks black and tarry.  You have not peed in 6 to 8 hours.  You pee a small amount of very dark pee.  You have a fever.  You pass out (faint).  You have belly (abdominal) pain that gets worse or stays in one spot (localizes).  You have a rash, stiff neck, or bad headache.  You get easily annoyed, sleepy, or are hard to wake up.  You feel weak, dizzy, or very thirsty. MAKE SURE YOU:   Understand these instructions.  Will watch your condition.  Will get help right away if you are not doing well or get worse. Document Released: 09/25/2009 Document Revised: 02/21/2012 Document Reviewed: 07/19/2011 [employer] Patient Information 2013 [employer].  

## 2013-03-08 NOTE — Telephone Encounter (Addendum)
Called Matthew White back and told him to come by Nursing to pick up his prescription for Hycet per Dr. Roselind Messier.  [name] came by and picked up the script at 3:15 pm.

## 2013-03-09 ENCOUNTER — Ambulatory Visit (HOSPITAL_BASED_OUTPATIENT_CLINIC_OR_DEPARTMENT_OTHER): Payer: Medicare PPO

## 2013-03-09 VITALS — BP 86/67 | HR 107 | Temp 96.5°F

## 2013-03-09 DIAGNOSIS — E860 Dehydration: Secondary | ICD-10-CM

## 2013-03-09 DIAGNOSIS — C159 Malignant neoplasm of esophagus, unspecified: Secondary | ICD-10-CM

## 2013-03-09 DIAGNOSIS — C155 Malignant neoplasm of lower third of esophagus: Secondary | ICD-10-CM

## 2013-03-09 MED ORDER — SODIUM CHLORIDE 0.9 % IV SOLN
1000.0000 mL | Freq: Once | INTRAVENOUS | Status: AC
  Administered 2013-03-09: 15:00:00 via INTRAVENOUS

## 2013-03-09 NOTE — Patient Instructions (Signed)
Dehydration, Adult Dehydration is when you lose more fluids from the body than you take in. Vital organs like the kidneys, brain, and heart cannot function without a proper amount of fluids and salt. Any loss of fluids from the body can cause dehydration.  CAUSES   Vomiting.  Diarrhea.  Excessive sweating.  Excessive urine output.  Fever. SYMPTOMS  Mild dehydration  Thirst.  Dry lips.  Slightly dry mouth. Moderate dehydration  Very dry mouth.  Sunken eyes.  Skin does not bounce back quickly when lightly pinched and released.  Dark urine and decreased urine production.  Decreased tear production.  Headache. Severe dehydration  Very dry mouth.  Extreme thirst.  Rapid, weak pulse (more than 100 beats per minute at rest).  Cold hands and feet.  Not able to sweat in spite of heat and temperature.  Rapid breathing.  Blue lips.  Confusion and lethargy.  Difficulty being awakened.  Minimal urine production.  No tears. DIAGNOSIS  Your caregiver will diagnose dehydration based on your symptoms and your exam. Blood and urine tests will help confirm the diagnosis. The diagnostic evaluation should also identify the cause of dehydration. TREATMENT  Treatment of mild or moderate dehydration can often be done at home by increasing the amount of fluids that you drink. It is best to drink small amounts of fluid more often. Drinking too much at one time can make vomiting worse. Refer to the home care instructions below. Severe dehydration needs to be treated at the hospital where you will probably be given intravenous (IV) fluids that contain water and electrolytes. HOME CARE INSTRUCTIONS   Ask your caregiver about specific rehydration instructions.  Drink enough fluids to keep your urine clear or pale yellow.  Drink small amounts frequently if you have nausea and vomiting.  Eat as you normally do.  Avoid:  Foods or drinks high in sugar.  Carbonated  drinks.  Juice.  Extremely hot or cold fluids.  Drinks with caffeine.  Fatty, greasy foods.  Alcohol.  Tobacco.  Overeating.  Gelatin desserts.  Wash your hands well to avoid spreading bacteria and viruses.  Only take over-the-counter or prescription medicines for pain, discomfort, or fever as directed by your caregiver.  Ask your caregiver if you should continue all prescribed and over-the-counter medicines.  Keep all follow-up appointments with your caregiver. SEEK MEDICAL CARE IF:  You have abdominal pain and it increases or stays in one area (localizes).  You have a rash, stiff neck, or severe headache.  You are irritable, sleepy, or difficult to awaken.  You are weak, dizzy, or extremely thirsty. SEEK IMMEDIATE MEDICAL CARE IF:   You are unable to keep fluids down or you get worse despite treatment.  You have frequent episodes of vomiting or diarrhea.  You have blood or green matter (bile) in your vomit.  You have blood in your stool or your stool looks black and tarry.  You have not urinated in 6 to 8 hours, or you have only urinated a small amount of very dark urine.  You have a fever.  You faint. MAKE SURE YOU:   Understand these instructions.  Will watch your condition.  Will get help right away if you are not doing well or get worse. Document Released: 11/29/2005 Document Revised: 02/21/2012 Document Reviewed: 07/19/2011 Exit[employer] Patient Information 2013 [employer].  

## 2013-03-12 ENCOUNTER — Ambulatory Visit
Admission: RE | Admit: 2013-03-12 | Discharge: 2013-03-12 | Disposition: A | Discharge status: Home / Self Care | Payer: Medicare PPO | Source: Ambulatory Visit | Attending: Radiation Oncology | Admitting: Radiation Oncology

## 2013-03-12 ENCOUNTER — Encounter: Payer: Self-pay | Admitting: Radiation Oncology

## 2013-03-12 ENCOUNTER — Telehealth: Payer: Self-pay | Admitting: *Deleted

## 2013-03-12 VITALS — BP 80/56 | HR 123 | Temp 97.4°F | Resp 16 | Wt 194.4 lb

## 2013-03-12 DIAGNOSIS — C159 Malignant neoplasm of esophagus, unspecified: Secondary | ICD-10-CM

## 2013-03-12 DIAGNOSIS — C155 Malignant neoplasm of lower third of esophagus: Secondary | ICD-10-CM | POA: Insufficient documentation

## 2013-03-12 DIAGNOSIS — Z79899 Other long term (current) drug therapy: Secondary | ICD-10-CM | POA: Insufficient documentation

## 2013-03-12 LAB — COMPREHENSIVE METABOLIC PANEL (CC13)
Albumin: 2.9 g/dL — ABNORMAL LOW (ref 3.5–5.0)
CO2: 28 mEq/L (ref 22–29)
Chloride: 104 mEq/L (ref 98–107)
Glucose: 135 mg/dl — ABNORMAL HIGH (ref 70–99)
Potassium: 3.8 mEq/L (ref 3.5–5.1)
Sodium: 144 mEq/L (ref 136–145)
Total Protein: 6.9 g/dL (ref 6.4–8.3)

## 2013-03-12 LAB — MAGNESIUM (CC13): Magnesium: 2.1 mg/dl (ref 1.5–2.5)

## 2013-03-12 LAB — CBC WITH DIFFERENTIAL/PLATELET
Eosinophils Absolute: 0.1 10*3/uL (ref 0.0–0.5)
MONO#: 1.1 10*3/uL — ABNORMAL HIGH (ref 0.1–0.9)
NEUT#: 4 10*3/uL (ref 1.5–6.5)
Platelets: 243 10*3/uL (ref 140–400)
RBC: 3.85 10*6/uL — ABNORMAL LOW (ref 4.20–5.82)
RDW: 16.1 % — ABNORMAL HIGH (ref 11.0–14.6)
WBC: 6.8 10*3/uL (ref 4.0–10.3)

## 2013-03-12 MED ORDER — SODIUM CHLORIDE 0.9 % IV SOLN: Freq: Once | INTRAVENOUS | Status: DC

## 2013-03-12 MED ORDER — HYDROCODONE-ACETAMINOPHEN 5-300 MG PO TABS: 5.0000 mg | ORAL_TABLET | Freq: Four times a day (QID) | ORAL | Status: DC | PRN

## 2013-03-12 NOTE — Telephone Encounter (Signed)
Per nurse from radiation I have scheduled appts for tomorrow and Wednesday. She will contact the patient.  JMW

## 2013-03-12 NOTE — Progress Notes (Signed)
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 He continues to have significant problems with esophageal symptoms. He complains of a burning sensation with swallowing and a full feeling in the stomach region.  He did receive IV fluids last week which helped with his dizziness. He continues take Carafate and is using hycet for pain control.                           ALLERGIES:  is allergic to ivp dye.  Meds: Current Outpatient Prescriptions  Medication Sig Dispense Refill  . BD ULTRA-FINE PEN NEEDLES 29G X 12.7MM MISC       . glimepiride (AMARYL) 2 MG tablet Take 2 mg by mouth daily before breakfast.      . HYDROcodone-acetaminophen (HYCET) 7.5-325 mg/15 ml solution Take 15 mLs by mouth 4 (four) times daily as needed for pain.  473 mL  1  . insulin glargine (LANTUS) 100 UNIT/ML injection Inject 10 Units into the skin at bedtime.      . metFORMIN (GLUCOPHAGE) 500 MG tablet Take 500 mg by mouth 2 (two) times daily with a meal.      . omeprazole (PRILOSEC) 20 MG capsule Take 20 mg by mouth 2 (two) times daily.      . sucralfate (CARAFATE) 1 GM/10ML suspension Take 10 mLs (1 g total) by mouth 4 (four) times daily.  420 mL  2  . allopurinol (ZYLOPRIM) 100 MG tablet Take 50 mg by mouth daily.      Marland Kitchen ALPRAZolam (XANAX) 1 MG tablet Take 1 mg by mouth at bedtime as needed. For sleep      . dexamethasone (DECADRON) 4 MG tablet       . Hydrocodone-Acetaminophen 5-300 MG TABS Take 5 mg by mouth every 6 (six) hours as needed (pain).  30 each  0  . prochlorperazine (COMPAZINE) 10 MG tablet Take 1 tablet (10 mg total) by mouth every 6 (six) hours as  needed (nausea).  60 tablet  1  . simvastatin (ZOCOR) 10 MG tablet Take 10 mg by mouth at bedtime.      . Wound Cleansers (RADIAPLEX EX) Apply topically.       No current facility-administered medications for this encounter.    Physical Findings: The patient is in no acute distress. Patient is alert and oriented.  weight is 194 lb 6.4 oz (88.179 kg). His oral temperature is 97.4 F (36.3 C). His blood pressure is 80/56 and his pulse is 123. His respiration is 16 and oxygen saturation is 100%. .  He has dizziness with standing. Examination of oral cavity reveals mucosa to be moist without secondary infection. The lungs are clear. The heart has regular rhythm and rate. The abdomen is soft and nontender with normal bowel sounds.  Lab Findings: Lab Results  Component Value Date   WBC 6.8 03/12/2013   HGB 12.1* 03/12/2013   HCT 35.2* 03/12/2013   MCV 91.3 03/12/2013   PLT 243 03/12/2013      Radiographic Findings: No results found.  Impression:  The patient continues to have significant the  esophageal symptoms interfering with liquid and food intake. He will proceed with the blood work today and scheduled for IV fluid supplementation again tomorrow and Wednesday.   he will also see medical oncology tomorrow. Patient is scheduled for his evaluation at [facility] on Thursday.  _____________________________________ -----------------------------------  Billie Lade, PhD, MD

## 2013-03-12 NOTE — Progress Notes (Signed)
Patient presents to the clinic today accompanied by his wife for follow up with Dr. Roselind Messier. Patient alert and oriented to person, place, and time. No distress noted. Steady gait noted. Pleasant affect noted. Patient reports painful swallowing continues despite taking hycet in the morning and after supper. Patient has lost three pounds since 03/06/2013. Patient is orthostatic. Patient reports drinking on average one boost per day that take two hours to get down. Patient scheduled at [facility] Thursday of this week. Patient reports fullness felt in esophagus following meals has resolved but, is now being felt in stomach. Patient reports coughing up thick sputum to the point of nausea at times. Patient reports takes compazine prn for nausea. Also, patient reports taking carafate before meal without resolve. Patient's wife reports that Thursday he ran a fever in the evening of 101.5. Also, she goes on to explain that most evening he runs a low grade fever. Patient reports chills and night sweats continue on and off day and night that are no worse. Yesterday patient reports that he only drank one boost all day long but, the day before he reports he ate better. Patient reports eating salmon the day before and it was like a "bomb went off in his stomach." Patient reports taking a duculox and having a good bm this morning. Reported all findings to Dr. Roselind Messier.

## 2013-03-13 ENCOUNTER — Other Ambulatory Visit: Payer: Medicare PPO | Admitting: Lab

## 2013-03-13 ENCOUNTER — Ambulatory Visit (HOSPITAL_BASED_OUTPATIENT_CLINIC_OR_DEPARTMENT_OTHER): Payer: Medicare PPO | Admitting: Oncology

## 2013-03-13 ENCOUNTER — Ambulatory Visit (HOSPITAL_BASED_OUTPATIENT_CLINIC_OR_DEPARTMENT_OTHER): Payer: Medicare PPO

## 2013-03-13 ENCOUNTER — Encounter: Payer: Self-pay | Admitting: *Deleted

## 2013-03-13 VITALS — BP 101/69 | HR 118 | Temp 97.0°F | Resp 20 | Ht 73.0 in | Wt 192.6 lb

## 2013-03-13 VITALS — BP 100/67 | HR 106 | Resp 18

## 2013-03-13 DIAGNOSIS — C159 Malignant neoplasm of esophagus, unspecified: Secondary | ICD-10-CM

## 2013-03-13 DIAGNOSIS — C155 Malignant neoplasm of lower third of esophagus: Secondary | ICD-10-CM

## 2013-03-13 DIAGNOSIS — R634 Abnormal weight loss: Secondary | ICD-10-CM

## 2013-03-13 DIAGNOSIS — R5381 Other malaise: Secondary | ICD-10-CM

## 2013-03-13 MED ORDER — HYDROCODONE-ACETAMINOPHEN 5-325 MG PO TABS
1.0000 | ORAL_TABLET | Freq: Once | ORAL | Status: AC
  Administered 2013-03-13: 1 via ORAL

## 2013-03-13 MED ORDER — SODIUM CHLORIDE 0.9 % IV SOLN
INTRAVENOUS | Status: DC
  Administered 2013-03-13: 13:00:00 via INTRAVENOUS

## 2013-03-13 NOTE — Patient Instructions (Addendum)
Dehydration, Adult Dehydration is when you lose more fluids from the body than you take in. Vital organs like the kidneys, brain, and heart cannot function without a proper amount of fluids and salt. Any loss of fluids from the body can cause dehydration.  CAUSES   Vomiting.  Diarrhea.  Excessive sweating.  Excessive urine output.  Fever. SYMPTOMS  Mild dehydration  Thirst.  Dry lips.  Slightly dry mouth. Moderate dehydration  Very dry mouth.  Sunken eyes.  Skin does not bounce back quickly when lightly pinched and released.  Dark urine and decreased urine production.  Decreased tear production.  Headache. Severe dehydration  Very dry mouth.  Extreme thirst.  Rapid, weak pulse (more than 100 beats per minute at rest).  Cold hands and feet.  Not able to sweat in spite of heat and temperature.  Rapid breathing.  Blue lips.  Confusion and lethargy.  Difficulty being awakened.  Minimal urine production.  No tears. DIAGNOSIS  Your caregiver will diagnose dehydration based on your symptoms and your exam. Blood and urine tests will help confirm the diagnosis. The diagnostic evaluation should also identify the cause of dehydration. TREATMENT  Treatment of mild or moderate dehydration can often be done at home by increasing the amount of fluids that you drink. It is best to drink small amounts of fluid more often. Drinking too much at one time can make vomiting worse. Refer to the home care instructions below. Severe dehydration needs to be treated at the hospital where you will probably be given intravenous (IV) fluids that contain water and electrolytes. HOME CARE INSTRUCTIONS   Ask your caregiver about specific rehydration instructions.  Drink enough fluids to keep your urine clear or pale yellow.  Drink small amounts frequently if you have nausea and vomiting.  Eat as you normally do.  Avoid:  Foods or drinks high in sugar.  Carbonated  drinks.  Juice.  Extremely hot or cold fluids.  Drinks with caffeine.  Fatty, greasy foods.  Alcohol.  Tobacco.  Overeating.  Gelatin desserts.  Wash your hands well to avoid spreading bacteria and viruses.  Only take over-the-counter or prescription medicines for pain, discomfort, or fever as directed by your caregiver.  Ask your caregiver if you should continue all prescribed and over-the-counter medicines.  Keep all follow-up appointments with your caregiver. SEEK MEDICAL CARE IF:  You have abdominal pain and it increases or stays in one area (localizes).  You have a rash, stiff neck, or severe headache.  You are irritable, sleepy, or difficult to awaken.  You are weak, dizzy, or extremely thirsty. SEEK IMMEDIATE MEDICAL CARE IF:   You are unable to keep fluids down or you get worse despite treatment.  You have frequent episodes of vomiting or diarrhea.  You have blood or green matter (bile) in your vomit.  You have blood in your stool or your stool looks black and tarry.  You have not urinated in 6 to 8 hours, or you have only urinated a small amount of very dark urine.  You have a fever.  You faint. MAKE SURE YOU:   Understand these instructions.  Will watch your condition.  Will get help right away if you are not doing well or get worse. Document Released: 11/29/2005 Document Revised: 02/21/2012 Document Reviewed: 07/19/2011 Exit[employer] Patient Information 2013 [employer].  

## 2013-03-13 NOTE — Progress Notes (Signed)
   [facility]    OFFICE PROGRESS NOTE   INTERVAL HISTORY:   He completed a fifth week of chemotherapy on 02/22/2013. He completed radiation on 02/27/2013. He developed odynophagia over the last few weeks. He has been evaluated by Dr. Roselind Messier any has received IV fluids. He reports no difficulty swallowing, but he becomes full quickly with associated upper abdominal discomfort. No nausea/vomiting. He feels "weak ". He is drinking 1-2 cans of a nutrition supplement each day.  Last week he had a fever of 101 on one occasion. At other times he has noted a low-grade fever. No dyspnea.  Objective:  Vital signs in last 24 hours:  Blood pressure 101/69, pulse 118, temperature 97 F (36.1 C), temperature source Oral, resp. rate 20, height 6\' 1"  (1.854 m), weight 192 lb 9.6 oz (87.363 kg).    HEENT: No thrush or ulcers Resp: Lungs clear bilaterally Cardio: Regular rate and rhythm, tachycardia GI: Nontender, no hepatomegaly Vascular: No leg edema  Skin: Diminished skin turgor    Lab Results:  Lab Results  Component Value Date   WBC 6.8 03/12/2013   HGB 12.1* 03/12/2013   HCT 35.2* 03/12/2013   MCV 91.3 03/12/2013   PLT 243 03/12/2013   ANC 4.0 BUN 19, creatinine 1.1, potassium 3.8, albumin 2.9    Medications: I have reviewed the patient's current medications.  Assessment/Plan: 1. Adenocarcinoma of the distal esophagus, T2 N1 by EUS/PET staging , he began radiation on 01/24/2013 and completed a first cycle of weekly Taxol/carboplatin on 01/25/2013 , chemotherapy completed 02/22/2013, radiation completed 02/27/2013 2. History of Barrett's esophagus  3. Adult onset diabetes 4. odynophagia/early satiety-he has lost a significant amount of weight over the past few weeks. He appears dehydrated today.   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 He will continue the current antacid regimen. I encouraged him to try the hydrocodone prior to  eating. He will receive intravenous fluids for the next several days. He will increase his diet as tolerated. I am hopeful that his symptoms will resolve over the next one to 2 weeks. He will schedule a followup appointment in surgery at [facility]. We will see him for followup visit on 03/19/2013.   Thornton Papas, MD  03/13/2013  2:15 PM

## 2013-03-14 ENCOUNTER — Ambulatory Visit (HOSPITAL_BASED_OUTPATIENT_CLINIC_OR_DEPARTMENT_OTHER): Payer: Medicare PPO

## 2013-03-14 VITALS — BP 99/71 | HR 117 | Temp 97.1°F | Resp 17

## 2013-03-14 DIAGNOSIS — R1310 Dysphagia, unspecified: Secondary | ICD-10-CM

## 2013-03-14 DIAGNOSIS — C159 Malignant neoplasm of esophagus, unspecified: Secondary | ICD-10-CM

## 2013-03-14 MED ORDER — SODIUM CHLORIDE 0.9 % IV SOLN
INTRAVENOUS | Status: DC
  Administered 2013-03-14: 13:00:00 via INTRAVENOUS

## 2013-03-14 NOTE — Patient Instructions (Addendum)
Dehydration, Adult Dehydration is when you lose more fluids from the body than you take in. Vital organs like the kidneys, brain, and heart cannot function without a proper amount of fluids and salt. Any loss of fluids from the body can cause dehydration.  CAUSES   Vomiting.  Diarrhea.  Excessive sweating.  Excessive urine output.  Fever. SYMPTOMS  Mild dehydration  Thirst.  Dry lips.  Slightly dry mouth. Moderate dehydration  Very dry mouth.  Sunken eyes.  Skin does not bounce back quickly when lightly pinched and released.  Dark urine and decreased urine production.  Decreased tear production.  Headache. Severe dehydration  Very dry mouth.  Extreme thirst.  Rapid, weak pulse (more than 100 beats per minute at rest).  Cold hands and feet.  Not able to sweat in spite of heat and temperature.  Rapid breathing.  Blue lips.  Confusion and lethargy.  Difficulty being awakened.  Minimal urine production.  No tears. DIAGNOSIS  Your caregiver will diagnose dehydration based on your symptoms and your exam. Blood and urine tests will help confirm the diagnosis. The diagnostic evaluation should also identify the cause of dehydration. TREATMENT  Treatment of mild or moderate dehydration can often be done at home by increasing the amount of fluids that you drink. It is best to drink small amounts of fluid more often. Drinking too much at one time can make vomiting worse. Refer to the home care instructions below. Severe dehydration needs to be treated at the hospital where you will probably be given intravenous (IV) fluids that contain water and electrolytes. HOME CARE INSTRUCTIONS   Ask your caregiver about specific rehydration instructions.  Drink enough fluids to keep your urine clear or pale yellow.  Drink small amounts frequently if you have nausea and vomiting.  Eat as you normally do.  Avoid:  Foods or drinks high in sugar.  Carbonated  drinks.  Juice.  Extremely hot or cold fluids.  Drinks with caffeine.  Fatty, greasy foods.  Alcohol.  Tobacco.  Overeating.  Gelatin desserts.  Wash your hands well to avoid spreading bacteria and viruses.  Only take over-the-counter or prescription medicines for pain, discomfort, or fever as directed by your caregiver.  Ask your caregiver if you should continue all prescribed and over-the-counter medicines.  Keep all follow-up appointments with your caregiver. SEEK MEDICAL CARE IF:  You have abdominal pain and it increases or stays in one area (localizes).  You have a rash, stiff neck, or severe headache.  You are irritable, sleepy, or difficult to awaken.  You are weak, dizzy, or extremely thirsty. SEEK IMMEDIATE MEDICAL CARE IF:   You are unable to keep fluids down or you get worse despite treatment.  You have frequent episodes of vomiting or diarrhea.  You have blood or green matter (bile) in your vomit.  You have blood in your stool or your stool looks black and tarry.  You have not urinated in 6 to 8 hours, or you have only urinated a small amount of very dark urine.  You have a fever.  You faint. MAKE SURE YOU:   Understand these instructions.  Will watch your condition.  Will get help right away if you are not doing well or get worse. Document Released: 11/29/2005 Document Revised: 02/21/2012 Document Reviewed: 07/19/2011 Exit[employer] Patient Information 2013 [employer].  

## 2013-03-15 ENCOUNTER — Telehealth: Payer: Self-pay | Admitting: Oncology

## 2013-03-15 NOTE — Telephone Encounter (Signed)
S/W THE PT AND HE IS AWARE OF HIS  IV FLUID APPT THIS Friday@2 :45PM AND TO PICK UP THE SCHEDULES AT THAT TIME

## 2013-03-16 ENCOUNTER — Telehealth: Payer: Self-pay | Admitting: Radiation Oncology

## 2013-03-16 ENCOUNTER — Ambulatory Visit (HOSPITAL_BASED_OUTPATIENT_CLINIC_OR_DEPARTMENT_OTHER): Payer: Medicare PPO

## 2013-03-16 DIAGNOSIS — C159 Malignant neoplasm of esophagus, unspecified: Secondary | ICD-10-CM

## 2013-03-16 DIAGNOSIS — R1310 Dysphagia, unspecified: Secondary | ICD-10-CM

## 2013-03-16 MED ORDER — SODIUM CHLORIDE 0.9 % IV SOLN
INTRAVENOUS | Status: DC
  Administered 2013-03-16: 15:00:00 via INTRAVENOUS

## 2013-03-16 NOTE — Telephone Encounter (Signed)
Phoned to check patient status. Patient unavailable. Spoke with patient's wife, Matthew White. She reports that the patient is essentially "starving." She goes on to explain that he refused to eat because "once food hits his stomach he doubles over in pain." She reports that even though he has been hydrated several time with more scheduled for the near future he remain weak. Patient's wife reports he is frustrated and beginning to become depressed. She explains that the pre op appointment scheduled at [facility] for 03/15/13 they cancelled because he is so weak and rescheduled for 04/17/13. She reports the vicodin recommended before meals isn't working to "even touch the pain,." She does reports that the esophagitis has resolved. Routed this message to Dr. Roselind Messier. Encouraged patient's wife to keep appointment with Dr. Truett Perna for Monday.

## 2013-03-16 NOTE — Patient Instructions (Signed)
Dehydration, Adult Dehydration is when you lose more fluids from the body than you take in. Vital organs like the kidneys, brain, and heart cannot function without a proper amount of fluids and salt. Any loss of fluids from the body can cause dehydration.  CAUSES   Vomiting.  Diarrhea.  Excessive sweating.  Excessive urine output.  Fever. SYMPTOMS  Mild dehydration  Thirst.  Dry lips.  Slightly dry mouth. Moderate dehydration  Very dry mouth.  Sunken eyes.  Skin does not bounce back quickly when lightly pinched and released.  Dark urine and decreased urine production.  Decreased tear production.  Headache. Severe dehydration  Very dry mouth.  Extreme thirst.  Rapid, weak pulse (more than 100 beats per minute at rest).  Cold hands and feet.  Not able to sweat in spite of heat and temperature.  Rapid breathing.  Blue lips.  Confusion and lethargy.  Difficulty being awakened.  Minimal urine production.  No tears. DIAGNOSIS  Your caregiver will diagnose dehydration based on your symptoms and your exam. Blood and urine tests will help confirm the diagnosis. The diagnostic evaluation should also identify the cause of dehydration. TREATMENT  Treatment of mild or moderate dehydration can often be done at home by increasing the amount of fluids that you drink. It is best to drink small amounts of fluid more often. Drinking too much at one time can make vomiting worse. Refer to the home care instructions below. Severe dehydration needs to be treated at the hospital where you will probably be given intravenous (IV) fluids that contain water and electrolytes. HOME CARE INSTRUCTIONS   Ask your caregiver about specific rehydration instructions.  Drink enough fluids to keep your urine clear or pale yellow.  Drink small amounts frequently if you have nausea and vomiting.  Eat as you normally do.  Avoid:  Foods or drinks high in sugar.  Carbonated  drinks.  Juice.  Extremely hot or cold fluids.  Drinks with caffeine.  Fatty, greasy foods.  Alcohol.  Tobacco.  Overeating.  Gelatin desserts.  Wash your hands well to avoid spreading bacteria and viruses.  Only take over-the-counter or prescription medicines for pain, discomfort, or fever as directed by your caregiver.  Ask your caregiver if you should continue all prescribed and over-the-counter medicines.  Keep all follow-up appointments with your caregiver. SEEK MEDICAL CARE IF:  You have abdominal pain and it increases or stays in one area (localizes).  You have a rash, stiff neck, or severe headache.  You are irritable, sleepy, or difficult to awaken.  You are weak, dizzy, or extremely thirsty. SEEK IMMEDIATE MEDICAL CARE IF:   You are unable to keep fluids down or you get worse despite treatment.  You have frequent episodes of vomiting or diarrhea.  You have blood or green matter (bile) in your vomit.  You have blood in your stool or your stool looks black and tarry.  You have not urinated in 6 to 8 hours, or you have only urinated a small amount of very dark urine.  You have a fever.  You faint. MAKE SURE YOU:   Understand these instructions.  Will watch your condition.  Will get help right away if you are not doing well or get worse. Document Released: 11/29/2005 Document Revised: 02/21/2012 Document Reviewed: 07/19/2011 Exit[employer] Patient Information 2013 [employer].  

## 2013-03-17 ENCOUNTER — Ambulatory Visit (HOSPITAL_BASED_OUTPATIENT_CLINIC_OR_DEPARTMENT_OTHER): Payer: Medicare PPO

## 2013-03-17 VITALS — BP 92/57 | HR 118 | Temp 98.0°F | Resp 18

## 2013-03-17 DIAGNOSIS — C159 Malignant neoplasm of esophagus, unspecified: Secondary | ICD-10-CM

## 2013-03-17 DIAGNOSIS — C155 Malignant neoplasm of lower third of esophagus: Secondary | ICD-10-CM

## 2013-03-17 DIAGNOSIS — E860 Dehydration: Secondary | ICD-10-CM

## 2013-03-17 MED ORDER — SODIUM CHLORIDE 0.9 % IV SOLN
INTRAVENOUS | Status: AC
  Administered 2013-03-17: 08:00:00 via INTRAVENOUS

## 2013-03-19 ENCOUNTER — Ambulatory Visit (HOSPITAL_BASED_OUTPATIENT_CLINIC_OR_DEPARTMENT_OTHER): Payer: Medicare PPO | Admitting: Nurse Practitioner

## 2013-03-19 VITALS — BP 93/63 | HR 108 | Temp 97.2°F | Resp 18 | Ht 73.0 in | Wt 194.0 lb

## 2013-03-19 DIAGNOSIS — C159 Malignant neoplasm of esophagus, unspecified: Secondary | ICD-10-CM

## 2013-03-19 DIAGNOSIS — B370 Candidal stomatitis: Secondary | ICD-10-CM

## 2013-03-19 DIAGNOSIS — E119 Type 2 diabetes mellitus without complications: Secondary | ICD-10-CM

## 2013-03-19 DIAGNOSIS — C155 Malignant neoplasm of lower third of esophagus: Secondary | ICD-10-CM

## 2013-03-19 MED ORDER — FLUCONAZOLE 100 MG PO TABS: 100.0000 mg | ORAL_TABLET | Freq: Every day | ORAL | Status: DC

## 2013-03-19 NOTE — Progress Notes (Signed)
OFFICE PROGRESS NOTE  Interval history:  Mr. Fadden returns as scheduled. He is some better. He notes improved tolerance of liquids. He is taking approximately 4 nutritional supplements a day. After drinking a nutritional supplement he notes abdominal fullness and a total abdominal discomfort. He has mild occasional nausea. Bowels moving though he does note some constipation. Pain overall is better. He is taking less Vicodin.   Objective: Blood pressure 93/63, pulse 108, temperature 97.2 F (36.2 C), temperature source Oral, resp. rate 18, height 6\' 1"  (1.854 m), weight 194 lb (87.998 kg).  White coating over tongue. Lungs clear. Regular cardiac rhythm. Tachycardic. Abdomen is soft and nontender. No organomegaly. Extremities are without edema. Skin turgor is normal.  Lab Results: Lab Results  Component Value Date   WBC 6.8 03/12/2013   HGB 12.1* 03/12/2013   HCT 35.2* 03/12/2013   MCV 91.3 03/12/2013   PLT 243 03/12/2013    Chemistry:    Chemistry      Component Value Date/Time   NA 144 03/12/2013 1507   K 3.8 03/12/2013 1507   CL 104 03/12/2013 1507   CO2 28 03/12/2013 1507   BUN 19.0 03/12/2013 1507   CREATININE 1.1 03/12/2013 1507      Component Value Date/Time   CALCIUM 9.6 03/12/2013 1507   ALKPHOS 67 03/12/2013 1507   AST 22 03/12/2013 1507   ALT 19 03/12/2013 1507   BILITOT 0.57 03/12/2013 1507       Studies/Results: No results found.  Medications: I have reviewed the patient's current medications.  Assessment/Plan:  1. 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  2. History of Barrett's esophagus.  3. Adult onset diabetes.  4. Odynophagia/early satiety. The odynophagia is better. 5. Oral candidiasis.  Disposition-overall Mr. Blackshire appears improved. He will continue to push  fluids orally. He is scheduled to receive IV fluids in the office tomorrow. He will complete a five-day course of Diflucan for oral candidiasis. He has rescheduled his appointment with surgery at [facility] until 04/17/2013. He will return for a followup visit here on 04/03/2013. He will contact the office the interim with any problems. We specifically discussed signs and symptoms of dehydration.  Plan reviewed with Dr. Truett Perna.   Lonna Cobb ANP/GNP-BC

## 2013-03-20 ENCOUNTER — Telehealth: Payer: Self-pay | Admitting: *Deleted

## 2013-03-20 ENCOUNTER — Ambulatory Visit (HOSPITAL_BASED_OUTPATIENT_CLINIC_OR_DEPARTMENT_OTHER): Payer: Medicare PPO

## 2013-03-20 VITALS — BP 114/73 | HR 110 | Temp 98.3°F | Resp 22

## 2013-03-20 DIAGNOSIS — E860 Dehydration: Secondary | ICD-10-CM

## 2013-03-20 DIAGNOSIS — C155 Malignant neoplasm of lower third of esophagus: Secondary | ICD-10-CM

## 2013-03-20 DIAGNOSIS — C159 Malignant neoplasm of esophagus, unspecified: Secondary | ICD-10-CM

## 2013-03-20 MED ORDER — SODIUM CHLORIDE 0.9 % IV SOLN
Freq: Once | INTRAVENOUS | Status: AC
  Administered 2013-03-20: 15:00:00 via INTRAVENOUS

## 2013-03-20 NOTE — Telephone Encounter (Signed)
sw pt wife she stated that they are aware of 04/03/13 apot. Dr. Truett Perna had already informed them on 03/19/13.

## 2013-03-20 NOTE — Telephone Encounter (Signed)
CALLED PATIENT TO INFORM OF FU VISIT FOR 03-26-13 AT 3:30 PM, SPOKE WITH PATIENT'S WIFE AND SHE IS AWARE OF THIS APPT.

## 2013-03-20 NOTE — Patient Instructions (Addendum)
Dehydration, Adult Dehydration means your body does not have as much fluid as it needs. Your kidneys, brain, and heart will not work properly without the right amount of fluids and salt.  HOME CARE  Ask your doctor how to replace body fluid losses (rehydrate).  Drink enough fluids to keep your pee (urine) clear or pale yellow.  Drink small amounts of fluids often if you feel sick to your stomach (nauseous) or throw up (vomit).  Eat like you normally do.  Avoid:  Foods or drinks high in sugar.  Bubbly (carbonated) drinks.  Juice.  Very hot or cold fluids.  Drinks with caffeine.  Fatty, greasy foods.  Alcohol.  Tobacco.  Eating too much.  Gelatin desserts.  Wash your hands to avoid spreading germs (bacteria, viruses).  Only take medicine as told by your doctor.  Keep all doctor visits as told. GET HELP RIGHT AWAY IF:   You cannot drink something without throwing up.  You get worse even with treatment.  Your vomit has blood in it or looks greenish.  Your poop (stool) has blood in it or looks black and tarry.  You have not peed in 6 to 8 hours.  You pee a small amount of very dark pee.  You have a fever.  You pass out (faint).  You have belly (abdominal) pain that gets worse or stays in one spot (localizes).  You have a rash, stiff neck, or bad headache.  You get easily annoyed, sleepy, or are hard to wake up.  You feel weak, dizzy, or very thirsty. MAKE SURE YOU:   Understand these instructions.  Will watch your condition.  Will get help right away if you are not doing well or get worse. Document Released: 09/25/2009 Document Revised: 02/21/2012 Document Reviewed: 07/19/2011 [employer] Patient Information 2013 [employer].  

## 2013-03-22 ENCOUNTER — Other Ambulatory Visit: Payer: Self-pay | Admitting: Radiation Oncology

## 2013-03-22 MED ORDER — FENTANYL 25 MCG/HR TD PT72: 1.0000 | MEDICATED_PATCH | TRANSDERMAL | Status: DC

## 2013-03-26 ENCOUNTER — Ambulatory Visit
Admission: RE | Admit: 2013-03-26 | Discharge: 2013-03-26 | Disposition: A | Discharge status: Home / Self Care | Payer: Medicare PPO | Source: Ambulatory Visit | Attending: Radiation Oncology | Admitting: Radiation Oncology

## 2013-03-26 ENCOUNTER — Encounter: Payer: Self-pay | Admitting: Radiation Oncology

## 2013-03-26 VITALS — BP 76/46 | HR 121 | Temp 97.7°F | Resp 16 | Wt 187.5 lb

## 2013-03-26 DIAGNOSIS — C159 Malignant neoplasm of esophagus, unspecified: Secondary | ICD-10-CM

## 2013-03-26 NOTE — Progress Notes (Signed)
Patient presents to the clinic today accompanied by his wife for follow up with Dr. Roselind Messier. Patient is alert and oriented to person, place, and time. No distress noted. Steady gait noted. Pleasant affect noted. Patient denies pain at this time. However, patient reports that after he eats the severe abdominal fullness continues for which he wears a fentanyl patch. Patient reports night sweats and occasional cold chills. Patient reports fatigue continues. Patient has lost 6.2 lb since 03/19/2013. Patient reports drinking five cans of boost plus per day. Patient denies eating any food by mouth. Patient wishes he could drink more fluid and stay hydrated. Patient orthostatic today. Patient to follow up with Dr. Truett Perna on 4/22. Patient denies nausea, vomiting, or headache. Patient denies taking hycet or carafate. Last fluid hydration done 03/19/2013. Patient reports passing flatus slightly relieves stomach fullness after meals. Patient wearing fentanyl patch 25 mcg x 3 days now. Patient reports dizziness only with dehydration. Patient continues to spit up thick sputum. Patient reports dry mouth continues. Surgery still not scheduled. Reported all findings to Dr. Roselind Messier.

## 2013-03-26 NOTE — Progress Notes (Signed)
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  He was seen by Dr. Truett Perna approximately a week ago when doing a little better, however the patient called late last week he was complaining of more pain in the epigastric region weekly with consuming foods. Patient was placed on a Duragesic patch which is been marginally helpful.  On a good note the patient has been able to consume 5 cans of nutritional supplements over the past 2-3 days.  He continues to complain of intense pain once food hits his stomach region. He does have some cramping. He also has a lot of belching as well as flatulence. he does have dizziness with standing.  His preop appointment at [facility] is been moved out to early May.                            ALLERGIES:  is allergic to ivp dye.  Meds: Current Outpatient Prescriptions  Medication Sig Dispense Refill  . allopurinol (ZYLOPRIM) 100 MG tablet Take 50 mg by mouth daily.      Marland Kitchen ALPRAZolam (XANAX) 1 MG tablet Take 1 mg by mouth at bedtime as needed. For sleep      . BD ULTRA-FINE PEN NEEDLES 29G X 12.7MM MISC       . glimepiride (AMARYL) 2 MG tablet Take 2 mg by mouth daily before breakfast.      . insulin glargine (LANTUS) 100 UNIT/ML injection Inject 10 Units into the skin at bedtime.      . metFORMIN (GLUCOPHAGE) 500 MG tablet Take 500 mg by mouth 2 (two) times daily with a meal. Taking one tab daily      . fluconazole (DIFLUCAN) 100 MG tablet Take 1 tablet (100 mg total) by mouth daily.  5 tablet  0  . HYDROcodone-acetaminophen (HYCET) 7.5-325 mg/15 ml solution Take 15 mLs by mouth 4 (four) times daily as needed for pain.  473 mL  1    . Hydrocodone-Acetaminophen 5-300 MG TABS Take 5 mg by mouth every 6 (six) hours as needed (pain).  30 each  0  . omeprazole (PRILOSEC) 20 MG capsule Take 20 mg by mouth 2 (two) times daily.      . prochlorperazine (COMPAZINE) 10 MG tablet Take 1 tablet (10 mg total) by mouth every 6 (six) hours as needed (nausea).  60 tablet  1  . sucralfate (CARAFATE) 1 GM/10ML suspension Take 10 mLs (1 g total) by mouth 4 (four) times daily.  420 mL  2  . Wound Cleansers (RADIAPLEX EX) Apply topically.       No current facility-administered medications for this encounter.    Physical Findings: The patient is in no acute distress. Patient is alert and oriented.  weight is 187 lb 8 oz (85.049 kg). His oral temperature is 97.7 F (36.5 C). His blood pressure is 76/46 and his pulse is 121. His respiration is 16 and oxygen saturation is 97%. .  The patient shows orthostatic pressure changes.  The oral cavity is somewhat dry without secondary infection.  The lungs are clear. The heart has a regular rhythm  with increased rate. The abdomen is soft and nontender with increased bowel sounds.  Lab Findings: Lab Results  Component Value Date   WBC 6.8 03/12/2013   HGB 12.1* 03/12/2013   HCT 35.2* 03/12/2013   MCV 91.3 03/12/2013   PLT 243 03/12/2013       Impression:  The patient continues to have pain as above with food ingestion. Upon further questioning the patient does seem to have a lot of bloating.  He will try simethicone prior to food ingestion to see if this will help with these issues.  Plan:  Patient will also be set up for IV fluids for the next 3 days.  In light of his continued problems I will request GI referral for endoscopy with Dr. Randa Evens.  _____________________________________  -----------------------------------  Billie Lade, PhD, MD

## 2013-03-27 ENCOUNTER — Ambulatory Visit (HOSPITAL_BASED_OUTPATIENT_CLINIC_OR_DEPARTMENT_OTHER): Payer: Medicare PPO

## 2013-03-27 ENCOUNTER — Other Ambulatory Visit: Payer: Self-pay | Admitting: Radiation Oncology

## 2013-03-27 DIAGNOSIS — E860 Dehydration: Secondary | ICD-10-CM

## 2013-03-27 DIAGNOSIS — C155 Malignant neoplasm of lower third of esophagus: Secondary | ICD-10-CM

## 2013-03-27 DIAGNOSIS — R110 Nausea: Secondary | ICD-10-CM

## 2013-03-27 MED ORDER — SODIUM CHLORIDE 0.9 % IV SOLN
Freq: Once | INTRAVENOUS | Status: DC
  Administered 2013-03-27: 14:00:00 via INTRAVENOUS
  Filled 2013-03-27: qty 1000

## 2013-03-27 NOTE — Patient Instructions (Addendum)
Dehydration, Adult Dehydration means your body does not have as much fluid as it needs. Your kidneys, brain, and heart will not work properly without the right amount of fluids and salt.  HOME CARE  Ask your doctor how to replace body fluid losses (rehydrate).  Drink enough fluids to keep your pee (urine) clear or pale yellow.  Drink small amounts of fluids often if you feel sick to your stomach (nauseous) or throw up (vomit).  Eat like you normally do.  Avoid:  Foods or drinks high in sugar.  Bubbly (carbonated) drinks.  Juice.  Very hot or cold fluids.  Drinks with caffeine.  Fatty, greasy foods.  Alcohol.  Tobacco.  Eating too much.  Gelatin desserts.  Wash your hands to avoid spreading germs (bacteria, viruses).  Only take medicine as told by your doctor.  Keep all doctor visits as told. GET HELP RIGHT AWAY IF:   You cannot drink something without throwing up.  You get worse even with treatment.  Your vomit has blood in it or looks greenish.  Your poop (stool) has blood in it or looks black and tarry.  You have not peed in 6 to 8 hours.  You pee a small amount of very dark pee.  You have a fever.  You pass out (faint).  You have belly (abdominal) pain that gets worse or stays in one spot (localizes).  You have a rash, stiff neck, or bad headache.  You get easily annoyed, sleepy, or are hard to wake up.  You feel weak, dizzy, or very thirsty. MAKE SURE YOU:   Understand these instructions.  Will watch your condition.  Will get help right away if you are not doing well or get worse. Document Released: 09/25/2009 Document Revised: 02/21/2012 Document Reviewed: 07/19/2011 [employer] Patient Information 2013 [employer].  

## 2013-03-28 ENCOUNTER — Ambulatory Visit (HOSPITAL_BASED_OUTPATIENT_CLINIC_OR_DEPARTMENT_OTHER): Payer: Medicare PPO

## 2013-03-28 ENCOUNTER — Telehealth: Payer: Self-pay | Admitting: Radiation Oncology

## 2013-03-28 DIAGNOSIS — C159 Malignant neoplasm of esophagus, unspecified: Secondary | ICD-10-CM

## 2013-03-28 DIAGNOSIS — C155 Malignant neoplasm of lower third of esophagus: Secondary | ICD-10-CM

## 2013-03-28 DIAGNOSIS — E860 Dehydration: Secondary | ICD-10-CM

## 2013-03-28 MED ORDER — SODIUM CHLORIDE 0.9 % IV SOLN
Freq: Once | INTRAVENOUS | Status: AC
  Administered 2013-03-28: 15:00:00 via INTRAVENOUS

## 2013-03-28 NOTE — Telephone Encounter (Signed)
Per Doreatha Martin, RN, faxed records to [facility], [phone].  Received confirmation.

## 2013-03-28 NOTE — Patient Instructions (Addendum)
Dehydration, Adult Dehydration is when you lose more fluids from the body than you take in. Vital organs like the kidneys, brain, and heart cannot function without a proper amount of fluids and salt. Any loss of fluids from the body can cause dehydration.  CAUSES   Vomiting.  Diarrhea.  Excessive sweating.  Excessive urine output.  Fever. SYMPTOMS  Mild dehydration  Thirst.  Dry lips.  Slightly dry mouth. Moderate dehydration  Very dry mouth.  Sunken eyes.  Skin does not bounce back quickly when lightly pinched and released.  Dark urine and decreased urine production.  Decreased tear production.  Headache. Severe dehydration  Very dry mouth.  Extreme thirst.  Rapid, weak pulse (more than 100 beats per minute at rest).  Cold hands and feet.  Not able to sweat in spite of heat and temperature.  Rapid breathing.  Blue lips.  Confusion and lethargy.  Difficulty being awakened.  Minimal urine production.  No tears. DIAGNOSIS  Your caregiver will diagnose dehydration based on your symptoms and your exam. Blood and urine tests will help confirm the diagnosis. The diagnostic evaluation should also identify the cause of dehydration. TREATMENT  Treatment of mild or moderate dehydration can often be done at home by increasing the amount of fluids that you drink. It is best to drink small amounts of fluid more often. Drinking too much at one time can make vomiting worse. Refer to the home care instructions below. Severe dehydration needs to be treated at the hospital where you will probably be given intravenous (IV) fluids that contain water and electrolytes. HOME CARE INSTRUCTIONS   Ask your caregiver about specific rehydration instructions.  Drink enough fluids to keep your urine clear or pale yellow.  Drink small amounts frequently if you have nausea and vomiting.  Eat as you normally do.  Avoid:  Foods or drinks high in sugar.  Carbonated  drinks.  Juice.  Extremely hot or cold fluids.  Drinks with caffeine.  Fatty, greasy foods.  Alcohol.  Tobacco.  Overeating.  Gelatin desserts.  Wash your hands well to avoid spreading bacteria and viruses.  Only take over-the-counter or prescription medicines for pain, discomfort, or fever as directed by your caregiver.  Ask your caregiver if you should continue all prescribed and over-the-counter medicines.  Keep all follow-up appointments with your caregiver. SEEK MEDICAL CARE IF:  You have abdominal pain and it increases or stays in one area (localizes).  You have a rash, stiff neck, or severe headache.  You are irritable, sleepy, or difficult to awaken.  You are weak, dizzy, or extremely thirsty. SEEK IMMEDIATE MEDICAL CARE IF:   You are unable to keep fluids down or you get worse despite treatment.  You have frequent episodes of vomiting or diarrhea.  You have blood or green matter (bile) in your vomit.  You have blood in your stool or your stool looks black and tarry.  You have not urinated in 6 to 8 hours, or you have only urinated a small amount of very dark urine.  You have a fever.  You faint. MAKE SURE YOU:   Understand these instructions.  Will watch your condition.  Will get help right away if you are not doing well or get worse. Document Released: 11/29/2005 Document Revised: 02/21/2012 Document Reviewed: 07/19/2011 Exit[employer] Patient Information 2013 [employer].  

## 2013-03-28 NOTE — Telephone Encounter (Signed)
Phoned patient's wife, Kriste Basque, informing her of her husband's appointment with Dr. Carman Ching on 04/03/13 at 3:45 pm. She verbalized understanding. Kriste Basque reports that the hydration helps "Ed feel so much better." Also, she reports he is drinking 5 cans of boost per day. Routed message to Dr. Roselind Messier.

## 2013-03-28 NOTE — Telephone Encounter (Signed)
Per Dr. Trina Ao order phoned Sharyl Nimrod at [facility] referring patient back to Dr. Carman Ching to evaluate severe abdominal pain following meals and possible scope. Marissa Nestle to fax all records to [phone].

## 2013-03-29 ENCOUNTER — Ambulatory Visit (HOSPITAL_BASED_OUTPATIENT_CLINIC_OR_DEPARTMENT_OTHER): Payer: Medicare PPO

## 2013-03-29 VITALS — BP 97/65 | HR 91 | Temp 99.3°F | Resp 18

## 2013-03-29 DIAGNOSIS — E860 Dehydration: Secondary | ICD-10-CM

## 2013-03-29 DIAGNOSIS — C159 Malignant neoplasm of esophagus, unspecified: Secondary | ICD-10-CM

## 2013-03-29 DIAGNOSIS — C155 Malignant neoplasm of lower third of esophagus: Secondary | ICD-10-CM

## 2013-03-29 MED ORDER — SODIUM CHLORIDE 0.9 % IV SOLN
Freq: Once | INTRAVENOUS | Status: AC
  Administered 2013-03-29: 14:00:00 via INTRAVENOUS

## 2013-03-29 NOTE — Patient Instructions (Signed)
Dehydration, Adult Dehydration is when you lose more fluids from the body than you take in. Vital organs like the kidneys, brain, and heart cannot function without a proper amount of fluids and salt. Any loss of fluids from the body can cause dehydration.  CAUSES   Vomiting.  Diarrhea.  Excessive sweating.  Excessive urine output.  Fever. SYMPTOMS  Mild dehydration  Thirst.  Dry lips.  Slightly dry mouth. Moderate dehydration  Very dry mouth.  Sunken eyes.  Skin does not bounce back quickly when lightly pinched and released.  Dark urine and decreased urine production.  Decreased tear production.  Headache. Severe dehydration  Very dry mouth.  Extreme thirst.  Rapid, weak pulse (more than 100 beats per minute at rest).  Cold hands and feet.  Not able to sweat in spite of heat and temperature.  Rapid breathing.  Blue lips.  Confusion and lethargy.  Difficulty being awakened.  Minimal urine production.  No tears. DIAGNOSIS  Your caregiver will diagnose dehydration based on your symptoms and your exam. Blood and urine tests will help confirm the diagnosis. The diagnostic evaluation should also identify the cause of dehydration. TREATMENT  Treatment of mild or moderate dehydration can often be done at home by increasing the amount of fluids that you drink. It is best to drink small amounts of fluid more often. Drinking too much at one time can make vomiting worse. Refer to the home care instructions below. Severe dehydration needs to be treated at the hospital where you will probably be given intravenous (IV) fluids that contain water and electrolytes. HOME CARE INSTRUCTIONS   Ask your caregiver about specific rehydration instructions.  Drink enough fluids to keep your urine clear or pale yellow.  Drink small amounts frequently if you have nausea and vomiting.  Eat as you normally do.  Avoid:  Foods or drinks high in sugar.  Carbonated  drinks.  Juice.  Extremely hot or cold fluids.  Drinks with caffeine.  Fatty, greasy foods.  Alcohol.  Tobacco.  Overeating.  Gelatin desserts.  Wash your hands well to avoid spreading bacteria and viruses.  Only take over-the-counter or prescription medicines for pain, discomfort, or fever as directed by your caregiver.  Ask your caregiver if you should continue all prescribed and over-the-counter medicines.  Keep all follow-up appointments with your caregiver. SEEK MEDICAL CARE IF:  You have abdominal pain and it increases or stays in one area (localizes).  You have a rash, stiff neck, or severe headache.  You are irritable, sleepy, or difficult to awaken.  You are weak, dizzy, or extremely thirsty. SEEK IMMEDIATE MEDICAL CARE IF:   You are unable to keep fluids down or you get worse despite treatment.  You have frequent episodes of vomiting or diarrhea.  You have blood or green matter (bile) in your vomit.  You have blood in your stool or your stool looks black and tarry.  You have not urinated in 6 to 8 hours, or you have only urinated a small amount of very dark urine.  You have a fever.  You faint. MAKE SURE YOU:   Understand these instructions.  Will watch your condition.  Will get help right away if you are not doing well or get worse. Document Released: 11/29/2005 Document Revised: 02/21/2012 Document Reviewed: 07/19/2011 Exit[employer] Patient Information 2013 [employer].  

## 2013-03-30 ENCOUNTER — Telehealth: Payer: Self-pay | Admitting: Dietician

## 2013-03-30 NOTE — Telephone Encounter (Signed)
Brief Outpatient Oncology Nutrition Note  Patient has been identified to be at risk on malnutrition screen.   Wt Readings from Last 10 Encounters:  03/26/13 187 lb 8 oz (85.049 kg)  03/19/13 194 lb (87.998 kg)  03/13/13 192 lb 9.6 oz (87.363 kg)  03/12/13 194 lb 6.4 oz (88.179 kg)  03/06/13 197 lb 4.8 oz (89.495 kg)  02/27/13 198 lb 12.8 oz (90.175 kg)  02/20/13 205 lb 8 oz (93.214 kg)  02/13/13 210 lb 4.8 oz (95.391 kg)  02/08/13 207 lb 12.8 oz (94.257 kg)  02/06/13 207 lb 9.6 oz (94.167 kg)    Called patient due to continued weight loss.  Patient reports coming in for IV fluids Tuesday, Wednesday, and Thursday of this week.  Now taking 5 Boost Plus daily.  Patient mixes 1 bottle of Boost Plus and 8 oz water and drinks quickly.  States that if he drinks it more slowly he does not get as much in.  Tolerates it better with water plus the added benefit of increasing fluid intake.  Patient states that he experiences pain with intake but it has been improving.  Now taking Carafate before intake and is to see his GI MD on Tuesday.  Tolerates room temperature liquids best Increased pain with hot or cold beverages. Bought some baby food and plans on trying that.  Does not tolerate applesauce secondary to "too acid".  They have a food processor and blender.  Discussed using these to blend foods as well.  Patient to increase to 6 cans Boost Plus daily.  Planning on trying pureed foods.  [facility] in the past and will call or come by if questions or continued weight loss.  Oran Rein, RD, LDN Clinical Inpatient Dietitian Pager:  [phone] Weekend and after hours pager:  [phone]

## 2013-04-03 ENCOUNTER — Other Ambulatory Visit: Payer: Self-pay | Admitting: Gastroenterology

## 2013-04-03 ENCOUNTER — Ambulatory Visit: Payer: Medicare PPO | Admitting: Oncology

## 2013-04-03 ENCOUNTER — Telehealth: Payer: Self-pay | Admitting: Oncology

## 2013-04-03 DIAGNOSIS — R1011 Right upper quadrant pain: Secondary | ICD-10-CM

## 2013-04-03 NOTE — Telephone Encounter (Signed)
pt called and wanted to r/s due to having another appt with Dr. Randa Evens today....done

## 2013-04-05 ENCOUNTER — Ambulatory Visit
Admission: RE | Admit: 2013-04-05 | Discharge: 2013-04-05 | Disposition: A | Discharge status: Home / Self Care | Payer: Medicare PPO | Source: Ambulatory Visit | Attending: Gastroenterology | Admitting: Gastroenterology

## 2013-04-05 ENCOUNTER — Ambulatory Visit: Payer: Medicare PPO | Admitting: Radiation Oncology

## 2013-04-05 DIAGNOSIS — R1011 Right upper quadrant pain: Secondary | ICD-10-CM

## 2013-04-10 ENCOUNTER — Telehealth: Payer: Self-pay | Admitting: Radiation Oncology

## 2013-04-10 NOTE — Telephone Encounter (Signed)
Opened in error

## 2013-04-20 HISTORY — PX: ESOPHAGECTOMY: SUR457

## 2013-05-10 ENCOUNTER — Ambulatory Visit (HOSPITAL_BASED_OUTPATIENT_CLINIC_OR_DEPARTMENT_OTHER): Payer: Medicare PPO | Admitting: Oncology

## 2013-05-10 ENCOUNTER — Telehealth: Payer: Self-pay | Admitting: Oncology

## 2013-05-10 VITALS — BP 104/68 | HR 98 | Temp 97.6°F | Resp 18 | Ht 73.0 in | Wt 180.1 lb

## 2013-05-10 DIAGNOSIS — C159 Malignant neoplasm of esophagus, unspecified: Secondary | ICD-10-CM

## 2013-05-10 NOTE — Progress Notes (Signed)
   [facility]    OFFICE PROGRESS NOTE   INTERVAL HISTORY:   He returns as scheduled. He underwent an esophagogastrectomy at [facility] on 04/20/2013. Matthew White continues to recover from surgery. He developed hoarseness following surgery and was made n.p.o. He is scheduled for a followup appointment at [facility] next week. There was recent drainage from a right chest tube site with surrounding erythema. This has improved. He complains of pain at the mid back. He is on tube feedings via a jejunostomy tube. The pathology from the esophagectomy revealed a grade 1 (well-differentiated) tumor invading into the submucosa. Tumor involved the distal esophagus and GE junction. The surgical margins were negative. No lymphovascular or perineural invasion. A left gastric pedicle lymph node was positive for metastatic adenocarcinoma.   Objective:  Vital signs in last 24 hours:  Blood pressure 104/68, pulse 98, temperature 97.6 F (36.4 C), temperature source Oral, resp. rate 18, height 6\' 1"  (1.854 m), weight 180 lb 1.6 oz (81.693 kg).    HEENT: Neck without mass Lymphatics: No cervical or supraclavicular nodes Resp: Inspiratory rub at the left upper posterior chest, no respiratory distress, good air movement bilaterally Cardio: Regular rate and rhythm GI: No hepatomegaly, nontender, left upper quadrant feeding tube site without evidence of infection Vascular: No leg edema  Skin: Midline upper abdomen and low neck incision is with staples in place. Right posterior chest tube site with minimal surrounding erythema. No drainage.  Medications: I have reviewed the patient's current medications.  Assessment/Plan: 1. 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  -Status post an esophagogastrectomy 04/20/2013  with the pathology confirming a pT1b,pN1 tumor, grade 1 with negative surgical margins 2. History of Barrett's esophagus.  3. Adult onset diabetes.  4. Odynophagia/early satiety. The odynophagia is better.  5. postoperative hoarseness-now in by mouth, scheduled for a swallowing evaluation at [facility] on 05/17/2013 6. Placement of a jejunostomy feeding tube-now maintained on home tube feedings for nutrition    Disposition:  He appears to be recovering well from the esophagectomy procedure. I discussed the pathology report with Matthew White and his wife. There is no indication for adjuvant therapy. He will continue postoperative care with Dr. Ewing White. Matthew White will return for an office visit here in 4 months. I do not plan to order surveillance CT scans. I told him Dr. Ewing White may decide to obtain a CT scan in approximately 6 months.   Thornton Papas, MD  05/10/2013  2:28 PM

## 2013-05-10 NOTE — Telephone Encounter (Signed)
Gave pt appt for MD only on September 2014

## 2013-07-06 ENCOUNTER — Telehealth: Payer: Self-pay | Admitting: Oncology

## 2013-07-06 NOTE — Telephone Encounter (Signed)
pt called and r/s Md visit to 9/15 per pt's wife request

## 2013-07-18 ENCOUNTER — Other Ambulatory Visit: Payer: Self-pay

## 2013-08-27 ENCOUNTER — Telehealth: Payer: Self-pay | Admitting: Oncology

## 2013-08-27 ENCOUNTER — Ambulatory Visit (HOSPITAL_BASED_OUTPATIENT_CLINIC_OR_DEPARTMENT_OTHER): Payer: Medicare PPO | Admitting: Oncology

## 2013-08-27 VITALS — BP 107/59 | HR 112 | Temp 97.5°F | Resp 20 | Ht 73.0 in | Wt 174.0 lb

## 2013-08-27 DIAGNOSIS — C159 Malignant neoplasm of esophagus, unspecified: Secondary | ICD-10-CM

## 2013-08-27 NOTE — Progress Notes (Signed)
   [facility]    OFFICE PROGRESS NOTE   INTERVAL HISTORY:   He returns as scheduled. He feels well. He reports a cough since undergoing the esophagogastrectomy in May. The cough has improved since he started nortriptyline. No dysphagia. The jejunostomy tube has been removed. He drinks 2 cans of a nutrition supplement per day. He reports anorexia, but he is eating.  Intermittent discomfort at the right upper back. No dyspnea. He has noted tachycardia since being treated with chemotherapy for the esophagus cancer.  Hoarseness improved temporarily when he received a "collagen "injection by ENT at [facility]. He is scheduled for a followup at [facility] next week. The hoarseness has returned.  Objective:  Vital signs in last 24 hours:  Blood pressure 107/59, pulse 112, temperature 97.5 F (36.4 C), temperature source Oral, resp. rate 20, height 6\' 1"  (1.854 m), weight 174 lb (78.926 kg).    HEENT: Neck without mass Lymphatics: No cervical, supraclavicular, or axillary nodes Resp: Lungs clear bilaterally, no respiratory distress Cardio: Tachycardia, regular rate and rhythm GI: No hepatomegaly, nontender, no mass Vascular: No leg edema    Medications: I have reviewed the patient's current medications.  Assessment/Plan: 1. 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  -Status post an esophagogastrectomy 04/20/2013 with the pathology confirming a pT1b,pN1 tumor, grade 1 with negative surgical margins  2. History of Barrett's esophagus.  3. Adult onset diabetes.  4. Odynophagia/early satiety. The odynophagia is better.  5. postoperative hoarseness-status post collagen injection therapy at [facility]  Disposition:  He remains in clinical remission from esophagus cancer. He continues to lose  weight despite a feeding supplement. He is scheduled for a followup ENT appointment at [facility] next week for evaluation of the hoarseness. He will see Dr. Ewing Schlein with a restaging CT in November.  Mr. Hijazi will return for an office visit here in 3 months. I have a low clinical suspicion for a pulmonary embolism or primary cardiac event to explain the tachycardia.   Thornton Papas, MD  08/27/2013  4:01 PM

## 2013-08-27 NOTE — Telephone Encounter (Signed)
Gave pt appt for MD on December 2014

## 2013-09-10 ENCOUNTER — Ambulatory Visit: Payer: Medicare PPO | Admitting: Oncology

## 2013-10-13 DIAGNOSIS — Z8709 Personal history of other diseases of the respiratory system: Secondary | ICD-10-CM

## 2013-10-13 HISTORY — DX: Personal history of other diseases of the respiratory system: Z87.09

## 2013-10-15 ENCOUNTER — Telehealth: Payer: Self-pay | Admitting: Oncology

## 2013-10-15 NOTE — Telephone Encounter (Signed)
Pt called and r/s MD on December 2014, to March 2015 , nurse notified

## 2013-10-18 ENCOUNTER — Other Ambulatory Visit: Payer: Self-pay

## 2013-11-12 HISTORY — PX: LARYNX SURGERY: SHX692

## 2013-11-17 IMAGING — CT NM PET TUM IMG INITIAL (PI) SKULL BASE T - THIGH
1 of 5 series · 1 of 25 positions shown · non-contrast
Comparison: Chest CT 12/29/2012

CLINICAL DATA: Initial treatment strategy for esophageal carcinoma.

NUCLEAR MEDICINE PET SKULL BASE TO THIGH
Fasting Blood Glucose:  164
TECHNIQUE: 19.3 mCi F-18 FDG was injected intravenously. CT data
was obtained and used for attenuation correction and anatomic
localization only.  (This was not acquired as a diagnostic CT
examination.) Additional exam technical data entered on
technologist worksheet.

[Series 2: pet nac · axial · 3.3mm · 4.69mm/px · 1 of 267 slices shown]
[im 134/267]
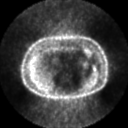

[1 of 25 positions shown; findings below may reference images not displayed]

FINDINGS: Neck: No hypermetabolic nodes in the neck.

Chest:  There is a long segment of hypermetabolic activity within
the distal third esophagus beginning below the carina with SUV max
= 6.8.  This is associated with circumferential esophageal
thickening seen on the CT scanning  and comparison CT scan. There
are several small para esophageal lymph nodes which do not have
clear metabolic activity but are very small in size including lymph
node on image 113 measuring 7 mm.

No mediastinal lymphadenopathy identified.  No suspicious pulmonary
nodules.

Abdomen/Pelvis:  There is hypermetabolic gastrohepatic ligament
lymph node measuring 9 mm (image 130) with SUV max = 7.2.  No
additional hypermetabolic lymph nodes are present.  There is no
abnormal hypermetabolic activity within the liver.

Physiologic activity noted within the gastric antrum and colon.  No
evidence of pelvic lymphadenopathy.

Skeleton:  No focal hypermetabolic activity to suggest skeletal
metastasis.
IMPRESSION: 1..  Hypermetabolic thickening through the distal esophagus
consistent primary esophageal carcinoma.
2.  Evidence of local nodal metastasis with hypermetabolic
gastrohepatic ligament lymph node.  T

3.   No evidence of liver metastasis.
4.  No evidence of systemic disease.

## 2013-11-27 ENCOUNTER — Ambulatory Visit: Payer: Medicare PPO | Admitting: Oncology

## 2014-02-15 ENCOUNTER — Telehealth: Payer: Self-pay | Admitting: Oncology

## 2014-02-15 ENCOUNTER — Ambulatory Visit (HOSPITAL_BASED_OUTPATIENT_CLINIC_OR_DEPARTMENT_OTHER): Payer: Medicare PPO | Admitting: Oncology

## 2014-02-15 VITALS — BP 103/59 | HR 98 | Temp 97.1°F | Resp 18 | Ht 73.0 in | Wt 159.4 lb

## 2014-02-15 DIAGNOSIS — C155 Malignant neoplasm of lower third of esophagus: Secondary | ICD-10-CM

## 2014-02-15 DIAGNOSIS — C159 Malignant neoplasm of esophagus, unspecified: Secondary | ICD-10-CM

## 2014-02-15 DIAGNOSIS — R1310 Dysphagia, unspecified: Secondary | ICD-10-CM

## 2014-02-15 DIAGNOSIS — D491 Neoplasm of unspecified behavior of respiratory system: Secondary | ICD-10-CM

## 2014-02-15 DIAGNOSIS — E119 Type 2 diabetes mellitus without complications: Secondary | ICD-10-CM

## 2014-02-15 NOTE — Progress Notes (Addendum)
OFFICE PROGRESS NOTE  Interval history:  Matthew White returns for followup of esophagus cancer. He reports progressive dysphagia over the past 2-4 months. He is scheduled for a dilatation procedure on 02/25/2014. He continues to have problems with fatigue. When he becomes fatigued he develops pain at the right low back with some associated shortness of breath. Symptoms are relieved with "stretching". Appetite is "lousy". He reports his weight is stable. He has occasional diarrhea and takes Imodium as needed. He reports a constant cough. No fever.  Chest CT done at [facility] on 02/14/2014 showed a new masslike partially calcified lesion in the right lower lobe peripherally. There were some new lower lobe reticulonodular opacities. There was a peripherally located right lower lobe superior segment nodule that had increased in size.  He reports he is scheduled for a PET scan on 02/18/2014.   Objective: Filed Vitals:   02/15/14 1023  BP: 103/59  Pulse: 98  Temp: 97.1 F (36.2 C)  Resp: 18   Oropharynx is without thrush or ulceration. No palpable cervical, supraclavicular, axillary or inguinal lymph nodes. Lungs are clear. Breath sounds are diminished globally. Regular cardiac rhythm. Abdomen soft and nontender. No hepatomegaly. No leg edema.   Lab Results: Lab Results  Component Value Date   WBC 6.8 03/12/2013   HGB 12.1* 03/12/2013   HCT 35.2* 03/12/2013   MCV 91.3 03/12/2013   PLT 243 03/12/2013   NEUTROABS 4.0 03/12/2013    Chemistry:    Chemistry      Component Value Date/Time   NA 144 03/12/2013 1507   K 3.8 03/12/2013 1507   CL 104 03/12/2013 1507   CO2 28 03/12/2013 1507   BUN 19.0 03/12/2013 1507   CREATININE 1.1 03/12/2013 1507      Component Value Date/Time   CALCIUM 9.6 03/12/2013 1507   ALKPHOS 67 03/12/2013 1507   AST 22 03/12/2013 1507   ALT 19 03/12/2013 1507   BILITOT 0.57 03/12/2013 1507       Studies/Results: No results found.  Medications: I have reviewed the patient's  current medications.  Assessment/Plan: 1. 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  -Status post an esophagogastrectomy 04/20/2013 with the pathology confirming a pT1b,pN1 tumor, grade 1 with negative surgical margins.  2. History of Barrett's esophagus.  3. Adult onset diabetes.  4. Odynophagia/early satiety. The odynophagia is better.  5. Postoperative hoarseness-status post collagen injection therapy at [facility]. 6. Dysphagia progressive over the past 2-4 months. He is sheduled for a dilatation procedure in the near future. 7. Chest CT 02/14/2014 showed a new masslike partially calcified lesion in the right lower lobe peripherally. There were some new lower lobe reticulonodular opacities. A peripherally located right lower lobe superior segment nodule was larger.   Dispositon-Dr. Benay Spice reviewed the CT report and images on the computer with Mr. Sliter and his wife. He is scheduled for a PET scan on 02/18/2014 and a dilatation procedure on 02/25/2014. He will return for a followup visit here on 03/04/2014. He will contact the office in the interim with any problems.  Patient seen with Dr. Benay Spice. 30 minutes were spent face-to-face at today's visit with the majority of that time involving counseling/coordination of care.   Ned Card ANP/GNP-BC    This was a shared visit with Ned Card. I reviewed the 02/14/2014 chest CT images with Mr. Pasillas  and his wife. The pleural based masslike density at the right lower lung could represent metastatic esophagus cancer, a primary lung tumor, or an inflammatory process. He is scheduled for further diagnostic evaluation at [facility]. He will return for an office visit here 03/04/2014.  Julieanne Manson, M.D.

## 2014-02-15 NOTE — Telephone Encounter (Signed)
gv adn printed appt sched and vs for pt for March

## 2014-02-25 ENCOUNTER — Other Ambulatory Visit: Payer: Self-pay | Admitting: Nurse Practitioner

## 2014-03-04 ENCOUNTER — Ambulatory Visit (HOSPITAL_COMMUNITY)
Admission: RE | Admit: 2014-03-04 | Discharge: 2014-03-04 | Disposition: A | Discharge status: Home / Self Care | Payer: Medicare PPO | Source: Ambulatory Visit | Attending: Oncology | Admitting: Oncology

## 2014-03-04 ENCOUNTER — Ambulatory Visit (HOSPITAL_BASED_OUTPATIENT_CLINIC_OR_DEPARTMENT_OTHER): Payer: Medicare PPO | Admitting: Oncology

## 2014-03-04 ENCOUNTER — Telehealth: Payer: Self-pay | Admitting: Oncology

## 2014-03-04 ENCOUNTER — Other Ambulatory Visit: Payer: Self-pay | Admitting: Radiology

## 2014-03-04 ENCOUNTER — Telehealth: Payer: Self-pay | Admitting: *Deleted

## 2014-03-04 VITALS — BP 128/65 | HR 99 | Temp 96.9°F | Resp 18 | Ht 73.0 in | Wt 158.3 lb

## 2014-03-04 DIAGNOSIS — E119 Type 2 diabetes mellitus without complications: Secondary | ICD-10-CM

## 2014-03-04 DIAGNOSIS — R911 Solitary pulmonary nodule: Secondary | ICD-10-CM

## 2014-03-04 DIAGNOSIS — R1310 Dysphagia, unspecified: Secondary | ICD-10-CM

## 2014-03-04 DIAGNOSIS — C159 Malignant neoplasm of esophagus, unspecified: Secondary | ICD-10-CM

## 2014-03-04 DIAGNOSIS — K2270 Barrett's esophagus without dysplasia: Secondary | ICD-10-CM

## 2014-03-04 DIAGNOSIS — M949 Disorder of cartilage, unspecified: Secondary | ICD-10-CM

## 2014-03-04 DIAGNOSIS — M899 Disorder of bone, unspecified: Secondary | ICD-10-CM

## 2014-03-04 DIAGNOSIS — M25559 Pain in unspecified hip: Secondary | ICD-10-CM | POA: Insufficient documentation

## 2014-03-04 DIAGNOSIS — C155 Malignant neoplasm of lower third of esophagus: Secondary | ICD-10-CM

## 2014-03-04 NOTE — Progress Notes (Signed)
   [facility]    OFFICE PROGRESS NOTE   INTERVAL HISTORY:   Mr. Chiasson returns as scheduled. He underwent an upper endoscopy with dilatation of the anastomosis and pylorus on 02/25/2014. He reports his cough resolved after this procedure.  A PET scan on 02/19/2014 revealed bilateral hypermetabolic pulmonary and pleural nodules with hypermetabolic right hilar lymphadenopathy. Multiple hypermetabolic bone lesions were noted including a lesion at the medial left clavicle and subtrochanteric right femur. A focus of increased activity was noted in the left side of the prostate.  He has tenderness over the medial clavicle. No back pain. No other complaint.  Objective:  Vital signs in last 24 hours:  Blood pressure 128/65, pulse 99, temperature 96.9 F (36.1 C), temperature source Oral, resp. rate 18, height 6\' 1"  (1.854 m), weight 158 lb 4.8 oz (71.804 kg), SpO2 100.00%.    HEENT: Neck without mass Lymphatics: No cervical, supraclavicular, axillary, or inguinal nodes Resp: Lungs clear bilaterally Cardio: Regular rate and rhythm GI: No hepatomegaly, nontender Vascular: No leg edema Musculoskeletal: Tender over the left clavicle, no mass. No spine tenderness     Medications: I have reviewed the patient's current medications.  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  -Status post an esophagogastrectomy 04/20/2013 with the pathology confirming a pT1b,pN1 tumor, grade 1 with negative surgical margins.  2. History of Barrett's esophagus.  3. Adult onset diabetes.  4. Odynophagia/early satiety. The odynophagia is better.  5. Postoperative hoarseness-status post collagen injection therapy at [facility].  6. Dysphagia progressive over the past 2-4 months. He is  sheduled for a dilatation procedure in the near future.  7. Chest CT 02/14/2014 showed a new masslike partially calcified lesion in the right lower lobe peripherally. There were some new lower lobe reticulonodular opacities. A peripherally located right lower lobe superior segment nodule was larger.   PET scan 02/19/2014 with evidence of multiple hypermetabolic lung nodules, hypermetabolic bone lesions, and a hypermetabolic focus at the left side of the prostate  Disposition:  Mr. Kamphaus has a history of esophagus cancer. The staging PET scan reveals multiple areas of hypermetabolism consistent with metastatic disease. I reviewed the PET scan findings with Mr. Yarbrough and his wife. He understands the PET scan is most likely indicative of metastatic esophagus cancer. No therapy will be curative. We can consider palliative radiation for a symptomatic metastasis.  I recommended an imaging directed biopsy of one of the bone or lung lesions to confirm the diagnosis. We will then consider systemic chemotherapy options. He will be referred for placement of a Port-A-Cath. Mr. Presti indicated he would like to proceed with a trial of chemotherapy.  He will be referred for the biopsy and return for an office visit on 03/12/2014.     Betsy Coder, MD  03/04/2014  6:51 PM

## 2014-03-04 NOTE — Telephone Encounter (Signed)
VM from radiology asking if patient has the CD from [facility] to schedule the biopsy? Per Dr. Benay Spice, he has the CD and will deliver to radiology in person when he knows where the biopsy will take place.

## 2014-03-04 NOTE — Telephone Encounter (Signed)
Biopsy will be on 3/26 at [facility] per EPIC note. Notified scheduling that Dr. Benay Spice will bring CD to radiology department.

## 2014-03-04 NOTE — Telephone Encounter (Signed)
gv and printd aptp sched and avs for pt for [place]...sent pt to radiology

## 2014-03-05 ENCOUNTER — Encounter: Payer: Self-pay | Admitting: *Deleted

## 2014-03-06 ENCOUNTER — Encounter (HOSPITAL_COMMUNITY): Payer: Self-pay | Admitting: Pharmacy Technician

## 2014-03-07 ENCOUNTER — Ambulatory Visit (HOSPITAL_COMMUNITY)
Admission: RE | Admit: 2014-03-07 | Discharge: 2014-03-07 | Disposition: A | Discharge status: Home / Self Care | Payer: Medicare PPO | Source: Ambulatory Visit | Attending: Oncology | Admitting: Oncology

## 2014-03-07 ENCOUNTER — Encounter (HOSPITAL_COMMUNITY): Payer: Self-pay

## 2014-03-07 ENCOUNTER — Other Ambulatory Visit: Payer: Self-pay | Admitting: Radiology

## 2014-03-07 ENCOUNTER — Ambulatory Visit (HOSPITAL_COMMUNITY)
Admission: RE | Admit: 2014-03-07 | Discharge: 2014-03-07 | Disposition: A | Discharge status: Home / Self Care | Payer: Medicare PPO | Source: Ambulatory Visit | Attending: Interventional Radiology | Admitting: Interventional Radiology

## 2014-03-07 ENCOUNTER — Other Ambulatory Visit: Payer: Self-pay | Admitting: Oncology

## 2014-03-07 ENCOUNTER — Ambulatory Visit
Admission: RE | Admit: 2014-03-07 | Discharge: 2014-03-07 | Disposition: A | Discharge status: Home / Self Care | Payer: Self-pay | Source: Ambulatory Visit | Attending: Oncology | Admitting: Oncology

## 2014-03-07 VITALS — BP 91/54 | HR 67 | Temp 97.5°F | Resp 16

## 2014-03-07 DIAGNOSIS — Z79899 Other long term (current) drug therapy: Secondary | ICD-10-CM | POA: Insufficient documentation

## 2014-03-07 DIAGNOSIS — Z87891 Personal history of nicotine dependence: Secondary | ICD-10-CM | POA: Insufficient documentation

## 2014-03-07 DIAGNOSIS — C159 Malignant neoplasm of esophagus, unspecified: Secondary | ICD-10-CM

## 2014-03-07 DIAGNOSIS — K219 Gastro-esophageal reflux disease without esophagitis: Secondary | ICD-10-CM | POA: Insufficient documentation

## 2014-03-07 DIAGNOSIS — E119 Type 2 diabetes mellitus without complications: Secondary | ICD-10-CM | POA: Insufficient documentation

## 2014-03-07 DIAGNOSIS — C3430 Malignant neoplasm of lower lobe, unspecified bronchus or lung: Secondary | ICD-10-CM | POA: Insufficient documentation

## 2014-03-07 DIAGNOSIS — Z794 Long term (current) use of insulin: Secondary | ICD-10-CM | POA: Insufficient documentation

## 2014-03-07 DIAGNOSIS — R911 Solitary pulmonary nodule: Secondary | ICD-10-CM | POA: Insufficient documentation

## 2014-03-07 HISTORY — DX: Personal history of other diseases of the respiratory system: Z87.09

## 2014-03-07 LAB — CBC WITH DIFFERENTIAL/PLATELET
BASOS PCT: 0 % (ref 0–1)
Basophils Absolute: 0 10*3/uL (ref 0.0–0.1)
EOS PCT: 4 % (ref 0–5)
Eosinophils Absolute: 0.3 10*3/uL (ref 0.0–0.7)
HEMATOCRIT: 39.6 % (ref 39.0–52.0)
Hemoglobin: 12.9 g/dL — ABNORMAL LOW (ref 13.0–17.0)
Lymphocytes Relative: 29 % (ref 12–46)
Lymphs Abs: 2 10*3/uL (ref 0.7–4.0)
MCH: 29.2 pg (ref 26.0–34.0)
MCHC: 32.6 g/dL (ref 30.0–36.0)
MCV: 89.6 fL (ref 78.0–100.0)
MONO ABS: 0.9 10*3/uL (ref 0.1–1.0)
Monocytes Relative: 12 % (ref 3–12)
Neutro Abs: 3.9 10*3/uL (ref 1.7–7.7)
Neutrophils Relative %: 55 % (ref 43–77)
Platelets: 253 10*3/uL (ref 150–400)
RBC: 4.42 MIL/uL (ref 4.22–5.81)
RDW: 14.9 % (ref 11.5–15.5)
WBC: 7.1 10*3/uL (ref 4.0–10.5)

## 2014-03-07 LAB — PROTIME-INR
INR: 1 (ref 0.00–1.49)
Prothrombin Time: 13 seconds (ref 11.6–15.2)

## 2014-03-07 LAB — GLUCOSE, CAPILLARY
GLUCOSE-CAPILLARY: 88 mg/dL (ref 70–99)
Glucose-Capillary: 78 mg/dL (ref 70–99)

## 2014-03-07 LAB — APTT: aPTT: 38 seconds — ABNORMAL HIGH (ref 24–37)

## 2014-03-07 MED ORDER — SODIUM CHLORIDE 0.9 % IV SOLN
INTRAVENOUS | Status: DC
  Administered 2014-03-07: 08:00:00 via INTRAVENOUS

## 2014-03-07 MED ORDER — FENTANYL CITRATE 0.05 MG/ML IJ SOLN
INTRAMUSCULAR | Status: AC
  Filled 2014-03-07: qty 6

## 2014-03-07 MED ORDER — MIDAZOLAM HCL 2 MG/2ML IJ SOLN
INTRAMUSCULAR | Status: AC | PRN
  Administered 2014-03-07 (×2): 1 mg via INTRAVENOUS
  Administered 2014-03-07: 0.5 mg via INTRAVENOUS
  Administered 2014-03-07: 1 mg via INTRAVENOUS

## 2014-03-07 MED ORDER — FENTANYL CITRATE 0.05 MG/ML IJ SOLN
INTRAMUSCULAR | Status: AC | PRN
  Administered 2014-03-07 (×3): 25 ug via INTRAVENOUS

## 2014-03-07 MED ORDER — MIDAZOLAM HCL 2 MG/2ML IJ SOLN
INTRAMUSCULAR | Status: AC
  Filled 2014-03-07: qty 6

## 2014-03-07 MED ORDER — CEFAZOLIN SODIUM-DEXTROSE 2-3 GM-% IV SOLR: 2.0000 g | Freq: Once | INTRAVENOUS | Status: DC

## 2014-03-07 NOTE — Discharge Instructions (Signed)
Needle Biopsy of Lung, Care After  Refer to this sheet in the next few weeks. These instructions provide you with information on caring for yourself after your procedure. Your health care provider may also give you more specific instructions. Your treatment has been planned according to current medical practices, but problems sometimes occur. Call your health care provider if you have any problems or questions after your procedure. WHAT TO EXPECT AFTER THE PROCEDURE A bandage will be applied over the areas where the needle was inserted. You may be asked to apply pressure to the bandage for several minutes to ensure there is minimal bleeding. In most cases, you can leave when your needle biopsy procedure is completed. Do not drive yourself home. Someone else should take you home. If you received an IV sedative or general anesthetic, you will be taken to a comfortable place to relax while the medication wears off. If you have upcoming travel scheduled, talk to your doctor about when it is safe to travel by air after the procedure. HOME CARE INSTRUCTIONS Expect to take it easy for the rest of the day. Protect the area where you received the needle biopsy by keeping the bandage in place for as long as instructed. You may feel some mild pain or discomfort in the area, but this should stop in a day or two. Only take over-the-counter or prescription medicines for pain, discomfort, or fever as directed by your caregiver. SEEK MEDICAL CARE IF:   You have pain at the biopsy site that worsens or is not helped by medication.  You have swelling or drainage at the needle biopsy site.  You have a fever. SEEK IMMEDIATE MEDICAL CARE IF:   You have new or worsening shortness of breath.  You have chest pain.  You are coughing up blood.  You have bleeding that does not stop with pressure or a bandage.  You develop light-headedness or fainting. Document Released: 09/26/2007 Document Revised: 08/01/2013 Document  Reviewed: 04/23/2013 [facility] Patient Information 2014 [place].  Conscious Sedation, Adult, Care After Refer to this sheet in the next few weeks. These instructions provide you with information on caring for yourself after your procedure. Your health care provider may also give you more specific instructions. Your treatment has been planned according to current medical practices, but problems sometimes occur. Call your health care provider if you have any problems or questions after your procedure. WHAT TO EXPECT AFTER THE PROCEDURE  After your procedure:  You may feel sleepy, clumsy, and have poor balance for several hours.  Vomiting may occur if you eat too soon after the procedure. HOME CARE INSTRUCTIONS  Do not participate in any activities where you could become injured for at least 24 hours. Do not:  Drive.  Swim.  Ride a bicycle.  Operate heavy machinery.  Cook.  Use power tools.  Climb ladders.  Work from a high place.  Do not make important decisions or sign legal documents until you are improved.  If you vomit, drink water, juice, or soup when you can drink without vomiting. Make sure you have little or no nausea before eating solid foods.  Only take over-the-counter or prescription medicines for pain, discomfort, or fever as directed by your health care provider.  Make sure you and your family fully understand everything about the medicines given to you, including what side effects may occur.  You should not drink alcohol, take sleeping pills, or take medicines that cause drowsiness for at least 24 hours.  If you smoke, do not smoke without supervision.  If you are feeling better, you may resume normal activities 24 hours after you were sedated.  Keep all appointments with your health care provider. SEEK MEDICAL CARE IF:  Your skin is pale or bluish in color.  You continue to feel nauseous or vomit.  Your pain is getting worse and is not helped  by medicine.  You have bleeding or swelling.  You are still sleepy or feeling clumsy after 24 hours. SEEK IMMEDIATE MEDICAL CARE IF:  You develop a rash.  You have difficulty breathing.  You develop any type of allergic problem.  You have a fever. MAKE SURE YOU:  Understand these instructions.  Will watch your condition.  Will get help right away if you are not doing well or get worse. Document Released: 09/19/2013 Document Reviewed: 07/06/2013 [facility] Patient Information 2014 [place], [place].

## 2014-03-07 NOTE — Progress Notes (Addendum)
Patient stated his systolic BP usually runs in the 80's and 90's. He is asymptomatic and denies dizziness or lightheadedness. Notified Dr. Annamaria Boots of readings.

## 2014-03-07 NOTE — Procedures (Signed)
Successful RLL NODULE 18G CORE BX NO COMP STABLE PATH PENDING FULL REPORT IN PACS

## 2014-03-07 NOTE — H&P (Signed)
Chief Complaint: "I'm here for a biopsy" Referring Physician:Sherrill HPI: Matthew White is an 80 y.o. male with hx of esophageal cancer. He now has new areas on recent CT concerning for metastatic or possibly new malignant process. A PET scan was done at outside facility but has been reviewed and is mostly concerning for some hypermetabolic bone lesions and right lung lesions. He was scheduled for biopsy of one of these lesions and will also be set up for Portacath as he is expected to start chemotherapy. PMHx and meds reviewed. PET reviewed by Dr. Annamaria Boots, feels best option for biopsy is RLL lesion.   Past Medical History:  Past Medical History  Diagnosis Date  . Gout     no flare up in ten years   . Barrett esophagus   . Esophageal cancer 12/22/2012    invasive adenocarcinoma arising in a background of barrett's mucosa  . Diabetes mellitus without complication     type II; last A1C 6.5 10/2012  . GERD (gastroesophageal reflux disease)     managed by prilosec  . Hx of pleural effusion 10/2013    left. pt states it was drained at [facility].     Past Surgical History:  Past Surgical History  Procedure Laterality Date  . Nissen fundoplication    . Tonsillectomy    . Laryngoscopy    . Esophagogastroduodenoscopy  10/18/2012    Procedure: ESOPHAGOGASTRODUODENOSCOPY (EGD);  Surgeon: Winfield Cunas., MD;  Location: [facility];  Service: Endoscopy;  Laterality: N/A;  need xray  . Savory dilation  10/18/2012    Procedure: SAVORY DILATION;  Surgeon: Winfield Cunas., MD;  Location: [facility];  Service: Endoscopy;  Laterality: N/A;  . Esophagogastroduodenoscopy  12/22/2012    Procedure: ESOPHAGOGASTRODUODENOSCOPY (EGD);  Surgeon: Winfield Cunas., MD;  Location: [facility];  Service: Endoscopy;  Laterality: N/A;  with C-Arm  . Savory dilation  12/22/2012    Procedure: SAVORY DILATION;  Surgeon: Winfield Cunas., MD;  Location: [facility];  Service: Endoscopy;  Laterality: N/A;   . Eus  01/10/2013    Procedure: ESOPHAGEAL ENDOSCOPIC ULTRASOUND (EUS) RADIAL;  Surgeon: Arta Silence, MD;  Location: [facility];  Service: Endoscopy;  Laterality: N/A;  . Removal of breast tissue    . Esophagectomy  04/20/2013  . Larynx surgery  11/2013    LARYNGEAL REPAIR     Family History:  Family History  Problem Relation Age of Onset  . Cancer Father     pancreatic cancer; questionable miss dx  . Dementia Father   . Cancer Mother     cervical    Social History:  reports that he quit smoking about 30 years ago. He started smoking about 59 years ago. He has never used smokeless tobacco. He reports that he does not drink alcohol or use illicit drugs.  Allergies:  Allergies  Allergen Reactions  . Ivp Dye [Iodinated Diagnostic Agents] Swelling    13 hr prep/reaction was 20 years ago per patient    Medications:   Medication List    ASK your doctor about these medications       acetaminophen 325 MG tablet  Commonly known as:  TYLENOL  Take 650 mg by mouth every 4 (four) hours as needed (Pain).     ALPRAZolam 1 MG tablet  Commonly known as:  XANAX  Take 0.5 mg by mouth at bedtime as needed. For sleep     insulin glargine 100 UNIT/ML injection  Commonly known as:  LANTUS  Inject 10 Units into the skin at bedtime.     insulin lispro 100 UNIT/ML injection  Commonly known as:  HUMALOG  Inject 4-6 Units into the skin 3 (three) times daily before meals. For glucose below 250.  Takes 6 units 250 and above     lactose free nutrition Liqd  Take 237 mLs by mouth 3 (three) times daily between meals.     ENSURE  Take 237 mLs by mouth 3 (three) times daily. Will use boost or ensure     multivitamin with minerals Tabs tablet  Take 1 tablet by mouth daily.     omeprazole 20 MG capsule  Commonly known as:  PRILOSEC  Take 20 mg by mouth daily as needed (Acid Reflux).     vitamin A & D ointment  Apply 1 application topically at bedtime. Applied to buttocks         Please HPI for pertinent positives, otherwise complete 10 system ROS negative.  Physical Exam: BP 105/45  Pulse 85  Temp(Src) 97.4 F (36.3 C) (Oral)  Resp 18  SpO2 94% There is no weight on file to calculate BMI.   General Appearance:  Alert, cooperative, no distress, appears stated age  Head:  Normocephalic, without obvious abnormality, atraumatic  ENT: Unremarkable  Neck: Supple, symmetrical, trachea midline  Lungs:   Clear to auscultation bilaterally, no w/r/r, respirations unlabored without use of accessory muscles.  Chest Wall:  No tenderness or deformity  Heart:  Regular rate and rhythm, S1, S2 normal, no murmur, rub or gallop.  Abdomen:   Soft, non-tender, non distended.  Neurologic: Normal affect, no gross deficits.   Results for orders placed during the hospital encounter of 03/07/14 (from the past 48 hour(s))  APTT     Status: Abnormal   Collection Time    03/07/14  8:15 AM      Result Value Ref Range   aPTT 38 (*) 24 - 37 seconds   Comment:            IF BASELINE aPTT IS ELEVATED,     SUGGEST PATIENT RISK ASSESSMENT     BE USED TO DETERMINE APPROPRIATE     ANTICOAGULANT THERAPY.  CBC WITH DIFFERENTIAL     Status: Abnormal   Collection Time    03/07/14  8:15 AM      Result Value Ref Range   WBC 7.1  4.0 - 10.5 K/uL   RBC 4.42  4.22 - 5.81 MIL/uL   Hemoglobin 12.9 (*) 13.0 - 17.0 g/dL   HCT 39.6  39.0 - 52.0 %   MCV 89.6  78.0 - 100.0 fL   MCH 29.2  26.0 - 34.0 pg   MCHC 32.6  30.0 - 36.0 g/dL   RDW 14.9  11.5 - 15.5 %   Platelets 253  150 - 400 K/uL   Neutrophils Relative % 55  43 - 77 %   Neutro Abs 3.9  1.7 - 7.7 K/uL   Lymphocytes Relative 29  12 - 46 %   Lymphs Abs 2.0  0.7 - 4.0 K/uL   Monocytes Relative 12  3 - 12 %   Monocytes Absolute 0.9  0.1 - 1.0 K/uL   Eosinophils Relative 4  0 - 5 %   Eosinophils Absolute 0.3  0.0 - 0.7 K/uL   Basophils Relative 0  0 - 1 %   Basophils Absolute 0.0  0.0 - 0.1 K/uL  PROTIME-INR     Status: None  Collection Time    03/07/14  8:15 AM      Result Value Ref Range   Prothrombin Time 13.0  11.6 - 15.2 seconds   INR 1.00  0.00 - 1.49   No results found.  Assessment/Plan Hx of Esophageal cancer RLL hypermetabolic lesion For CT guided biopsy today Discussed procedure, risks, complications, use of sedation. Discussed possibility of PTX, which may require chest tube and admission. Discussed possibility of bleeding/hemmorhage. Labs reviewed. Consent signed in chart  Ascencion Dike PA-C 03/07/2014, 9:36 AM

## 2014-03-11 ENCOUNTER — Ambulatory Visit (HOSPITAL_COMMUNITY)
Admission: RE | Admit: 2014-03-11 | Discharge: 2014-03-11 | Disposition: A | Discharge status: Home / Self Care | Payer: Medicare PPO | Source: Ambulatory Visit | Attending: Oncology | Admitting: Oncology

## 2014-03-11 ENCOUNTER — Encounter (HOSPITAL_COMMUNITY): Payer: Self-pay

## 2014-03-11 ENCOUNTER — Other Ambulatory Visit: Payer: Self-pay | Admitting: Oncology

## 2014-03-11 DIAGNOSIS — C159 Malignant neoplasm of esophagus, unspecified: Secondary | ICD-10-CM | POA: Insufficient documentation

## 2014-03-11 DIAGNOSIS — K219 Gastro-esophageal reflux disease without esophagitis: Secondary | ICD-10-CM | POA: Insufficient documentation

## 2014-03-11 DIAGNOSIS — Z79899 Other long term (current) drug therapy: Secondary | ICD-10-CM | POA: Insufficient documentation

## 2014-03-11 DIAGNOSIS — Z87891 Personal history of nicotine dependence: Secondary | ICD-10-CM | POA: Insufficient documentation

## 2014-03-11 DIAGNOSIS — Z794 Long term (current) use of insulin: Secondary | ICD-10-CM | POA: Insufficient documentation

## 2014-03-11 DIAGNOSIS — E119 Type 2 diabetes mellitus without complications: Secondary | ICD-10-CM | POA: Insufficient documentation

## 2014-03-11 LAB — GLUCOSE, CAPILLARY: Glucose-Capillary: 90 mg/dL (ref 70–99)

## 2014-03-11 MED ORDER — CEFAZOLIN SODIUM-DEXTROSE 2-3 GM-% IV SOLR
INTRAVENOUS | Status: AC
  Administered 2014-03-11: 2 g via INTRAVENOUS
  Filled 2014-03-11: qty 50

## 2014-03-11 MED ORDER — HEPARIN SOD (PORK) LOCK FLUSH 100 UNIT/ML IV SOLN
INTRAVENOUS | Status: AC | PRN
  Administered 2014-03-11: 500 [IU]

## 2014-03-11 MED ORDER — HEPARIN SOD (PORK) LOCK FLUSH 100 UNIT/ML IV SOLN
INTRAVENOUS | Status: AC
Start: 2014-03-11 — End: 2014-03-11
  Filled 2014-03-11: qty 5

## 2014-03-11 MED ORDER — SODIUM CHLORIDE 0.9 % IV SOLN
INTRAVENOUS | Status: DC
  Administered 2014-03-11: 10:00:00 via INTRAVENOUS

## 2014-03-11 MED ORDER — MIDAZOLAM HCL 2 MG/2ML IJ SOLN
INTRAMUSCULAR | Status: AC | PRN
  Administered 2014-03-11: 1 mg via INTRAVENOUS
  Administered 2014-03-11: 2 mg via INTRAVENOUS
  Administered 2014-03-11: 0.5 mg via INTRAVENOUS

## 2014-03-11 MED ORDER — FENTANYL CITRATE 0.05 MG/ML IJ SOLN
INTRAMUSCULAR | Status: AC | PRN
  Administered 2014-03-11: 50 ug via INTRAVENOUS

## 2014-03-11 MED ORDER — FENTANYL CITRATE 0.05 MG/ML IJ SOLN
INTRAMUSCULAR | Status: AC
  Filled 2014-03-11: qty 6

## 2014-03-11 MED ORDER — CEFAZOLIN SODIUM-DEXTROSE 2-3 GM-% IV SOLR
2.0000 g | INTRAVENOUS | Status: AC
  Administered 2014-03-11: 2 g via INTRAVENOUS

## 2014-03-11 MED ORDER — LIDOCAINE-EPINEPHRINE 2 %-1:200000 IJ SOLN
INTRAMUSCULAR | Status: AC
Start: 2014-03-11 — End: 2014-03-11
  Filled 2014-03-11: qty 20

## 2014-03-11 MED ORDER — MIDAZOLAM HCL 2 MG/2ML IJ SOLN
INTRAMUSCULAR | Status: AC
Start: 2014-03-11 — End: 2014-03-11
  Filled 2014-03-11: qty 6

## 2014-03-11 NOTE — Discharge Instructions (Signed)
Implanted Port Home Guide °An implanted port is a type of central line that is placed under the skin. Central lines are used to provide IV access when treatment or nutrition needs to be given through a person's veins. Implanted ports are used for long-term IV access. An implanted port may be placed because:  °· You need IV medicine that would be irritating to the small veins in your hands or arms.   °· You need long-term IV medicines, such as antibiotics.   °· You need IV nutrition for a long period.   °· You need frequent blood draws for lab tests.   °· You need dialysis.   °Implanted ports are usually placed in the chest area, but they can also be placed in the upper arm, the abdomen, or the leg. An implanted port has two main parts:  °· Reservoir. The reservoir is round and will appear as a small, raised area under your skin. The reservoir is the part where a needle is inserted to give medicines or draw blood.   °· Catheter. The catheter is a thin, flexible tube that extends from the reservoir. The catheter is placed into a large vein. Medicine that is inserted into the reservoir goes into the catheter and then into the vein.   °HOW WILL I CARE FOR MY INCISION SITE? °Do not get the incision site wet. Bathe or shower as directed by your health care provider.  °HOW IS MY PORT ACCESSED? °Special steps must be taken to access the port:  °· Before the port is accessed, a numbing cream can be placed on the skin. This helps numb the skin over the port site.   °· Your health care provider uses a sterile technique to access the port. °· Your health care provider must put on a mask and sterile gloves. °· The skin over your port is cleaned carefully with an antiseptic and allowed to dry. °· The port is gently pinched between sterile gloves, and a needle is inserted into the port. °· Only "non-coring" port needles should be used to access the port. Once the port is accessed, a blood return should be checked. This helps  ensure that the port is in the vein and is not clogged.   °· If your port needs to remain accessed for a constant infusion, a clear (transparent) bandage will be placed over the needle site. The bandage and needle will need to be changed every week, or as directed by your health care provider.   °· Keep the bandage covering the needle clean and dry. Do not get it wet. Follow your health care provider's instructions on how to take a shower or bath while the port is accessed.   °· If your port does not need to stay accessed, no bandage is needed over the port.   °WHAT IS FLUSHING? °Flushing helps keep the port from getting clogged. Follow your health care provider's instructions on how and when to flush the port. Ports are usually flushed with saline solution or a medicine called heparin. The need for flushing will depend on how the port is used.  °· If the port is used for intermittent medicines or blood draws, the port will need to be flushed:   °· After medicines have been given.   °· After blood has been drawn.   °· As part of routine maintenance.   °· If a constant infusion is running, the port may not need to be flushed.   °HOW LONG WILL MY PORT STAY IMPLANTED? °The port can stay in for as long as your health care   provider thinks it is needed. When it is time for the port to come out, surgery will be done to remove it. The procedure is similar to the one performed when the port was put in.  °WHEN SHOULD I SEEK IMMEDIATE MEDICAL CARE? °When you have an implanted port, you should seek immediate medical care if:  °· You notice a bad smell coming from the incision site.   °· You have swelling, redness, or drainage at the incision site.   °· You have more swelling or pain at the port site or the surrounding area.   °· You have a fever that is not controlled with medicine. °Document Released: 11/29/2005 Document Revised: 09/19/2013 Document Reviewed: 08/06/2013 °Exit[employer]® Patient Information ©2014 [employer]. °Moderate Sedation, Adult °Moderate sedation is given to help you relax or even sleep through a procedure. You may remain sleepy, be clumsy, or have poor balance for several hours following this procedure. Arrange for a responsible adult, family member, or friend to take you home. A responsible adult should stay with you for at least 24 hours or until the medicines have worn off. °· Do not participate in any activities where you could become injured for the next 24 hours, or until you feel normal again. Do not: °· Drive. °· Swim. °· Ride a bicycle. °· Operate heavy machinery. °· Cook. °· Use power tools. °· Climb ladders. °· Work at heights. °· Do not make important decisions or sign legal documents until you are improved. °· Vomiting may occur if you eat too soon. When you can drink without vomiting, try water, juice, or soup. Try solid foods if you feel little or no nausea. °· Only take over-the-counter or prescription medications for pain, discomfort, or fever as directed by your caregiver.If pain medications have been prescribed for you, ask your caregiver how soon it is safe to take them. °· Make sure you and your family fully understands everything about the medication given to you. Make sure you understand what side effects may occur. °· You should not drink alcohol, take sleeping pills, or medications that cause drowsiness for at least 24 hours. °· If you smoke, do not smoke alone. °· If you are feeling better, you may resume normal activities 24 hours after receiving sedation. °· Keep all appointments as scheduled. Follow all instructions. °· Ask questions if you do not understand. °SEEK MEDICAL CARE IF:  °· Your skin is pale or bluish in color. °· You continue to feel sick to your stomach (nauseous) or throw up (vomit). °· Your pain is getting worse and not helped by medication. °· You have bleeding or swelling. °· You are still sleepy or feeling clumsy after 24 hours. °SEEK IMMEDIATE MEDICAL CARE IF:   °· You develop a rash. °· You have difficulty breathing. °· You develop any type of allergic problem. °· You have a fever. °Document Released: 08/24/2001 Document Revised: 02/21/2012 Document Reviewed: 08/06/2013 °ExitCare® Patient Information ©2014 [employer]. ° °

## 2014-03-11 NOTE — Procedures (Signed)
Successful placement of right IJ approach port-a-cath with tip at the superior caval atrial junction. The catheter is ready for immediate use. No immediate post procedural complications.

## 2014-03-11 NOTE — H&P (Signed)
Chief Complaint: "I'm here for a portacath" Referring Physician:Sherrill HPI: Matthew White is an 80 y.o. male with hx of esophageal cancer. He now has new areas on recent CT concerning for metastatic or possibly new malignant process. A PET scan was done at outside facility but has been reviewed and is mostly concerning for some hypermetabolic bone lesions and right lung lesions. He underwent CT guided biopsy of the RLL lesion last week. He has done fine since then, no complications. Pathology is still pending on that. He is now scheduled for portacath today as he is expected to start chemotherapy after seeing his Oncologist tomorrow. PMHx and meds reviewed.   Past Medical History:  Past Medical History  Diagnosis Date  . Gout     no flare up in ten years   . Barrett esophagus   . Esophageal cancer 12/22/2012    invasive adenocarcinoma arising in a background of barrett's mucosa  . Diabetes mellitus without complication     type II; last A1C 6.5 10/2012  . GERD (gastroesophageal reflux disease)     managed by prilosec  . Hx of pleural effusion 10/2013    left. pt states it was drained at [facility].     Past Surgical History:  Past Surgical History  Procedure Laterality Date  . Nissen fundoplication    . Tonsillectomy    . Laryngoscopy    . Esophagogastroduodenoscopy  10/18/2012    Procedure: ESOPHAGOGASTRODUODENOSCOPY (EGD);  Surgeon: Winfield Cunas., MD;  Location: [facility];  Service: Endoscopy;  Laterality: N/A;  need xray  . Savory dilation  10/18/2012    Procedure: SAVORY DILATION;  Surgeon: Winfield Cunas., MD;  Location: [facility];  Service: Endoscopy;  Laterality: N/A;  . Esophagogastroduodenoscopy  12/22/2012    Procedure: ESOPHAGOGASTRODUODENOSCOPY (EGD);  Surgeon: Winfield Cunas., MD;  Location: [facility];  Service: Endoscopy;  Laterality: N/A;  with C-Arm  . Savory dilation  12/22/2012    Procedure: SAVORY DILATION;  Surgeon: Winfield Cunas., MD;   Location: [facility];  Service: Endoscopy;  Laterality: N/A;  . Eus  01/10/2013    Procedure: ESOPHAGEAL ENDOSCOPIC ULTRASOUND (EUS) RADIAL;  Surgeon: Arta Silence, MD;  Location: [facility];  Service: Endoscopy;  Laterality: N/A;  . Removal of breast tissue    . Esophagectomy  04/20/2013  . Larynx surgery  11/2013    LARYNGEAL REPAIR     Family History:  Family History  Problem Relation Age of Onset  . Cancer Father     pancreatic cancer; questionable miss dx  . Dementia Father   . Cancer Mother     cervical    Social History:  reports that he quit smoking about 30 years ago. He started smoking about 59 years ago. He has never used smokeless tobacco. He reports that he does not drink alcohol or use illicit drugs.  Allergies:  Allergies  Allergen Reactions  . Ivp Dye [Iodinated Diagnostic Agents] Swelling    13 hr prep/reaction was 20 years ago per patient    Medications:   Medication List    ASK your doctor about these medications       acetaminophen 325 MG tablet  Commonly known as:  TYLENOL  Take 650 mg by mouth every 4 (four) hours as needed (Pain).     ALPRAZolam 1 MG tablet  Commonly known as:  XANAX  Take 0.5 mg by mouth at bedtime as needed. For sleep     Fish Oil 875  MG Caps  Take by mouth 2 (two) times daily. Takes two pills BID     ibuprofen 200 MG tablet  Commonly known as:  ADVIL,MOTRIN  Take 200 mg by mouth every 6 (six) hours as needed.     insulin glargine 100 UNIT/ML injection  Commonly known as:  LANTUS  Inject 10 Units into the skin at bedtime.     insulin lispro 100 UNIT/ML injection  Commonly known as:  HUMALOG  Inject 4-6 Units into the skin 3 (three) times daily before meals. For glucose below 250.  Takes 6 units 250 and above     lactose free nutrition Liqd  Take 237 mLs by mouth 3 (three) times daily between meals.     ENSURE  Take 237 mLs by mouth 3 (three) times daily. Will use boost or ensure     multivitamin with  minerals Tabs tablet  Take 1 tablet by mouth daily.     omeprazole 20 MG capsule  Commonly known as:  PRILOSEC  Take 20 mg by mouth daily as needed (Acid Reflux).     vitamin A & D ointment  Apply 1 application topically at bedtime. Applied to buttocks        Please HPI for pertinent positives, otherwise complete 10 system ROS negative.  Physical Exam: Temp: 97.4, HR: 72, RR: 18, BP: 114/98, O2: 94%   General Appearance:  Alert, cooperative, no distress, appears stated age  Head:  Normocephalic, without obvious abnormality, atraumatic  ENT: Unremarkable  Neck: Supple, symmetrical, trachea midline  Lungs:   Clear to auscultation bilaterally, no w/r/r, respirations unlabored without use of accessory muscles. Bx site clean, no hematoma  Chest Wall:  No tenderness or deformity  Heart:  Regular rate and rhythm, S1, S2 normal, no murmur, rub or gallop.  Abdomen:   Soft, non-tender, non distended.  Neurologic: Normal affect, no gross deficits.   Labs: CBC    Component Value Date/Time   WBC 7.1 03/07/2014 0815   RBC 4.42 03/07/2014 0815   HGB 12.9* 03/07/2014 0815   HCT 39.6 03/07/2014 0815   PLT 253 03/07/2014 0815   INR/Prothrombin Time 13.0/1.0  PTT 38   Assessment/Plan Hx of Esophageal cancer RLL hypermetabolic lesion, s/p biopsy---->pathology pending For Portacath placement today Discussed procedure, risks, complications, use of sedation. Labs from 3/26 ok Consent signed in chart  Ascencion Dike PA-C 03/11/2014, 10:23 AM

## 2014-03-12 ENCOUNTER — Ambulatory Visit (HOSPITAL_BASED_OUTPATIENT_CLINIC_OR_DEPARTMENT_OTHER): Payer: Medicare PPO | Admitting: Oncology

## 2014-03-12 ENCOUNTER — Telehealth: Payer: Self-pay | Admitting: *Deleted

## 2014-03-12 ENCOUNTER — Other Ambulatory Visit (HOSPITAL_COMMUNITY): Payer: Self-pay | Admitting: Student

## 2014-03-12 ENCOUNTER — Encounter: Payer: Self-pay | Admitting: Oncology

## 2014-03-12 ENCOUNTER — Ambulatory Visit (HOSPITAL_BASED_OUTPATIENT_CLINIC_OR_DEPARTMENT_OTHER): Payer: Medicare PPO

## 2014-03-12 ENCOUNTER — Telehealth: Payer: Self-pay | Admitting: Oncology

## 2014-03-12 VITALS — BP 114/53 | HR 75 | Temp 97.4°F | Resp 18 | Ht 73.0 in | Wt 162.6 lb

## 2014-03-12 DIAGNOSIS — M949 Disorder of cartilage, unspecified: Secondary | ICD-10-CM

## 2014-03-12 DIAGNOSIS — C782 Secondary malignant neoplasm of pleura: Secondary | ICD-10-CM

## 2014-03-12 DIAGNOSIS — C159 Malignant neoplasm of esophagus, unspecified: Secondary | ICD-10-CM

## 2014-03-12 DIAGNOSIS — R1310 Dysphagia, unspecified: Secondary | ICD-10-CM

## 2014-03-12 DIAGNOSIS — E119 Type 2 diabetes mellitus without complications: Secondary | ICD-10-CM

## 2014-03-12 DIAGNOSIS — M899 Disorder of bone, unspecified: Secondary | ICD-10-CM

## 2014-03-12 DIAGNOSIS — C155 Malignant neoplasm of lower third of esophagus: Secondary | ICD-10-CM

## 2014-03-12 LAB — COMPREHENSIVE METABOLIC PANEL (CC13)
ALBUMIN: 3.4 g/dL — AB (ref 3.5–5.0)
ALT: <6 U/L (ref 0–55)
AST: 12 U/L (ref 5–34)
Alkaline Phosphatase: 72 U/L (ref 40–150)
Anion Gap: 8 mEq/L (ref 3–11)
BUN: 20.1 mg/dL (ref 7.0–26.0)
CALCIUM: 9.5 mg/dL (ref 8.4–10.4)
CHLORIDE: 108 meq/L (ref 98–109)
CO2: 26 mEq/L (ref 22–29)
Creatinine: 0.8 mg/dL (ref 0.7–1.3)
Glucose: 110 mg/dl (ref 70–140)
POTASSIUM: 4.2 meq/L (ref 3.5–5.1)
Sodium: 142 mEq/L (ref 136–145)
TOTAL PROTEIN: 6.8 g/dL (ref 6.4–8.3)
Total Bilirubin: 0.45 mg/dL (ref 0.20–1.20)

## 2014-03-12 LAB — CEA: CEA: <0.5 ng/mL (ref 0.0–5.0)

## 2014-03-12 MED ORDER — LIDOCAINE-PRILOCAINE 2.5-2.5 % EX CREA: 1.0000 "application " | TOPICAL_CREAM | CUTANEOUS | Status: DC | PRN

## 2014-03-12 MED ORDER — CAPECITABINE 500 MG PO TABS: ORAL_TABLET | ORAL | Status: DC

## 2014-03-12 MED ORDER — PROCHLORPERAZINE MALEATE 5 MG PO TABS: 5.0000 mg | ORAL_TABLET | Freq: Four times a day (QID) | ORAL | Status: AC | PRN

## 2014-03-12 NOTE — Progress Notes (Signed)
Faxed xeloda prescription to [facility]

## 2014-03-12 NOTE — Telephone Encounter (Signed)
Spoke with pt and wife: Dr. Benay Spice discussed biopsy results with pathologist. Consistent with esophageal cancer. Plan for Oxali/ Xeloda as scheduled. Specimen has been sent for Her2 testing. Both verbalized understanding.

## 2014-03-12 NOTE — Telephone Encounter (Signed)
gv pt appt schedule for april.

## 2014-03-12 NOTE — Progress Notes (Signed)
[facility] OFFICE PROGRESS NOTE   Diagnosis: Metastatic esophagus cancer  INTERVAL HISTORY:   Mr. Fitzwater returns as scheduled. He underwent biopsy of a right chest mass 03/07/2014. A Port-A-Cath was placed 03/11/2014. He has noted improvement in the left clavicle pain. He has soreness at the right clavicle and Port-A-Cath site. No other complaint. I discussed the pathology with Dr. Avis Epley. He indicates the biopsy is consistent with metastatic esophagus cancer.  Objective:  Vital signs in last 24 hours:  There were no vitals taken for this visit.    HEENT: Neck without mass. Resp: Lungs clear bilaterally Cardio: Regular in rhythm GI: No hepatomegaly Vascular: No leg edema  Portacath/PICC-without erythema-gauze dressing in place at the right upper chest Port-A-Cath site  Lab Results:  Lab Results  Component Value Date   WBC 7.1 03/07/2014   HGB 12.9* 03/07/2014   HCT 39.6 03/07/2014   MCV 89.6 03/07/2014   PLT 253 03/07/2014   NEUTROABS 3.9 03/07/2014     Imaging:  Ir Fluoro Guide Cv Line Right  03/11/2014   INDICATION: History of esophageal cancer, in need of intravenous access for chemotherapy administration  EXAM: IMPLANTED PORT A CATH PLACEMENT WITH ULTRASOUND AND FLUOROSCOPIC GUIDANCE  COMPARISON:  CT BIOPSY dated 03/07/2014; NM PET IMAGE INITIAL (PI) SKULL BASE TO THIGH dated 01/04/2013  MEDICATIONS: Ancef 2 gm IV; IV antibiotic was given in an appropriate time interval prior to skin puncture.  ANESTHESIA/SEDATION: Conscious sedation was achieved with intravenous Versed and fentanyl  Total Moderate Sedation Time  25 minutes.  CONTRAST:  None  FLUOROSCOPY TIME:  54 seconds.  COMPLICATIONS: None immediate  PROCEDURE: The procedure, risks, benefits, and alternatives were explained to the patient. Questions regarding the procedure were encouraged and answered. The patient understands and consents to the procedure.  The right neck and chest were prepped with  chlorhexidine in a sterile fashion, and a sterile drape was applied covering the operative field. Maximum barrier sterile technique with sterile gowns and gloves were used for the procedure. A timeout was performed prior to the initiation of the procedure. Local anesthesia was provided with 1% lidocaine with epinephrine.  After creating a small venotomy incision, a micropuncture kit was utilized to access the internal jugular vein under direct, real-time ultrasound guidance. Ultrasound image documentation was performed. The microwire was kinked to measure appropriate catheter length.  A subcutaneous port pocket was then created along the upper chest wall utilizing a combination of sharp and blunt dissection. The pocket was irrigated with sterile saline. A single lumen thin power injectable port was chosen for placement. The 8 Fr catheter was tunneled from the port pocket site to the venotomy incision. The port was placed in the pocket. The external catheter was trimmed to appropriate length. At the venotomy, an 8 Fr peel-away sheath was placed over a guidewire under fluoroscopic guidance. The catheter was then placed through the sheath and the sheath was removed. Final catheter positioning was confirmed and documented with a fluoroscopic spot radiograph. The port was accessed with a Huber needle, aspirated and flushed with heparinized saline.  The venotomy site was closed with an interrupted 4-0 Vicryl suture. The port pocket incision was closed with interrupted 2-0 Vicryl suture and the skin was opposed with a running subcuticular 4-0 Vicryl suture. Dermabond and Steri-strips were applied to both incisions. Dressings were placed. The patient tolerated the procedure well without immediate post procedural complication.  FINDINGS: After catheter placement, the tip lies within the superior cavoatrial junction. The  catheter aspirates and flushes normally and is ready for immediate use.  IMPRESSION: Successful placement  of a right internal jugular approach power injectable Port-A-Cath. The catheter is ready for immediate use.   Electronically Signed   By: Sandi Mariscal M.D.   On: 03/11/2014 13:19   Ir US Guide Vasc Access Right  03/11/2014   INDICATION: History of esophageal cancer, in need of intravenous access for chemotherapy administration  EXAM: IMPLANTED PORT A CATH PLACEMENT WITH ULTRASOUND AND FLUOROSCOPIC GUIDANCE  COMPARISON:  CT BIOPSY dated 03/07/2014; NM PET IMAGE INITIAL (PI) SKULL BASE TO THIGH dated 01/04/2013  MEDICATIONS: Ancef 2 gm IV; IV antibiotic was given in an appropriate time interval prior to skin puncture.  ANESTHESIA/SEDATION: Conscious sedation was achieved with intravenous Versed and fentanyl  Total Moderate Sedation Time  25 minutes.  CONTRAST:  None  FLUOROSCOPY TIME:  54 seconds.  COMPLICATIONS: None immediate  PROCEDURE: The procedure, risks, benefits, and alternatives were explained to the patient. Questions regarding the procedure were encouraged and answered. The patient understands and consents to the procedure.  The right neck and chest were prepped with chlorhexidine in a sterile fashion, and a sterile drape was applied covering the operative field. Maximum barrier sterile technique with sterile gowns and gloves were used for the procedure. A timeout was performed prior to the initiation of the procedure. Local anesthesia was provided with 1% lidocaine with epinephrine.  After creating a small venotomy incision, a micropuncture kit was utilized to access the internal jugular vein under direct, real-time ultrasound guidance. Ultrasound image documentation was performed. The microwire was kinked to measure appropriate catheter length.  A subcutaneous port pocket was then created along the upper chest wall utilizing a combination of sharp and blunt dissection. The pocket was irrigated with sterile saline. A single lumen thin power injectable port was chosen for placement. The 8 Fr catheter was  tunneled from the port pocket site to the venotomy incision. The port was placed in the pocket. The external catheter was trimmed to appropriate length. At the venotomy, an 8 Fr peel-away sheath was placed over a guidewire under fluoroscopic guidance. The catheter was then placed through the sheath and the sheath was removed. Final catheter positioning was confirmed and documented with a fluoroscopic spot radiograph. The port was accessed with a Huber needle, aspirated and flushed with heparinized saline.  The venotomy site was closed with an interrupted 4-0 Vicryl suture. The port pocket incision was closed with interrupted 2-0 Vicryl suture and the skin was opposed with a running subcuticular 4-0 Vicryl suture. Dermabond and Steri-strips were applied to both incisions. Dressings were placed. The patient tolerated the procedure well without immediate post procedural complication.  FINDINGS: After catheter placement, the tip lies within the superior cavoatrial junction. The catheter aspirates and flushes normally and is ready for immediate use.  IMPRESSION: Successful placement of a right internal jugular approach power injectable Port-A-Cath. The catheter is ready for immediate use.   Electronically Signed   By: Sandi Mariscal M.D.   On: 03/11/2014 13:19    Medications: I have reviewed the patient's current medications.  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  -Status post an esophagogastrectomy 04/20/2013 with the pathology confirming a pT1b,pN1 tumor, grade 1 with negative surgical margins.  2. History of Barrett's esophagus.  3. Adult onset diabetes.  4.  Odynophagia/early satiety. The odynophagia is better.  5. Postoperative hoarseness-status post collagen injection therapy at [facility].  6.  Dysphagia progressive over the past 2-4 months. He is sheduled for a dilatation procedure in the near future.  7. Chest CT 02/14/2014 showed a new masslike partially calcified lesion in the right lower lobe peripherally. There were some new lower lobe reticulonodular opacities. A peripherally located right lower lobe superior segment nodule was larger.   PET scan 02/19/2014 with evidence of multiple hypermetabolic lung nodules, hypermetabolic bone lesions, and a hypermetabolic focus at the left side of the prostate  CT biopsy of a right lower lobe subpleural nodule 03/07/2014 confirmed metastatic adenocarcinoma consistent with esophagus cancer.  8. Port-A-Cath placement 03/11/2014    Disposition:  Mr. Puleo has been diagnosed with metastatic esophagus cancer. I discussed treatment options with Mr. Haydu and his wife. He would like to proceed with a trial of palliative chemotherapy. I explained there are multiple chemotherapy regimens that have significant activity in this disease. He plans to take several vacations over the next few months. I recommend CAPOX chemotherapy in an attempt to work around his schedule and limit toxicity.  We reviewed the potential toxicities associated with this regimen including the chance for nausea/vomiting, mucositis, diarrhea, and hematologic toxicity. We discussed the various types of neuropathy associated with oxaliplatin. We reviewed the rash, hyperpigmentation, and hand/foot syndrome seen with capecitabine. He agrees to proceed. The plan is to begin a first treatment with CAPOX on 03/18/2014. The plan is to deliver continuous capecitabine dosing as given in the EOX regimen.  We will check a HER-2/neu on the lung biopsy with the plan to add Herceptin as indicated.  Mr. Henton will return for an office visit and cycle 2 oxaliplatin on 04/08/2014.  Betsy Coder, MD  03/12/2014  8:36 AM

## 2014-03-12 NOTE — Addendum Note (Signed)
Addended by: Brien Few on: 03/12/2014 03:22 PM   Modules accepted: Orders

## 2014-03-13 ENCOUNTER — Telehealth: Payer: Self-pay | Admitting: *Deleted

## 2014-03-13 NOTE — Telephone Encounter (Signed)
Received call from [facility] reporting they are unable to fill pt's Rx. Prescription has been forwarded to [facility], per [employer].

## 2014-03-14 NOTE — Progress Notes (Signed)
RECEIVED A FAX FROM [facility] CONCERNING A NOTICE OF RX TRANSFER TO [facility].

## 2014-03-15 ENCOUNTER — Telehealth: Payer: Self-pay | Admitting: *Deleted

## 2014-03-15 NOTE — Telephone Encounter (Signed)
VM from wife asking for status of xeloda that is supposed to begin on Monday, April 6th. Says she is willing to pay out of pocket for a few days if necessary. Called and spoke with pharmacist with [facility]: Plan to ship drug by Monday for delivery Tuesday unless a Prior authorization holds things up. Made wife aware of this and that Dr. Benay Spice said it is not a problem to start the xeloda a few days late. Gave her phone # for [facility] to follow up as needed.

## 2014-03-17 ENCOUNTER — Other Ambulatory Visit: Payer: Self-pay | Admitting: Oncology

## 2014-03-18 ENCOUNTER — Telehealth: Payer: Self-pay | Admitting: Oncology

## 2014-03-18 ENCOUNTER — Ambulatory Visit (HOSPITAL_COMMUNITY)
Admission: RE | Admit: 2014-03-18 | Discharge: 2014-03-18 | Disposition: A | Discharge status: Home / Self Care | Payer: Medicare PPO | Source: Ambulatory Visit | Attending: Oncology | Admitting: Oncology

## 2014-03-18 ENCOUNTER — Other Ambulatory Visit: Payer: Self-pay

## 2014-03-18 ENCOUNTER — Ambulatory Visit (HOSPITAL_BASED_OUTPATIENT_CLINIC_OR_DEPARTMENT_OTHER): Payer: Medicare PPO

## 2014-03-18 ENCOUNTER — Other Ambulatory Visit: Payer: Self-pay | Admitting: Oncology

## 2014-03-18 VITALS — BP 131/66 | HR 93 | Temp 97.5°F

## 2014-03-18 DIAGNOSIS — C159 Malignant neoplasm of esophagus, unspecified: Secondary | ICD-10-CM

## 2014-03-18 DIAGNOSIS — M25519 Pain in unspecified shoulder: Secondary | ICD-10-CM | POA: Insufficient documentation

## 2014-03-18 DIAGNOSIS — C155 Malignant neoplasm of lower third of esophagus: Secondary | ICD-10-CM

## 2014-03-18 DIAGNOSIS — C782 Secondary malignant neoplasm of pleura: Secondary | ICD-10-CM

## 2014-03-18 DIAGNOSIS — Z5111 Encounter for antineoplastic chemotherapy: Secondary | ICD-10-CM

## 2014-03-18 DIAGNOSIS — W19XXXA Unspecified fall, initial encounter: Secondary | ICD-10-CM | POA: Insufficient documentation

## 2014-03-18 MED ORDER — HEPARIN SOD (PORK) LOCK FLUSH 100 UNIT/ML IV SOLN
500.0000 [IU] | Freq: Once | INTRAVENOUS | Status: AC | PRN
Start: 2014-03-18 — End: 2014-03-18
  Administered 2014-03-18: 500 [IU]
  Filled 2014-03-18: qty 5

## 2014-03-18 MED ORDER — DEXAMETHASONE SODIUM PHOSPHATE 10 MG/ML IJ SOLN
10.0000 mg | Freq: Once | INTRAMUSCULAR | Status: AC
  Administered 2014-03-18: 10 mg via INTRAVENOUS

## 2014-03-18 MED ORDER — DEXTROSE 5 % IV SOLN
Freq: Once | INTRAVENOUS | Status: AC
  Administered 2014-03-18: 15:00:00 via INTRAVENOUS

## 2014-03-18 MED ORDER — SODIUM CHLORIDE 0.9 % IJ SOLN
10.0000 mL | INTRAMUSCULAR | Status: DC | PRN
  Administered 2014-03-18: 10 mL
  Filled 2014-03-18: qty 10

## 2014-03-18 MED ORDER — OXYCODONE-ACETAMINOPHEN 5-325 MG PO TABS: 1.0000 | ORAL_TABLET | ORAL | Status: DC | PRN

## 2014-03-18 MED ORDER — DEXAMETHASONE SODIUM PHOSPHATE 10 MG/ML IJ SOLN
INTRAMUSCULAR | Status: AC
  Filled 2014-03-18: qty 1

## 2014-03-18 MED ORDER — OXYCODONE-ACETAMINOPHEN 5-325 MG PO TABS
1.0000 | ORAL_TABLET | Freq: Once | ORAL | Status: AC
Start: 2014-03-18 — End: 2014-03-18
  Administered 2014-03-18: 1 via ORAL

## 2014-03-18 MED ORDER — ONDANSETRON 8 MG/NS 50 ML IVPB
INTRAVENOUS | Status: AC
  Filled 2014-03-18: qty 8

## 2014-03-18 MED ORDER — ONDANSETRON 8 MG/50ML IVPB (CHCC)
8.0000 mg | Freq: Once | INTRAVENOUS | Status: AC
  Administered 2014-03-18: 8 mg via INTRAVENOUS

## 2014-03-18 MED ORDER — OXALIPLATIN CHEMO INJECTION 100 MG/20ML
100.0000 mg/m2 | Freq: Once | INTRAVENOUS | Status: AC
  Administered 2014-03-18: 195 mg via INTRAVENOUS
  Filled 2014-03-18: qty 39

## 2014-03-18 MED ORDER — OXYCODONE-ACETAMINOPHEN 5-325 MG PO TABS
ORAL_TABLET | ORAL | Status: AC
  Filled 2014-03-18: qty 1

## 2014-03-18 NOTE — Telephone Encounter (Signed)
charge nurse in inf informed of xray order. charge nurse will send pt for xray affter inf. no other orders.

## 2014-03-18 NOTE — Progress Notes (Signed)
I told pt to go to radiology after chemo for xray.

## 2014-03-18 NOTE — Patient Instructions (Signed)
[facility] Discharge Instructions for Patients Receiving Chemotherapy  Today you received the following chemotherapy agents: alimta, carboplatin  To help prevent nausea and vomiting after your treatment, we encourage you to take your nausea medication.  Take it as often as prescribed.     If you develop nausea and vomiting that is not controlled by your nausea medication, call the clinic. If it is after clinic hours your family physician or the after hours number for the clinic or go to the Emergency Department.   BELOW ARE SYMPTOMS THAT SHOULD BE REPORTED IMMEDIATELY:  *FEVER GREATER THAN 100.5 F  *CHILLS WITH OR WITHOUT FEVER  NAUSEA AND VOMITING THAT IS NOT CONTROLLED WITH YOUR NAUSEA MEDICATION  *UNUSUAL SHORTNESS OF BREATH  *UNUSUAL BRUISING OR BLEEDING  TENDERNESS IN MOUTH AND THROAT WITH OR WITHOUT PRESENCE OF ULCERS  *URINARY PROBLEMS  *BOWEL PROBLEMS  UNUSUAL RASH Items with * indicate a potential emergency and should be followed up as soon as possible.  Feel free to call the clinic you have any questions or concerns. The clinic phone number is [phone].   I have been informed and understand all the instructions given to me. I know to contact the clinic, my physician, or go to the Emergency Department if any problems should occur. I do not have any questions at this time, but understand that I may call the clinic during office hours   should I have any questions or need assistance in obtaining follow up care.    __________________________________________  _____________  __________ Signature of Patient or Authorized Representative            Date                   Time    __________________________________________ Nurse's Signature

## 2014-03-19 ENCOUNTER — Telehealth: Payer: Self-pay | Admitting: Medical Oncology

## 2014-03-19 ENCOUNTER — Encounter: Payer: Self-pay | Admitting: Oncology

## 2014-03-19 NOTE — Telephone Encounter (Signed)
Pt called and left a voicemail asking about his x-ray results of his clavicle. Per Dr. Benay Spice he did not fracture or break it. The x-ray shows the cancer spot that he has had but nothing new. Dr. Benay Spice thinks he has aggravated the spot with the fall. He would like for the pt to take his pain medication. Pt states that it was feeling better before his fall. He did state he did not receive his shipment of his cancer pills and he is not sure who he needs to contact. I informed him our office will follow up.

## 2014-03-19 NOTE — Telephone Encounter (Signed)
I called [facility] to check on the status of the xeloda. I was informed that a prior authorization was faxed to our office today. I informed the patient and he is upset. He states that someone told his wife last week it was approved. I explained I will check with our care managed department and have them follow up and give him a call. He voiced understanding.

## 2014-03-19 NOTE — Progress Notes (Addendum)
FAX FROM [facility] FOR XELODA AUTHORIZATION REQUEST.  WENT TO COVER MY MEDS AND COMPLETED AND SENT TO [employer].  WE WILL HEAR BACK IN 24 - 72 HOURS.  I DID MARK THE REQUEST URGENT.    03/20/14 IT WAS AT FIRST DENIED AND I DID AN URGENT APPEAL. CALLED PATIENT [phone] AND HE INQUIRED ABOUT GETTING A 4 - 5 DAY SUPPLY. IT WAS OVER 1,000.00.  03/22/14 IT WAS APPROVED. I TRIED [facility] AND BRYAN SAID IT HAD ALREADY BEEN FILLED.  HE CHECKED AND [facility] DID FILL IT. I  SPOKE TO MR AND MRS Knoch AND [facility] IS SUPPOSED TO DELIVER IT ON SATURDAY.  THEY WERE VERY UNHAPPY WITH [facility]

## 2014-03-21 ENCOUNTER — Telehealth: Payer: Self-pay | Admitting: *Deleted

## 2014-03-21 NOTE — Telephone Encounter (Signed)
Pt's wife called states "the pain medicine is not helping that much; his left shoulder is really hurting him and he's taking 1 percocet every 4 hours around the clock"  Pt's wife reports that MD did x-ray Monday and it was negative "but he's still hurting bad"  No other c/o voiced; denies fever/N/V; eating/drinking good.  Note to MD.

## 2014-03-21 NOTE — Telephone Encounter (Signed)
Per Dr. Benay Spice; called and spoke with pt instructing him MD said he can increase his pain med to 2 tablets (Percocet) every 4 hours as needed and if not better tomorrow to call office back to adjust med again.  Pt verbalized understanding of instruction and stated he thinks he has enough for now.

## 2014-03-22 ENCOUNTER — Other Ambulatory Visit: Payer: Self-pay | Admitting: Oncology

## 2014-03-22 DIAGNOSIS — C159 Malignant neoplasm of esophagus, unspecified: Secondary | ICD-10-CM

## 2014-03-26 MED ORDER — OXYCODONE HCL ER 10 MG PO T12A: 10.0000 mg | EXTENDED_RELEASE_TABLET | Freq: Two times a day (BID) | ORAL | Status: DC

## 2014-03-26 MED ORDER — OXYCODONE-ACETAMINOPHEN 5-325 MG PO TABS: 2.0000 | ORAL_TABLET | ORAL | Status: DC | PRN

## 2014-03-26 NOTE — Telephone Encounter (Signed)
Called to notify script is ready for pick up.  Spoke with Matthew White and explained to him he is not to drive while taking these medications.  "We only go to [employer] and My wife can drive.  Anywhere else I can walk to."

## 2014-03-26 NOTE — Telephone Encounter (Signed)
Patient communication printed for Dr. Benay Spice.  Orders transcribed per Dr. Benay Spice for patient not to drive while taking narcotics, Oxycontin 10 mg every 12 hours and to increase quantuty of oxycodone to 100 pills, XRT to clavicle if pain still severe.   Called patient and reached wife Matthew White.  Notified her of these orders.  Matthew White states the trip was postponed as he is down to 1 pill.  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  His pain to left shoulder is a ten on pain scale and he can barely move his arm.  When he takes two oxycodone his pain decreases to a three, relief lasts about three hours.  I am going to try to talk him into [place] not going camping."  Asked for a call when scripts are ready for pick up.  Matthew White will discuss possible radiation therapy today with 'Ed' and MD at f/u on 04-08-2014.

## 2014-04-07 ENCOUNTER — Other Ambulatory Visit: Payer: Self-pay | Admitting: Oncology

## 2014-04-08 ENCOUNTER — Other Ambulatory Visit: Payer: Self-pay | Admitting: *Deleted

## 2014-04-08 ENCOUNTER — Ambulatory Visit (HOSPITAL_BASED_OUTPATIENT_CLINIC_OR_DEPARTMENT_OTHER): Payer: Medicare PPO

## 2014-04-08 ENCOUNTER — Ambulatory Visit (HOSPITAL_BASED_OUTPATIENT_CLINIC_OR_DEPARTMENT_OTHER): Payer: Medicare PPO | Admitting: Oncology

## 2014-04-08 ENCOUNTER — Other Ambulatory Visit (HOSPITAL_BASED_OUTPATIENT_CLINIC_OR_DEPARTMENT_OTHER): Payer: Medicare PPO

## 2014-04-08 VITALS — BP 95/61 | HR 77 | Temp 97.4°F | Resp 17 | Ht 73.0 in | Wt 158.9 lb

## 2014-04-08 DIAGNOSIS — K5900 Constipation, unspecified: Secondary | ICD-10-CM

## 2014-04-08 DIAGNOSIS — C782 Secondary malignant neoplasm of pleura: Secondary | ICD-10-CM

## 2014-04-08 DIAGNOSIS — C159 Malignant neoplasm of esophagus, unspecified: Secondary | ICD-10-CM

## 2014-04-08 DIAGNOSIS — C155 Malignant neoplasm of lower third of esophagus: Secondary | ICD-10-CM

## 2014-04-08 DIAGNOSIS — Z5111 Encounter for antineoplastic chemotherapy: Secondary | ICD-10-CM

## 2014-04-08 DIAGNOSIS — M949 Disorder of cartilage, unspecified: Secondary | ICD-10-CM

## 2014-04-08 DIAGNOSIS — E119 Type 2 diabetes mellitus without complications: Secondary | ICD-10-CM

## 2014-04-08 DIAGNOSIS — R1310 Dysphagia, unspecified: Secondary | ICD-10-CM

## 2014-04-08 DIAGNOSIS — M899 Disorder of bone, unspecified: Secondary | ICD-10-CM

## 2014-04-08 LAB — COMPREHENSIVE METABOLIC PANEL (CC13)
ALT: 7 U/L (ref 0–55)
ANION GAP: 10 meq/L (ref 3–11)
AST: 14 U/L (ref 5–34)
Albumin: 3.6 g/dL (ref 3.5–5.0)
Alkaline Phosphatase: 78 U/L (ref 40–150)
BUN: 30.7 mg/dL — ABNORMAL HIGH (ref 7.0–26.0)
CALCIUM: 10.4 mg/dL (ref 8.4–10.4)
CHLORIDE: 104 meq/L (ref 98–109)
CO2: 28 meq/L (ref 22–29)
Creatinine: 1 mg/dL (ref 0.7–1.3)
Glucose: 157 mg/dl — ABNORMAL HIGH (ref 70–140)
POTASSIUM: 5 meq/L (ref 3.5–5.1)
SODIUM: 142 meq/L (ref 136–145)
TOTAL PROTEIN: 7.3 g/dL (ref 6.4–8.3)
Total Bilirubin: 0.39 mg/dL (ref 0.20–1.20)

## 2014-04-08 LAB — CBC WITH DIFFERENTIAL/PLATELET
BASO%: 0.6 % (ref 0.0–2.0)
Basophils Absolute: 0 10*3/uL (ref 0.0–0.1)
EOS%: 2.4 % (ref 0.0–7.0)
Eosinophils Absolute: 0.2 10*3/uL (ref 0.0–0.5)
HCT: 40 % (ref 38.4–49.9)
HGB: 13.1 g/dL (ref 13.0–17.1)
LYMPH#: 1.4 10*3/uL (ref 0.9–3.3)
LYMPH%: 21 % (ref 14.0–49.0)
MCH: 30.1 pg (ref 27.2–33.4)
MCHC: 32.6 g/dL (ref 32.0–36.0)
MCV: 92.3 fL (ref 79.3–98.0)
MONO#: 1 10*3/uL — AB (ref 0.1–0.9)
MONO%: 14.6 % — ABNORMAL HIGH (ref 0.0–14.0)
NEUT#: 4.2 10*3/uL (ref 1.5–6.5)
NEUT%: 61.4 % (ref 39.0–75.0)
Platelets: 249 10*3/uL (ref 140–400)
RBC: 4.33 10*6/uL (ref 4.20–5.82)
RDW: 16.9 % — ABNORMAL HIGH (ref 11.0–14.6)
WBC: 6.8 10*3/uL (ref 4.0–10.3)

## 2014-04-08 MED ORDER — DEXTROSE 5 % IV SOLN
Freq: Once | INTRAVENOUS | Status: AC
  Administered 2014-04-08: 13:00:00 via INTRAVENOUS

## 2014-04-08 MED ORDER — ONDANSETRON 8 MG/NS 50 ML IVPB
INTRAVENOUS | Status: AC
  Filled 2014-04-08: qty 8

## 2014-04-08 MED ORDER — ONDANSETRON 8 MG/50ML IVPB (CHCC)
8.0000 mg | Freq: Once | INTRAVENOUS | Status: AC
  Administered 2014-04-08: 8 mg via INTRAVENOUS

## 2014-04-08 MED ORDER — HEPARIN SOD (PORK) LOCK FLUSH 100 UNIT/ML IV SOLN
500.0000 [IU] | Freq: Once | INTRAVENOUS | Status: AC | PRN
  Administered 2014-04-08: 500 [IU]
  Filled 2014-04-08: qty 5

## 2014-04-08 MED ORDER — DEXAMETHASONE SODIUM PHOSPHATE 10 MG/ML IJ SOLN
INTRAMUSCULAR | Status: AC
  Filled 2014-04-08: qty 1

## 2014-04-08 MED ORDER — DEXTROSE 5 % IV SOLN
100.0000 mg/m2 | Freq: Once | INTRAVENOUS | Status: AC
  Administered 2014-04-08: 195 mg via INTRAVENOUS
  Filled 2014-04-08: qty 39

## 2014-04-08 MED ORDER — DEXAMETHASONE SODIUM PHOSPHATE 10 MG/ML IJ SOLN
10.0000 mg | Freq: Once | INTRAMUSCULAR | Status: AC
  Administered 2014-04-08: 10 mg via INTRAVENOUS

## 2014-04-08 MED ORDER — SODIUM CHLORIDE 0.9 % IJ SOLN
10.0000 mL | INTRAMUSCULAR | Status: DC | PRN
  Administered 2014-04-08: 10 mL
  Filled 2014-04-08: qty 10

## 2014-04-08 NOTE — Patient Instructions (Signed)
[facility] Discharge Instructions for Patients Receiving Chemotherapy  Today you received the following chemotherapy agents Oxaliplatin.  To help prevent nausea and vomiting after your treatment, we encourage you to take your nausea medication.   If you develop nausea and vomiting that is not controlled by your nausea medication, call the clinic.   BELOW ARE SYMPTOMS THAT SHOULD BE REPORTED IMMEDIATELY:  *FEVER GREATER THAN 100.5 F  *CHILLS WITH OR WITHOUT FEVER  NAUSEA AND VOMITING THAT IS NOT CONTROLLED WITH YOUR NAUSEA MEDICATION  *UNUSUAL SHORTNESS OF BREATH  *UNUSUAL BRUISING OR BLEEDING  TENDERNESS IN MOUTH AND THROAT WITH OR WITHOUT PRESENCE OF ULCERS  *URINARY PROBLEMS  *BOWEL PROBLEMS  UNUSUAL RASH Items with * indicate a potential emergency and should be followed up as soon as possible.  Feel free to call the clinic you have any questions or concerns. The clinic phone number is [phone].

## 2014-04-08 NOTE — Progress Notes (Signed)
  [facility] OFFICE PROGRESS NOTE   Diagnosis: Metastatic esophagus cancer  INTERVAL HISTORY:   Matthew White completed a first treatment with oxaliplatin 03/18/2014. He tolerated the oxaliplatin well. No neuropathy symptoms. No nausea or vomiting. He began capecitabine approximately 1-1/2 weeks later secondary to an insurance delay. No hand/foot pain or diarrhea. He complains of constipation. The constipation has not improved with once daily Colace or Dulcolax.  The left clavicle pain is now improved. He is taking OxyContin in the evening and ibuprofen during the day. He plans to leave for a vacation later this week.  Objective:  Vital signs in last 24 hours:  Blood pressure 95/61, pulse 77, temperature 97.4 F (36.3 C), temperature source Oral, resp. rate 17, height 6\' 1"  (1.854 m), weight 158 lb 14.4 oz (72.077 kg), SpO2 98.00%.    HEENT: No thrush or ulcers Lymphatics: 1/2 cm right scalene node Resp: Lungs clear bilaterally Cardio: Regular rate and rhythm GI: No hepatomegaly Vascular: No leg edema Skin: Palms without erythema  Portacath/PICC-without erythema  Lab Results:  Lab Results  Component Value Date   WBC 6.8 04/08/2014   HGB 13.1 04/08/2014   HCT 40.0 04/08/2014   MCV 92.3 04/08/2014   PLT 249 04/08/2014   NEUTROABS 4.2 04/08/2014     Lab Results  Component Value Date   CEA <0.5 03/12/2014    Imaging:  No results found.  Medications: I have reviewed the patient's current medications.  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  -Status post an esophagogastrectomy 04/20/2013 with the pathology confirming a pT1b,pN1 tumor, grade 1 with negative surgical margins.  2. History of Barrett's esophagus.  3. Adult onset diabetes.  4.  Odynophagia/early satiety. The odynophagia is better.  5. Postoperative hoarseness-status post collagen injection therapy at [facility].  6. Dysphagia progressive over the past 2-4 months. He is sheduled for a dilatation procedure in the near future.  7. Chest CT 02/14/2014 showed a new masslike partially calcified lesion in the right lower lobe peripherally. There were some new lower lobe reticulonodular opacities. A peripherally located right lower lobe superior segment nodule was larger.  PET scan 02/19/2014 with evidence of multiple hypermetabolic lung nodules, hypermetabolic bone lesions, and a hypermetabolic focus at the left side of the prostate  CT biopsy of a right lower lobe subpleural nodule 03/07/2014 confirmed metastatic adenocarcinoma consistent with esophagus cancer. Cycle 1 CAPOX beginning 03/18/2014 8. Port-A-Cath placement 03/11/2014  9.severe pain at the left clavicle after a fall, plain x-ray of the left clavicle 03/18/2014 confirmed a lytic lesion of the medial left clavicle, PET scan at [facility] 02/19/2014 revealed a hypermetabolic medial left clavicle lesion  10. Constipation-likely secondary to narcotics    Disposition:  Matthew White completed one cycle of CAPOX without significant acute toxicity. He will continue capecitabine. He will receive a second treatment with oxaliplatin today. Matthew White will return for an office visit and cycle 3 on 04/29/2014. We will schedule a restaging evaluation after cycle 3.  The constipation is most likely secondary to narcotics. He will begin MiraLAX daily and increase the Colace to twice daily. Matthew White will contact us if this does not relieve the constipation.  Ladell Pier, MD  04/08/2014  1:52 PM

## 2014-04-18 ENCOUNTER — Other Ambulatory Visit: Payer: Self-pay | Admitting: Oncology

## 2014-04-18 DIAGNOSIS — C159 Malignant neoplasm of esophagus, unspecified: Secondary | ICD-10-CM

## 2014-04-18 MED ORDER — OXYCODONE-ACETAMINOPHEN 5-325 MG PO TABS: 2.0000 | ORAL_TABLET | ORAL | Status: DC | PRN

## 2014-04-26 ENCOUNTER — Other Ambulatory Visit: Payer: Self-pay

## 2014-04-28 ENCOUNTER — Other Ambulatory Visit: Payer: Self-pay | Admitting: Oncology

## 2014-04-29 ENCOUNTER — Ambulatory Visit (HOSPITAL_BASED_OUTPATIENT_CLINIC_OR_DEPARTMENT_OTHER): Payer: Medicare PPO

## 2014-04-29 ENCOUNTER — Ambulatory Visit (HOSPITAL_COMMUNITY)
Admission: RE | Admit: 2014-04-29 | Discharge: 2014-04-29 | Disposition: A | Discharge status: Home / Self Care | Payer: Medicare PPO | Source: Ambulatory Visit | Attending: Nurse Practitioner | Admitting: Nurse Practitioner

## 2014-04-29 ENCOUNTER — Ambulatory Visit (HOSPITAL_BASED_OUTPATIENT_CLINIC_OR_DEPARTMENT_OTHER): Payer: Medicare PPO | Admitting: Nurse Practitioner

## 2014-04-29 ENCOUNTER — Other Ambulatory Visit (HOSPITAL_BASED_OUTPATIENT_CLINIC_OR_DEPARTMENT_OTHER): Payer: Medicare PPO

## 2014-04-29 VITALS — BP 87/59 | HR 82 | Temp 97.0°F | Resp 19 | Ht 73.0 in | Wt 155.4 lb

## 2014-04-29 DIAGNOSIS — C159 Malignant neoplasm of esophagus, unspecified: Secondary | ICD-10-CM

## 2014-04-29 DIAGNOSIS — C155 Malignant neoplasm of lower third of esophagus: Secondary | ICD-10-CM

## 2014-04-29 DIAGNOSIS — Z5111 Encounter for antineoplastic chemotherapy: Secondary | ICD-10-CM

## 2014-04-29 DIAGNOSIS — C782 Secondary malignant neoplasm of pleura: Secondary | ICD-10-CM

## 2014-04-29 DIAGNOSIS — M79609 Pain in unspecified limb: Secondary | ICD-10-CM | POA: Insufficient documentation

## 2014-04-29 DIAGNOSIS — R110 Nausea: Secondary | ICD-10-CM

## 2014-04-29 DIAGNOSIS — M25559 Pain in unspecified hip: Secondary | ICD-10-CM | POA: Insufficient documentation

## 2014-04-29 LAB — COMPREHENSIVE METABOLIC PANEL (CC13)
ALBUMIN: 3.2 g/dL — AB (ref 3.5–5.0)
ALK PHOS: 81 U/L (ref 40–150)
ALT: 7 U/L (ref 0–55)
AST: 14 U/L (ref 5–34)
Anion Gap: 10 mEq/L (ref 3–11)
BILIRUBIN TOTAL: 0.39 mg/dL (ref 0.20–1.20)
BUN: 23.9 mg/dL (ref 7.0–26.0)
CO2: 26 mEq/L (ref 22–29)
Calcium: 9.8 mg/dL (ref 8.4–10.4)
Chloride: 107 mEq/L (ref 98–109)
Creatinine: 1 mg/dL (ref 0.7–1.3)
Glucose: 102 mg/dl (ref 70–140)
POTASSIUM: 4.5 meq/L (ref 3.5–5.1)
SODIUM: 143 meq/L (ref 136–145)
TOTAL PROTEIN: 6.4 g/dL (ref 6.4–8.3)

## 2014-04-29 LAB — CBC WITH DIFFERENTIAL/PLATELET
BASO%: 1 % (ref 0.0–2.0)
Basophils Absolute: 0.1 10*3/uL (ref 0.0–0.1)
EOS%: 2.8 % (ref 0.0–7.0)
Eosinophils Absolute: 0.1 10*3/uL (ref 0.0–0.5)
HCT: 35.3 % — ABNORMAL LOW (ref 38.4–49.9)
HGB: 11.5 g/dL — ABNORMAL LOW (ref 13.0–17.1)
LYMPH%: 25.6 % (ref 14.0–49.0)
MCH: 30.7 pg (ref 27.2–33.4)
MCHC: 32.6 g/dL (ref 32.0–36.0)
MCV: 94.1 fL (ref 79.3–98.0)
MONO#: 0.8 10*3/uL (ref 0.1–0.9)
MONO%: 13.9 % (ref 0.0–14.0)
NEUT#: 3.1 10*3/uL (ref 1.5–6.5)
NEUT%: 56.7 % (ref 39.0–75.0)
PLATELETS: 196 10*3/uL (ref 140–400)
RBC: 3.75 10*6/uL — ABNORMAL LOW (ref 4.20–5.82)
RDW: 19.5 % — AB (ref 11.0–14.6)
WBC: 5.4 10*3/uL (ref 4.0–10.3)
lymph#: 1.4 10*3/uL (ref 0.9–3.3)

## 2014-04-29 MED ORDER — DEXAMETHASONE SODIUM PHOSPHATE 10 MG/ML IJ SOLN
INTRAMUSCULAR | Status: AC
  Filled 2014-04-29: qty 1

## 2014-04-29 MED ORDER — OXYCODONE-ACETAMINOPHEN 5-325 MG PO TABS: 2.0000 | ORAL_TABLET | ORAL | Status: DC | PRN

## 2014-04-29 MED ORDER — HEPARIN SOD (PORK) LOCK FLUSH 100 UNIT/ML IV SOLN
500.0000 [IU] | Freq: Once | INTRAVENOUS | Status: AC | PRN
  Administered 2014-04-29: 500 [IU]
  Filled 2014-04-29: qty 5

## 2014-04-29 MED ORDER — PALONOSETRON HCL INJECTION 0.25 MG/5ML
0.2500 mg | Freq: Once | INTRAVENOUS | Status: AC
  Administered 2014-04-29: 0.25 mg via INTRAVENOUS

## 2014-04-29 MED ORDER — OXYCODONE HCL ER 10 MG PO T12A: 10.0000 mg | EXTENDED_RELEASE_TABLET | Freq: Every evening | ORAL | Status: DC

## 2014-04-29 MED ORDER — DEXTROSE 5 % IV SOLN
Freq: Once | INTRAVENOUS | Status: AC
  Administered 2014-04-29: 13:00:00 via INTRAVENOUS

## 2014-04-29 MED ORDER — PALONOSETRON HCL INJECTION 0.25 MG/5ML
INTRAVENOUS | Status: AC
  Filled 2014-04-29: qty 5

## 2014-04-29 MED ORDER — DEXAMETHASONE SODIUM PHOSPHATE 10 MG/ML IJ SOLN
10.0000 mg | Freq: Once | INTRAMUSCULAR | Status: AC
  Administered 2014-04-29: 10 mg via INTRAVENOUS

## 2014-04-29 MED ORDER — OXALIPLATIN CHEMO INJECTION 100 MG/20ML
100.0000 mg/m2 | Freq: Once | INTRAVENOUS | Status: AC
  Administered 2014-04-29: 195 mg via INTRAVENOUS
  Filled 2014-04-29: qty 39

## 2014-04-29 MED ORDER — OXYCODONE HCL ER 10 MG PO T12A: 10.0000 mg | EXTENDED_RELEASE_TABLET | Freq: Two times a day (BID) | ORAL | Status: DC

## 2014-04-29 MED ORDER — SODIUM CHLORIDE 0.9 % IJ SOLN
10.0000 mL | INTRAMUSCULAR | Status: DC | PRN
  Administered 2014-04-29: 10 mL
  Filled 2014-04-29: qty 10

## 2014-04-29 NOTE — Progress Notes (Signed)
Accident OFFICE PROGRESS NOTE   Diagnosis:  Metastatic esophagus cancer.  INTERVAL HISTORY:   Matthew White returns as scheduled. He completed cycle 2 CAPOX beginning 04/08/2014. He had mild nausea beginning around day 3. The nausea lasted for 3-4 days. He took Compazine as needed. No diarrhea or mouth sores. Cold sensitivity lasted about 5 days. He denies persistent neuropathy symptoms. He has been intermittently constipated. He takes docusate twice daily and MiraLAX as needed. He reports right upper leg/hip pain for the past 3 weeks with weightbearing. The left clavicle pain is better.  Objective:  Vital signs in last 24 hours:  Blood pressure 87/59, pulse 82, temperature 97 F (36.1 C), temperature source Oral, resp. rate 19, height 6\' 1"  (1.854 m), weight 155 lb 6.4 oz (70.489 kg).    HEENT: No thrush or ulcerations. Lymphatics: 1/2 - 1 cm right scalene node. Resp: Lungs clear. Cardio: Regular cardiac rhythm. GI: Abdomen soft and nontender. No hepatomegaly. Vascular: No leg edema. Neuro: Vibratory sense mildly decreased over the fingertips per tuning fork exam.  Skin: No rash.  Port-A-Cath site is without erythema.    Lab Results:  Lab Results  Component Value Date   WBC 5.4 04/29/2014   HGB 11.5* 04/29/2014   HCT 35.3* 04/29/2014   MCV 94.1 04/29/2014   PLT 196 04/29/2014   NEUTROABS 3.1 04/29/2014    Imaging:  No results found.  Medications: I have reviewed the patient's current medications.  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  -Status post an esophagogastrectomy 04/20/2013 with the pathology confirming a pT1b,pN1 tumor, grade 1 with negative surgical margins.  2. History of Barrett's esophagus.  3. Adult onset diabetes.  4.  Odynophagia/early satiety. The odynophagia is better.  5. Postoperative hoarseness-status post collagen injection therapy at [facility].  6. Dysphagia progressive over the past 2-4 months. He is sheduled for a dilatation procedure in the near future.  7. Chest CT 02/14/2014 showed a new masslike partially calcified lesion in the right lower lobe peripherally. There were some new lower lobe reticulonodular opacities. A peripherally located right lower lobe superior segment nodule was larger.  PET scan 02/19/2014 with evidence of multiple hypermetabolic lung nodules, hypermetabolic bone lesions, and a hypermetabolic focus at the left side of the prostate  CT biopsy of a right lower lobe subpleural nodule 03/07/2014 confirmed metastatic adenocarcinoma consistent with esophagus cancer.  Cycle 1 CAPOX beginning 03/18/2014. Cycle 2 CAPOX beginning 04/08/2014. 8. Port-A-Cath placement 03/11/2014  9. Severe pain at the left clavicle after a fall, plain x-ray of the left clavicle 03/18/2014 confirmed a lytic lesion of the medial left clavicle, PET scan at [facility] 02/19/2014 revealed a hypermetabolic medial left clavicle lesion.  10. Constipation-likely secondary to narcotics. 11. Delayed nausea following cycle 2 CAPOX. Aloxi added beginning with cycle 3.  12. Right upper leg/hip pain with weightbearing.    Disposition: He appears stable. Plan to proceed with cycle 3 CAPOX today as scheduled. He had delayed nausea following cycle 2. Aloxi will be added with today's treatment.  He is experiencing pain at the right upper leg/hip with weightbearing. PET scan done at [facility] on 02/19/2014 showed a new hypermetabolic lytic lesion in the subtrochanteric right femur with SUV max of 13. We will obtain plain x-rays of the right hip and femur to evaluate for an impending fracture.  He will continue OxyContin with Percocet as needed for breakthrough pain.  We are referring him for restaging CT scans 05/17/2014. He will return for  a followup visit on 05/20/2014. He understands to contact the office in the interim with any problems.  Plan reviewed with Dr. Benay Spice.    Owens Shark ANP/GNP-BC   04/29/2014  4:41 PM

## 2014-04-29 NOTE — Patient Instructions (Signed)
[facility] Discharge Instructions for Patients Receiving Chemotherapy  Today you received the following chemotherapy agents: Oxaliplatin  To help prevent nausea and vomiting after your treatment, we encourage you to take your nausea medication as prescribed.    If you develop nausea and vomiting that is not controlled by your nausea medication, call the clinic.   BELOW ARE SYMPTOMS THAT SHOULD BE REPORTED IMMEDIATELY:  *FEVER GREATER THAN 100.5 F  *CHILLS WITH OR WITHOUT FEVER  NAUSEA AND VOMITING THAT IS NOT CONTROLLED WITH YOUR NAUSEA MEDICATION  *UNUSUAL SHORTNESS OF BREATH  *UNUSUAL BRUISING OR BLEEDING  TENDERNESS IN MOUTH AND THROAT WITH OR WITHOUT PRESENCE OF ULCERS  *URINARY PROBLEMS  *BOWEL PROBLEMS  UNUSUAL RASH Items with * indicate a potential emergency and should be followed up as soon as possible.  Feel free to call the clinic you have any questions or concerns. The clinic phone number is [phone].

## 2014-04-30 ENCOUNTER — Telehealth: Payer: Self-pay | Admitting: Oncology

## 2014-04-30 NOTE — Telephone Encounter (Signed)
lvm for pt regarding to June appt...mailed pt appt sched/avs and letter

## 2014-05-01 ENCOUNTER — Telehealth: Payer: Self-pay | Admitting: *Deleted

## 2014-05-01 ENCOUNTER — Encounter: Payer: Self-pay | Admitting: Oncology

## 2014-05-01 ENCOUNTER — Encounter: Payer: Self-pay | Admitting: *Deleted

## 2014-05-01 NOTE — Progress Notes (Signed)
Faxed oxycontin pa form to [facility]

## 2014-05-01 NOTE — Telephone Encounter (Signed)
Received message from pt's wife stating "we are going to cancel our trip to [facility]; please let Dr. Benay Spice know to go ahead with the ortho consult"  Dr. Benay Spice made aware.  Per MD; called and notified pt's wife that appts 05/02/14 with Dr. Sondra Come are cancelled and pt has appt tomorrow with Dr. Berenice Primas @ [place].  Pt wife verbalized understanding and expressed appreciation for call back.

## 2014-05-01 NOTE — Progress Notes (Signed)
Prior authorization request for Oxycontin 10 mg received from [place]. Not covered on patient's policy. Forwarded to managed care.

## 2014-05-01 NOTE — Progress Notes (Signed)
Faxed oxycontin er 10mg  pa form to [facility]

## 2014-05-02 ENCOUNTER — Other Ambulatory Visit: Payer: Self-pay | Admitting: Radiation Oncology

## 2014-05-02 ENCOUNTER — Inpatient Hospital Stay: Admission: RE | Admit: 2014-05-02 | Payer: Self-pay | Source: Ambulatory Visit | Admitting: Radiation Oncology

## 2014-05-02 ENCOUNTER — Other Ambulatory Visit: Payer: Self-pay | Admitting: Orthopedic Surgery

## 2014-05-02 ENCOUNTER — Telehealth: Payer: Self-pay | Admitting: Oncology

## 2014-05-02 ENCOUNTER — Encounter (HOSPITAL_COMMUNITY): Payer: Self-pay | Admitting: Pharmacy Technician

## 2014-05-02 ENCOUNTER — Ambulatory Visit: Admission: RE | Admit: 2014-05-02 | Payer: Medicare PPO | Source: Ambulatory Visit | Admitting: Radiation Oncology

## 2014-05-02 ENCOUNTER — Ambulatory Visit: Payer: Medicare PPO

## 2014-05-02 ENCOUNTER — Encounter: Payer: Self-pay | Admitting: Nurse Practitioner

## 2014-05-02 ENCOUNTER — Encounter (HOSPITAL_COMMUNITY): Payer: Self-pay | Admitting: *Deleted

## 2014-05-02 NOTE — Telephone Encounter (Signed)
Called and left a message stating Matthew White's appointment for [facility] has been canceled for tomorrow.

## 2014-05-02 NOTE — Progress Notes (Signed)
[employer] approved oxycontin 10mg  from 05/01/14-12/12/14

## 2014-05-03 ENCOUNTER — Ambulatory Visit (HOSPITAL_COMMUNITY): Payer: Medicare PPO | Admitting: Certified Registered Nurse Anesthetist

## 2014-05-03 ENCOUNTER — Encounter (HOSPITAL_COMMUNITY)
Admission: RE | Disposition: A | Discharge status: Home / Self Care | Payer: Self-pay | Source: Ambulatory Visit | Attending: Orthopedic Surgery

## 2014-05-03 ENCOUNTER — Encounter (HOSPITAL_COMMUNITY): Payer: Medicare PPO | Admitting: Certified Registered Nurse Anesthetist

## 2014-05-03 ENCOUNTER — Other Ambulatory Visit: Payer: Self-pay | Admitting: *Deleted

## 2014-05-03 ENCOUNTER — Ambulatory Visit: Payer: Medicare PPO | Admitting: Radiation Oncology

## 2014-05-03 ENCOUNTER — Ambulatory Visit (HOSPITAL_COMMUNITY): Payer: Medicare PPO

## 2014-05-03 ENCOUNTER — Observation Stay (HOSPITAL_COMMUNITY)
Admission: RE | Admit: 2014-05-03 | Discharge: 2014-05-04 | Disposition: A | Discharge status: Home / Self Care | Payer: Medicare PPO | Source: Ambulatory Visit | Attending: Orthopedic Surgery | Admitting: Orthopedic Surgery

## 2014-05-03 ENCOUNTER — Encounter (HOSPITAL_COMMUNITY): Payer: Self-pay | Admitting: Certified Registered Nurse Anesthetist

## 2014-05-03 DIAGNOSIS — C7952 Secondary malignant neoplasm of bone marrow: Secondary | ICD-10-CM

## 2014-05-03 DIAGNOSIS — Z794 Long term (current) use of insulin: Secondary | ICD-10-CM | POA: Insufficient documentation

## 2014-05-03 DIAGNOSIS — C7800 Secondary malignant neoplasm of unspecified lung: Secondary | ICD-10-CM | POA: Insufficient documentation

## 2014-05-03 DIAGNOSIS — C7951 Secondary malignant neoplasm of bone: Secondary | ICD-10-CM | POA: Insufficient documentation

## 2014-05-03 DIAGNOSIS — E119 Type 2 diabetes mellitus without complications: Secondary | ICD-10-CM | POA: Insufficient documentation

## 2014-05-03 DIAGNOSIS — K2270 Barrett's esophagus without dysplasia: Secondary | ICD-10-CM | POA: Insufficient documentation

## 2014-05-03 DIAGNOSIS — C159 Malignant neoplasm of esophagus, unspecified: Secondary | ICD-10-CM

## 2014-05-03 DIAGNOSIS — Z87891 Personal history of nicotine dependence: Secondary | ICD-10-CM | POA: Insufficient documentation

## 2014-05-03 DIAGNOSIS — M84551A Pathological fracture in neoplastic disease, right femur, initial encounter for fracture: Secondary | ICD-10-CM

## 2014-05-03 DIAGNOSIS — K219 Gastro-esophageal reflux disease without esophagitis: Secondary | ICD-10-CM | POA: Insufficient documentation

## 2014-05-03 DIAGNOSIS — M84453A Pathological fracture, unspecified femur, initial encounter for fracture: Principal | ICD-10-CM | POA: Insufficient documentation

## 2014-05-03 HISTORY — PX: FEMUR IM NAIL: SHX1597

## 2014-05-03 LAB — BASIC METABOLIC PANEL
BUN: 27 mg/dL — ABNORMAL HIGH (ref 6–23)
CO2: 29 mEq/L (ref 19–32)
Calcium: 9.8 mg/dL (ref 8.4–10.5)
Chloride: 102 mEq/L (ref 96–112)
Creatinine, Ser: 0.85 mg/dL (ref 0.50–1.35)
GFR calc Af Amer: >90 mL/min (ref >90)
GFR calc non Af Amer: 82 mL/min — ABNORMAL LOW (ref >90)
Glucose, Bld: 173 mg/dL — ABNORMAL HIGH (ref 70–99)
Potassium: 5 mEq/L (ref 3.7–5.3)
SODIUM: 141 meq/L (ref 137–147)

## 2014-05-03 LAB — CBC
HEMATOCRIT: 36.8 % — AB (ref 39.0–52.0)
HEMOGLOBIN: 12.1 g/dL — AB (ref 13.0–17.0)
MCH: 31.3 pg (ref 26.0–34.0)
MCHC: 32.9 g/dL (ref 30.0–36.0)
MCV: 95.1 fL (ref 78.0–100.0)
Platelets: 178 10*3/uL (ref 150–400)
RBC: 3.87 MIL/uL — ABNORMAL LOW (ref 4.22–5.81)
RDW: 18.1 % — ABNORMAL HIGH (ref 11.5–15.5)
WBC: 10.9 10*3/uL — ABNORMAL HIGH (ref 4.0–10.5)

## 2014-05-03 LAB — URINALYSIS, ROUTINE W REFLEX MICROSCOPIC
BILIRUBIN URINE: NEGATIVE
Glucose, UA: NEGATIVE mg/dL
Hgb urine dipstick: NEGATIVE
Ketones, ur: 15 mg/dL — AB
Leukocytes, UA: NEGATIVE
NITRITE: NEGATIVE
PH: 5 (ref 5.0–8.0)
Protein, ur: NEGATIVE mg/dL
Specific Gravity, Urine: 1.027 (ref 1.005–1.030)
UROBILINOGEN UA: 0.2 mg/dL (ref 0.0–1.0)

## 2014-05-03 LAB — COMPREHENSIVE METABOLIC PANEL
ALBUMIN: 3.4 g/dL — AB (ref 3.5–5.2)
ALT: 7 U/L (ref 0–53)
AST: 17 U/L (ref 0–37)
Alkaline Phosphatase: 91 U/L (ref 39–117)
BUN: 27 mg/dL — AB (ref 6–23)
CO2: 26 mEq/L (ref 19–32)
CREATININE: 0.9 mg/dL (ref 0.50–1.35)
Calcium: 9.7 mg/dL (ref 8.4–10.5)
Chloride: 100 mEq/L (ref 96–112)
GFR calc Af Amer: >90 mL/min (ref >90)
GFR calc non Af Amer: 80 mL/min — ABNORMAL LOW (ref >90)
Glucose, Bld: 128 mg/dL — ABNORMAL HIGH (ref 70–99)
Potassium: 4.3 mEq/L (ref 3.7–5.3)
Sodium: 138 mEq/L (ref 137–147)
TOTAL PROTEIN: 6.8 g/dL (ref 6.0–8.3)
Total Bilirubin: 0.5 mg/dL (ref 0.3–1.2)

## 2014-05-03 LAB — GLUCOSE, CAPILLARY
Glucose-Capillary: 103 mg/dL — ABNORMAL HIGH (ref 70–99)
Glucose-Capillary: 75 mg/dL (ref 70–99)

## 2014-05-03 LAB — CBC WITH DIFFERENTIAL/PLATELET
BASOS PCT: 0 % (ref 0–1)
Basophils Absolute: 0 10*3/uL (ref 0.0–0.1)
EOS ABS: 0.1 10*3/uL (ref 0.0–0.7)
Eosinophils Relative: 2 % (ref 0–5)
HEMATOCRIT: 36.8 % — AB (ref 39.0–52.0)
HEMOGLOBIN: 12.1 g/dL — AB (ref 13.0–17.0)
Lymphocytes Relative: 16 % (ref 12–46)
Lymphs Abs: 1 10*3/uL (ref 0.7–4.0)
MCH: 31 pg (ref 26.0–34.0)
MCHC: 32.9 g/dL (ref 30.0–36.0)
MCV: 94.4 fL (ref 78.0–100.0)
MONO ABS: 0.8 10*3/uL (ref 0.1–1.0)
MONOS PCT: 14 % — AB (ref 3–12)
NEUTROS PCT: 68 % (ref 43–77)
Neutro Abs: 4.2 10*3/uL (ref 1.7–7.7)
Platelets: 181 10*3/uL (ref 150–400)
RBC: 3.9 MIL/uL — ABNORMAL LOW (ref 4.22–5.81)
RDW: 18.2 % — ABNORMAL HIGH (ref 11.5–15.5)
WBC: 6 10*3/uL (ref 4.0–10.5)

## 2014-05-03 LAB — APTT: aPTT: 29 seconds (ref 24–37)

## 2014-05-03 LAB — PROTIME-INR
INR: 1.04 (ref 0.00–1.49)
PROTHROMBIN TIME: 13.4 s (ref 11.6–15.2)

## 2014-05-03 LAB — TYPE AND SCREEN
ABO/RH(D): A NEG
ANTIBODY SCREEN: NEGATIVE

## 2014-05-03 LAB — ABO/RH: ABO/RH(D): A NEG

## 2014-05-03 SURGERY — INSERTION, INTRAMEDULLARY ROD, FEMUR
Anesthesia: Monitor Anesthesia Care | Laterality: Right

## 2014-05-03 MED ORDER — MORPHINE SULFATE 2 MG/ML IJ SOLN
1.0000 mg | INTRAMUSCULAR | Status: DC | PRN
  Administered 2014-05-03 – 2014-05-04 (×2): 2 mg via INTRAVENOUS
  Filled 2014-05-03 (×2): qty 1

## 2014-05-03 MED ORDER — POLYETHYLENE GLYCOL 3350 17 G PO PACK: 17.0000 g | PACK | Freq: Every day | ORAL | Status: DC | PRN

## 2014-05-03 MED ORDER — ONDANSETRON HCL 4 MG/2ML IJ SOLN: 4.0000 mg | Freq: Once | INTRAMUSCULAR | Status: DC | PRN

## 2014-05-03 MED ORDER — LACTATED RINGERS IV SOLN
INTRAVENOUS | Status: DC
  Administered 2014-05-03: 50 mL/h via INTRAVENOUS

## 2014-05-03 MED ORDER — MIDAZOLAM HCL 5 MG/5ML IJ SOLN
INTRAMUSCULAR | Status: DC | PRN
  Administered 2014-05-03: 2 mg via INTRAVENOUS

## 2014-05-03 MED ORDER — ONDANSETRON HCL 4 MG PO TABS: 4.0000 mg | ORAL_TABLET | Freq: Four times a day (QID) | ORAL | Status: DC | PRN

## 2014-05-03 MED ORDER — BUPIVACAINE HCL (PF) 0.5 % IJ SOLN
INTRAMUSCULAR | Status: AC
  Filled 2014-05-03: qty 10

## 2014-05-03 MED ORDER — EPHEDRINE SULFATE 50 MG/ML IJ SOLN
INTRAMUSCULAR | Status: DC | PRN
  Administered 2014-05-03 (×2): 10 mg via INTRAVENOUS

## 2014-05-03 MED ORDER — BUPIVACAINE HCL (PF) 0.5 % IJ SOLN
INTRAMUSCULAR | Status: DC | PRN
  Administered 2014-05-03: 30 mL

## 2014-05-03 MED ORDER — FENTANYL CITRATE 0.05 MG/ML IJ SOLN
INTRAMUSCULAR | Status: AC
  Filled 2014-05-03: qty 5

## 2014-05-03 MED ORDER — PANTOPRAZOLE SODIUM 40 MG PO TBEC
40.0000 mg | DELAYED_RELEASE_TABLET | Freq: Every day | ORAL | Status: DC
  Filled 2014-05-03: qty 1

## 2014-05-03 MED ORDER — ONDANSETRON HCL 4 MG/2ML IJ SOLN
INTRAMUSCULAR | Status: AC
  Filled 2014-05-03: qty 2

## 2014-05-03 MED ORDER — BUPIVACAINE HCL (PF) 0.75 % IJ SOLN
INTRAMUSCULAR | Status: DC | PRN
  Administered 2014-05-03: 1.4 mL via INTRATHECAL

## 2014-05-03 MED ORDER — ALPRAZOLAM 0.5 MG PO TABS: 0.5000 mg | ORAL_TABLET | Freq: Every evening | ORAL | Status: DC | PRN

## 2014-05-03 MED ORDER — CEFAZOLIN SODIUM-DEXTROSE 2-3 GM-% IV SOLR
2.0000 g | Freq: Four times a day (QID) | INTRAVENOUS | Status: AC
  Administered 2014-05-03 – 2014-05-04 (×2): 2 g via INTRAVENOUS
  Filled 2014-05-03 (×2): qty 50

## 2014-05-03 MED ORDER — OXYCODONE-ACETAMINOPHEN 5-325 MG PO TABS
2.0000 | ORAL_TABLET | ORAL | Status: DC | PRN
  Administered 2014-05-03 – 2014-05-04 (×3): 2 via ORAL
  Filled 2014-05-03 (×3): qty 2

## 2014-05-03 MED ORDER — DOCUSATE SODIUM 100 MG PO CAPS
100.0000 mg | ORAL_CAPSULE | Freq: Two times a day (BID) | ORAL | Status: DC
  Administered 2014-05-03 – 2014-05-04 (×2): 100 mg via ORAL
  Filled 2014-05-03 (×3): qty 1

## 2014-05-03 MED ORDER — SUCCINYLCHOLINE CHLORIDE 20 MG/ML IJ SOLN
INTRAMUSCULAR | Status: AC
  Filled 2014-05-03: qty 1

## 2014-05-03 MED ORDER — SODIUM CHLORIDE 0.9 % IV SOLN
INTRAVENOUS | Status: DC
Start: 2014-05-03 — End: 2014-05-04
  Administered 2014-05-03 – 2014-05-04 (×2): via INTRAVENOUS

## 2014-05-03 MED ORDER — EPHEDRINE SULFATE 50 MG/ML IJ SOLN
INTRAMUSCULAR | Status: AC
  Filled 2014-05-03: qty 1

## 2014-05-03 MED ORDER — CAPECITABINE 500 MG PO TABS: 1000.0000 mg | ORAL_TABLET | Freq: Every day | ORAL | Status: DC

## 2014-05-03 MED ORDER — CAPECITABINE 500 MG PO TABS: 1500.0000 mg | ORAL_TABLET | Freq: Every day | ORAL | Status: DC

## 2014-05-03 MED ORDER — METHOCARBAMOL 1000 MG/10ML IJ SOLN: 500.0000 mg | Freq: Four times a day (QID) | INTRAVENOUS | Status: DC | PRN

## 2014-05-03 MED ORDER — CEFAZOLIN SODIUM-DEXTROSE 2-3 GM-% IV SOLR
2.0000 g | INTRAVENOUS | Status: AC
  Administered 2014-05-03: 2 g via INTRAVENOUS

## 2014-05-03 MED ORDER — FENTANYL CITRATE 0.05 MG/ML IJ SOLN
INTRAMUSCULAR | Status: DC | PRN
  Administered 2014-05-03 (×3): 50 ug via INTRAVENOUS

## 2014-05-03 MED ORDER — PROPOFOL 10 MG/ML IV BOLUS
INTRAVENOUS | Status: AC
  Filled 2014-05-03: qty 20

## 2014-05-03 MED ORDER — CHLORHEXIDINE GLUCONATE 4 % EX LIQD: 60.0000 mL | Freq: Once | CUTANEOUS | Status: DC

## 2014-05-03 MED ORDER — METHOCARBAMOL 500 MG PO TABS
500.0000 mg | ORAL_TABLET | Freq: Four times a day (QID) | ORAL | Status: DC | PRN
  Administered 2014-05-03 – 2014-05-04 (×2): 500 mg via ORAL
  Filled 2014-05-03 (×2): qty 1

## 2014-05-03 MED ORDER — ASPIRIN EC 325 MG PO TBEC
325.0000 mg | DELAYED_RELEASE_TABLET | Freq: Two times a day (BID) | ORAL | Status: DC
  Administered 2014-05-04: 325 mg via ORAL
  Filled 2014-05-03 (×4): qty 1

## 2014-05-03 MED ORDER — SODIUM CHLORIDE 0.9 % IJ SOLN
INTRAMUSCULAR | Status: AC
  Filled 2014-05-03: qty 10

## 2014-05-03 MED ORDER — LACTATED RINGERS IV SOLN
INTRAVENOUS | Status: DC | PRN
  Administered 2014-05-03: 13:00:00 via INTRAVENOUS

## 2014-05-03 MED ORDER — OXYCODONE HCL ER 10 MG PO T12A
10.0000 mg | EXTENDED_RELEASE_TABLET | Freq: Two times a day (BID) | ORAL | Status: DC
  Administered 2014-05-03: 10 mg via ORAL
  Filled 2014-05-03: qty 1

## 2014-05-03 MED ORDER — ASPIRIN EC 325 MG PO TBEC: 325.0000 mg | DELAYED_RELEASE_TABLET | Freq: Two times a day (BID) | ORAL | Status: DC

## 2014-05-03 MED ORDER — ONDANSETRON HCL 4 MG/2ML IJ SOLN
4.0000 mg | Freq: Four times a day (QID) | INTRAMUSCULAR | Status: DC | PRN
  Administered 2014-05-03: 4 mg via INTRAVENOUS
  Filled 2014-05-03: qty 2

## 2014-05-03 MED ORDER — ROCURONIUM BROMIDE 50 MG/5ML IV SOLN
INTRAVENOUS | Status: AC
  Filled 2014-05-03: qty 1

## 2014-05-03 MED ORDER — LIDOCAINE HCL (CARDIAC) 20 MG/ML IV SOLN
INTRAVENOUS | Status: AC
  Filled 2014-05-03: qty 5

## 2014-05-03 MED ORDER — MIDAZOLAM HCL 2 MG/2ML IJ SOLN
INTRAMUSCULAR | Status: AC
  Filled 2014-05-03: qty 2

## 2014-05-03 MED ORDER — CAPECITABINE 500 MG PO TABS: 1000.0000 mg | ORAL_TABLET | Freq: Two times a day (BID) | ORAL | Status: DC

## 2014-05-03 MED ORDER — FENTANYL CITRATE 0.05 MG/ML IJ SOLN: 25.0000 ug | INTRAMUSCULAR | Status: DC | PRN

## 2014-05-03 MED ORDER — PHENYLEPHRINE 40 MCG/ML (10ML) SYRINGE FOR IV PUSH (FOR BLOOD PRESSURE SUPPORT)
PREFILLED_SYRINGE | INTRAVENOUS | Status: AC
Start: 2014-05-03 — End: 2014-05-03
  Filled 2014-05-03: qty 10

## 2014-05-03 SURGICAL SUPPLY — 43 items
BIT DRILL 4.3MMS DISTAL GRDTED (BIT) IMPLANT
BLADE SURG ROTATE 9660 (MISCELLANEOUS) ×3 IMPLANT
COVER MAYO STAND STRL (DRAPES) ×2 IMPLANT
COVER PERINEAL POST (MISCELLANEOUS) ×3 IMPLANT
COVER SURGICAL LIGHT HANDLE (MISCELLANEOUS) ×4 IMPLANT
DECANTER SPIKE VIAL GLASS SM (MISCELLANEOUS) ×3 IMPLANT
DRAPE STERI IOBAN 125X83 (DRAPES) ×3 IMPLANT
DRILL 4.3MMS DISTAL GRADUATED (BIT) ×3
DRSG MEPILEX BORDER 4X4 (GAUZE/BANDAGES/DRESSINGS) ×3 IMPLANT
DRSG MEPILEX BORDER 4X8 (GAUZE/BANDAGES/DRESSINGS) ×3 IMPLANT
DURAPREP 26ML APPLICATOR (WOUND CARE) ×3 IMPLANT
ELECT CAUTERY BLADE 6.4 (BLADE) ×3 IMPLANT
ELECT REM PT RETURN 9FT ADLT (ELECTROSURGICAL) ×3
ELECTRODE REM PT RTRN 9FT ADLT (ELECTROSURGICAL) ×1 IMPLANT
EVACUATOR 1/8 PVC DRAIN (DRAIN) IMPLANT
GAUZE XEROFORM 5X9 LF (GAUZE/BANDAGES/DRESSINGS) ×3 IMPLANT
GLOVE BIOGEL PI IND STRL 8 (GLOVE) ×2 IMPLANT
GLOVE BIOGEL PI INDICATOR 8 (GLOVE) ×4
GLOVE ECLIPSE 7.5 STRL STRAW (GLOVE) ×6 IMPLANT
GOWN STRL REUS W/ TWL LRG LVL3 (GOWN DISPOSABLE) ×1 IMPLANT
GOWN STRL REUS W/ TWL XL LVL3 (GOWN DISPOSABLE) ×2 IMPLANT
GOWN STRL REUS W/TWL LRG LVL3 (GOWN DISPOSABLE) ×3
GOWN STRL REUS W/TWL XL LVL3 (GOWN DISPOSABLE) ×6
GUIDEWIRE BALL NOSE 80CM (WIRE) ×2 IMPLANT
HIP FRAC NAIL LAG SCR 10.5X100 (Orthopedic Implant) ×2 IMPLANT
KIT BASIN OR (CUSTOM PROCEDURE TRAY) ×3 IMPLANT
KIT ROOM TURNOVER OR (KITS) ×3 IMPLANT
LINER BOOT UNIVERSAL DISP (MISCELLANEOUS) ×3 IMPLANT
MANIFOLD NEPTUNE II (INSTRUMENTS) ×1 IMPLANT
NAIL HIP FRAC RT 130 11MX400M (Nail) ×2 IMPLANT
NS IRRIG 1000ML POUR BTL (IV SOLUTION) ×3 IMPLANT
PACK GENERAL/GYN (CUSTOM PROCEDURE TRAY) ×3 IMPLANT
PAD ARMBOARD 7.5X6 YLW CONV (MISCELLANEOUS) ×6 IMPLANT
SCREW BONE CORTICAL 5.0X44 (Screw) ×2 IMPLANT
SCREW CANN THRD AFF 10.5X100 (Orthopedic Implant) IMPLANT
STAPLER VISISTAT 35W (STAPLE) ×3 IMPLANT
SUT VIC AB 0 CTB1 27 (SUTURE) ×6 IMPLANT
SUT VIC AB 1 CTB1 27 (SUTURE) ×3 IMPLANT
SUT VIC AB 2-0 CTB1 (SUTURE) ×3 IMPLANT
SYR CONTROL 10ML LL (SYRINGE) ×2 IMPLANT
TOWEL OR 17X24 6PK STRL BLUE (TOWEL DISPOSABLE) ×5 IMPLANT
TOWEL OR 17X26 10 PK STRL BLUE (TOWEL DISPOSABLE) ×3 IMPLANT
WATER STERILE IRR 1000ML POUR (IV SOLUTION) ×1 IMPLANT

## 2014-05-03 NOTE — Brief Op Note (Signed)
05/03/2014  3:34 PM  PATIENT:  Matthew White  80 y.o. male  PRE-OPERATIVE DIAGNOSIS:  impending fracture right femur  POST-OPERATIVE DIAGNOSIS:  same  PROCEDURE:  Procedure(s): INTRAMEDULLARY (IM) NAIL FEMORAL (Right)  SURGEON:  Surgeon(s) and Role:    * Alta Corning, MD - Primary  PHYSICIAN ASSISTANT:   ASSISTANTS: bethune   ANESTHESIA:   general  EBL:  Total I/O In: 900 [I.V.:900] Out: -   BLOOD ADMINISTERED:none  DRAINS: none   LOCAL MEDICATIONS USED:  MARCAINE     SPECIMEN:  No Specimen  DISPOSITION OF SPECIMEN:  N/A  COUNTS:  YES  TOURNIQUET:  * No tourniquets in log *  DICTATION: .Other Dictation: Dictation Number [id number]  PLAN OF CARE: Admit to inpatient   PATIENT DISPOSITION:  PACU - hemodynamically stable.   Delay start of Pharmacological VTE agent (>24hrs) due to surgical blood loss or risk of bleeding: no

## 2014-05-03 NOTE — H&P (Signed)
PREOPERATIVE H&P  Chief Complaint: r leg impending fracture  HPI: Matthew White is a 80 y.o. male who presents for evaluation of r leg pain. It has been present for 3 months and has been worsening. He has an impending fracture.   He has failed conservative measures. Pain is rated as moderate.  Past Medical History  Diagnosis Date  . Gout     no flare up in ten years   . Barrett esophagus   . Esophageal cancer 12/22/2012    invasive adenocarcinoma arising in a background of barrett's mucosa  . Diabetes mellitus without complication     type II; last A1C 6.5 10/2012  . GERD (gastroesophageal reflux disease)     managed by prilosec  . Hx of pleural effusion 10/2013    left. pt states it was drained at [facility].    Past Surgical History  Procedure Laterality Date  . Nissen fundoplication    . Tonsillectomy    . Laryngoscopy    . Esophagogastroduodenoscopy  10/18/2012    Procedure: ESOPHAGOGASTRODUODENOSCOPY (EGD);  Surgeon: Winfield Cunas., MD;  Location: [facility];  Service: Endoscopy;  Laterality: N/A;  need xray  . Savory dilation  10/18/2012    Procedure: SAVORY DILATION;  Surgeon: Winfield Cunas., MD;  Location: [facility];  Service: Endoscopy;  Laterality: N/A;  . Esophagogastroduodenoscopy  12/22/2012    Procedure: ESOPHAGOGASTRODUODENOSCOPY (EGD);  Surgeon: Winfield Cunas., MD;  Location: [facility];  Service: Endoscopy;  Laterality: N/A;  with C-Arm  . Savory dilation  12/22/2012    Procedure: SAVORY DILATION;  Surgeon: Winfield Cunas., MD;  Location: [facility];  Service: Endoscopy;  Laterality: N/A;  . Eus  01/10/2013    Procedure: ESOPHAGEAL ENDOSCOPIC ULTRASOUND (EUS) RADIAL;  Surgeon: Arta Silence, MD;  Location: [facility];  Service: Endoscopy;  Laterality: N/A;  . Removal of breast tissue    . Esophagectomy  04/20/2013  . Larynx surgery  11/2013    LARYNGEAL REPAIR   . Cholecystectomy     History   Social History  . Marital Status: Married     Spouse Name: [name]    Number of Children: 4  . Years of Education: N/A   Occupational History  . retired    Social History Main Topics  . Smoking status: Former Smoker    Start date: 12/13/1954    Quit date: 10/19/1983  . Smokeless tobacco: Never Used  . Alcohol Use: No     Comment: stopped drinking in 1985  . Drug Use: No  . Sexual Activity: None   Other Topics Concern  . None   Social History Narrative   Retired from Restaurant manager, fast food   Married to [name] (retired Marine scientist)   #4 children and have gran children and great grandchildren   Family History  Problem Relation Age of Onset  . Cancer Father     pancreatic cancer; questionable miss dx  . Dementia Father   . Cancer Mother     cervical   Allergies  Allergen Reactions  . Ivp Dye [Iodinated Diagnostic Agents] Swelling    13 hr prep/reaction was 20 years ago per patient   Prior to Admission medications   Medication Sig Start Date End Date Taking? Authorizing Provider  ALPRAZolam Duanne Moron) 1 MG tablet Take 0.5 mg by mouth at bedtime as needed for sleep.   Yes Historical Provider, MD  capecitabine (XELODA) 500 MG tablet Take 1,000-1,500 mg by mouth 2 (two) times daily.  Takes 1,500mg  every morning and 1,000mg  every evening.   Yes Historical Provider, MD  docusate sodium (COLACE) 100 MG capsule Take 100 mg by mouth 2 (two) times daily.   Yes Historical Provider, MD  ENSURE (ENSURE) Take 237 mLs by mouth 4 (four) times daily as needed (for supplement.  Will use Boost or Ensure.).    Yes Historical Provider, MD  ibuprofen (ADVIL,MOTRIN) 200 MG tablet Take 200 mg by mouth 2 (two) times daily as needed for mild pain.    Yes Historical Provider, MD  insulin glargine (LANTUS) 100 UNIT/ML injection Inject 10 Units into the skin at bedtime.   Yes Historical Provider, MD  insulin lispro (HUMALOG) 100 UNIT/ML injection Inject 0-8 Units into the skin every evening. Patient uses a sliding scale from 0-8 units   Yes  Historical Provider, MD  lactose free nutrition (BOOST) LIQD Take 237 mLs by mouth 3 (three) times daily between meals.   Yes Historical Provider, MD  lidocaine-prilocaine (EMLA) cream Apply 1 application topically as needed.   Yes Historical Provider, MD  Omega-3 Fatty Acids (FISH OIL PO) Take 1 capsule by mouth 2 (two) times daily.   Yes Historical Provider, MD  omeprazole (PRILOSEC) 20 MG capsule Take 20 mg by mouth daily as needed (for acid reflux).    Yes Historical Provider, MD  OxyCODONE (OXYCONTIN) 10 mg T12A 12 hr tablet Take 1 tablet (10 mg total) by mouth every 12 (twelve) hours. 04/29/14  Yes Owens Shark, NP  oxyCODONE-acetaminophen (PERCOCET/ROXICET) 5-325 MG per tablet Take 2 tablets by mouth every 4 (four) hours as needed for severe pain. 04/29/14  Yes Owens Shark, NP  polyethylene glycol (MIRALAX / [place]) packet Take 17 g by mouth daily as needed for mild constipation.    Yes Historical Provider, MD  prochlorperazine (COMPAZINE) 5 MG tablet Take 1 tablet (5 mg total) by mouth every 6 (six) hours as needed for nausea or vomiting. 03/12/14  Yes Ladell Pier, MD  Vitamins A & D (VITAMIN A & D) ointment Apply 1 application topically at bedtime. Applied to buttocks   Yes Historical Provider, MD  acetaminophen (TYLENOL) 325 MG tablet Take 650 mg by mouth every 4 (four) hours as needed for mild pain.     Historical Provider, MD     Positive ROS: none  All other systems have been reviewed and were otherwise negative with the exception of those mentioned in the HPI and as above.  Physical Exam: Filed Vitals:   05/03/14 1128  BP: 98/65  Pulse: 90  Temp: 98.5 F (36.9 C)  Resp: 18    General: Alert, no acute distress Cardiovascular: No pedal edema Respiratory: No cyanosis, no use of accessory musculature GI: No organomegaly, abdomen is soft and non-tender Skin: No lesions in the area of chief complaint Neurologic: Sensation intact distally Psychiatric: Patient is  competent for consent with normal mood and affect Lymphatic: No axillary or cervical lymphadenopathy  MUSCULOSKELETAL: painful rom X-RAY: large lytic lesion r prox femur  Assessment/Plan: impending fracture right femur Plan for Procedure(s): INTRAMEDULLARY (IM) NAIL FEMORAL  The risks benefits and alternatives were discussed with the patient including but not limited to the risks of nonoperative treatment, versus surgical intervention including infection, bleeding, nerve injury, malunion, nonunion, hardware prominence, hardware failure, need for hardware removal, blood clots, cardiopulmonary complications, morbidity, mortality, among others, and they were willing to proceed.  Predicted outcome is good, although there will be at least a six to nine month expected recovery.  Alta Corning, MD 05/03/2014 1:43 PM

## 2014-05-03 NOTE — Transfer of Care (Signed)
Immediate Anesthesia Transfer of Care Note  Patient: Matthew White  Procedure(s) Performed: Procedure(s): INTRAMEDULLARY (IM) NAIL FEMORAL (Right)  Patient Location: PACU  Anesthesia Type:Spinal  Level of Consciousness: awake, alert , oriented and patient cooperative  Airway & Oxygen Therapy: Patient Spontanous Breathing and Patient connected to nasal cannula oxygen  Post-op Assessment: Report given to PACU RN and Post -op Vital signs reviewed and stable  Post vital signs: Reviewed and stable  Complications: No apparent anesthesia complications

## 2014-05-03 NOTE — Discharge Instructions (Signed)
Ambulate WGT BEARING as Tolerated on your right leg with a walker

## 2014-05-03 NOTE — Anesthesia Preprocedure Evaluation (Addendum)
Anesthesia Evaluation  Patient identified by MRN, date of birth, ID band Patient awake    Reviewed: Allergy & Precautions, H&P , NPO status , Patient's Chart, lab work & pertinent test results  Airway Mallampati: I TM Distance: >3 FB Neck ROM: full    Dental no notable dental hx. (+) Teeth Intact, Dental Advisory Given   Pulmonary neg pulmonary ROS, former smoker,  breath sounds clear to auscultation  Pulmonary exam normal       Cardiovascular negative cardio ROS  Rhythm:Regular Rate:Normal     Neuro/Psych negative neurological ROS  negative psych ROS   GI/Hepatic Neg liver ROS, GERD-  Medicated and Controlled,H/o esophageal CA with mets to the lung and bone   Endo/Other  diabetes, Well Controlled, Type 1, Insulin Dependent  Renal/GU negative Renal ROS  negative genitourinary   Musculoskeletal   Abdominal   Peds  Hematology negative hematology ROS (+)   Anesthesia Other Findings   Reproductive/Obstetrics negative OB ROS                          Anesthesia Physical Anesthesia Plan  ASA: III  Anesthesia Plan: Spinal and MAC   Post-op Pain Management:    Induction: Intravenous  Airway Management Planned: Oral ETT and Simple Face Mask  Additional Equipment:   Intra-op Plan:   Post-operative Plan:   Informed Consent: I have reviewed the patients History and Physical, chart, labs and discussed the procedure including the risks, benefits and alternatives for the proposed anesthesia with the patient or authorized representative who has indicated his/her understanding and acceptance.   Dental advisory given  Plan Discussed with: CRNA, Anesthesiologist and Surgeon  Anesthesia Plan Comments:        Anesthesia Quick Evaluation

## 2014-05-03 NOTE — Progress Notes (Signed)
Orthopedic Tech Progress Note Patient Details:  Matthew White 1934-01-09 [id number]  Ortho Devices Ortho Device/Splint Location: put overhead frame on bed Ortho Device/Splint Interventions: Ordered;Adjustment;Application   Braulio Bosch 05/03/2014, 8:35 PM

## 2014-05-03 NOTE — Progress Notes (Signed)
Spoke with Dr. Tobias Alexander and nurse for Dr. Berenice Primas and both are okay with using 1 view cxr for surgery from march.

## 2014-05-03 NOTE — Telephone Encounter (Signed)
THIS REFILL REQUEST FOR CAPECITABINE WAS PLACED ON DR.SHERRILL'S DESK. 

## 2014-05-03 NOTE — Anesthesia Postprocedure Evaluation (Signed)
  Anesthesia Post-op Note  Patient: Matthew White  Procedure(s) Performed: Procedure(s): INTRAMEDULLARY (IM) NAIL FEMORAL (Right)  Patient Location: PACU  Anesthesia Type: Spinal/MAC  Level of Consciousness: awake and alert   Airway and Oxygen Therapy: Patient Spontanous Breathing  Post-op Pain: none  Post-op Assessment: Post-op Vital signs reviewed, Patient's Cardiovascular Status Stable and Respiratory Function Stable. No residual motor block.  Post-op Vital Signs: Reviewed  Filed Vitals:   05/03/14 1615  BP:   Pulse: 90  Temp: 36.3 C  Resp: 25    Complications: No apparent anesthesia complications

## 2014-05-03 NOTE — Addendum Note (Signed)
Addended by: Wyonia Hough on: 05/03/2014 01:50 PM   Modules accepted: Orders

## 2014-05-03 NOTE — Anesthesia Procedure Notes (Signed)
Spinal  Patient location during procedure: OR Start time: 05/03/2014 2:01 PM End time: 05/03/2014 2:08 PM Staffing Anesthesiologist: Aaryn Parrilla Performed by: anesthesiologist  Preanesthetic Checklist Completed: patient identified, site marked, surgical consent, pre-op evaluation, timeout performed, IV checked, risks and benefits discussed and monitors and equipment checked Spinal Block Patient position: sitting Prep: ChloraPrep Patient monitoring: heart rate, cardiac monitor, continuous pulse ox and blood pressure Approach: midline Location: L2-3 Injection technique: single-shot Needle Needle type: Sprotte  Needle gauge: 24 G Needle length: 9 cm Assessment Sensory level: T8 Additional Notes Pt tolerated the procedure well.

## 2014-05-03 NOTE — Progress Notes (Signed)
Decubitus right inner buttock per spouse.

## 2014-05-04 LAB — CBC
HEMATOCRIT: 29.4 % — AB (ref 39.0–52.0)
Hemoglobin: 9.8 g/dL — ABNORMAL LOW (ref 13.0–17.0)
MCH: 31.4 pg (ref 26.0–34.0)
MCHC: 33.3 g/dL (ref 30.0–36.0)
MCV: 94.2 fL (ref 78.0–100.0)
Platelets: 143 10*3/uL — ABNORMAL LOW (ref 150–400)
RBC: 3.12 MIL/uL — ABNORMAL LOW (ref 4.22–5.81)
RDW: 18.1 % — AB (ref 11.5–15.5)
WBC: 6.7 10*3/uL (ref 4.0–10.5)

## 2014-05-04 NOTE — Op Note (Signed)
NAME:  Matthew White, Matthew White NO.:  [id number]  MEDICAL RECORD NO.:  [id number]  LOCATION:  [id number]                        FACILITY:  [facility]  PHYSICIAN:  Alta Corning, M.D.   DATE OF BIRTH:  10/07/1934  DATE OF PROCEDURE:  05/03/2014 DATE OF DISCHARGE:                              OPERATIVE REPORT   PREOPERATIVE DIAGNOSIS:  Impending fracture right proximal femur.  POSTOPERATIVE DIAGNOSIS:  Impending fracture right proximal femur.  PROCEDURE:  Intramedullary rod fixation of right impending pathologic fracture, right femur.  SURGEON:  Alta Corning, M.D.  ASSISTANT:  Gary Fleet, PA-C  ANESTHESIA:  General.  BRIEF HISTORY:  Mr. Attridge is a 80 year old male with a history of having had adenocarcinoma of the esophagus.  He had been treated by Dr. Kavin Leech of the Oncology Service.  He was having significant right hip pain and x-rays were taken.  Initially, read as normal but follow up PET scan showed that he had a large lesion in his proximal femur, went back and reviewed his plain films and they do in fact show a large lytic lesion in the proximal subtrochanteric region.  He was urgently sent over to our office, and we saw him and had a long discussion about treatment options, but felt that rod placement would be appropriate to try to prevent fracture and we brought him to the operating room the following day for fixation.  DESCRIPTION OF PROCEDURE:  The patient was taken to the operating room after adequate anesthesia was obtained with spinal anesthetic.  The patient was placed supine on the operating table and he was then moved onto the fracture table, and put in position for rod.  Following this, fluoroscopic images were taken and small incision was made just proximal to the hip guide, rod was placed down the tip of the trochanter into the shaft of the femur, over reamed with a introductory reamer and guide rod was placed down the shaft of the femur  and measured.  At this point, the femur was reamed to 13 mm.  I got good chatter at 12 and reamed it up to 13 and took an 11 mm rod by 400, which was measured and this was advanced down the femur and it was locked proximally with a large locking screw and this was locked in place.  Attention was then turned distally where a freehand lock was placed. The wound at this point were copiously irrigated and suctioned dry, closed in layers.  Final fluoroscopic images were taken.  The sterile compressive dressing was applied.  The patient was then taken to the recovery room and noted to be in satisfactory condition.  Estimated loss for procedure was minimal.     Alta Corning, M.D.     Corliss Skains  D:  05/03/2014  T:  05/04/2014  Job:  [id number]

## 2014-05-04 NOTE — Evaluation (Signed)
Physical Therapy Evaluation Patient Details Name: Matthew White MRN: [id number] DOB: 1934/02/18 Today's Date: 05/04/2014   History of Present Illness  Pt admitted for pathological fx of R femur.  Pt underwent IM nailing 05/04/14.    Clinical Impression  Pt is min guard assist for transfers and ambulation with RW.  Pt has all home equipment needs and needed level of assist to be provided by spouse.  Spoke with pt and wife regarding HHPT.  They are declining services as this time.  Pt/wife verbalized they will call MD after return home if they change they change their minds and feel services are needed. PT signing off.     Follow Up Recommendations No PT follow up    Equipment Recommendations  None recommended by PT    Recommendations for Other Services       Precautions / Restrictions Precautions Precautions: None Restrictions Weight Bearing Restrictions: Yes RLE Weight Bearing: Weight bearing as tolerated      Mobility  Bed Mobility Overal bed mobility: Modified Independent             General bed mobility comments: Pt has hospital bed at home  Transfers Overall transfer level: Needs assistance Equipment used: Rolling walker (2 wheeled) Transfers: Sit to/from Stand Sit to Stand: Min guard            Ambulation/Gait Ambulation/Gait assistance: Min guard Ambulation Distance (Feet): 75 Feet Assistive device: Rolling walker (2 wheeled) Gait Pattern/deviations: Step-to pattern;Antalgic Gait velocity: decreased   General Gait Details: Pt declined stair training.  Pt's wife is a retired Therapist, sports and feels comfortable with assisting pt up steps into house.  Stairs            Wheelchair Mobility    Modified Rankin (Stroke Patients Only)       Balance                                             Pertinent Vitals/Pain 7/10    Home Living Family/patient expects to be discharged to:: Private residence Living Arrangements:  Spouse/significant other Available Help at Discharge: Family;Available 24 hours/day Type of Home: House Home Access: Stairs to enter Entrance Stairs-Rails: Right;Left;Can reach both Entrance Stairs-Number of Steps: 3 Home Layout: One level Home Equipment: Walker - 2 wheels;Cane - quad      Prior Function Level of Independence: Independent with assistive device(s)               Hand Dominance        Extremity/Trunk Assessment                         Communication   Communication: No difficulties  Cognition Arousal/Alertness: Awake/alert Behavior During Therapy: WFL for tasks assessed/performed Overall Cognitive Status: Within Functional Limits for tasks assessed                      General Comments      Exercises        Assessment/Plan    PT Assessment Patent does not need any further PT services  PT Diagnosis     PT Problem List    PT Treatment Interventions     PT Goals (Current goals can be found in the Care Plan section) Acute Rehab PT Goals Patient Stated Goal: home PT Goal Formulation: No goals set,  d/c therapy    Frequency     Barriers to discharge        Co-evaluation               End of Session Equipment Utilized During Treatment: Gait belt Activity Tolerance: Patient tolerated treatment well Patient left: in chair;with call bell/phone within reach;with family/visitor present Nurse Communication: Mobility status    Functional Assessment Tool Used: clinical judgement Functional Limitation: Mobility: Walking and moving around Mobility: Walking and Moving Around Current Status ([id number]): At least 1 percent but less than 20 percent impaired, limited or restricted Mobility: Walking and Moving Around Goal Status [phone]): At least 1 percent but less than 20 percent impaired, limited or restricted Mobility: Walking and Moving Around Discharge Status [phone]): At least 1 percent but less than 20 percent impaired, limited or  restricted    Time: 0903-0927 PT Time Calculation (min): 24 min   Charges:   PT Evaluation $Initial PT Evaluation Tier I: 1 Procedure PT Treatments $Gait Training: 8-22 mins   PT G Codes:   Functional Assessment Tool Used: clinical judgement Functional Limitation: Mobility: Walking and moving around    Philippa Sicks 05/04/2014, 9:29 AM  Lorrin Goodell, PT  Office # [phone] Pager [phone]

## 2014-05-04 NOTE — Progress Notes (Signed)
   PATIENT ID: Matthew White   1 Day Post-Op Procedure(s) (LRB): INTRAMEDULLARY (IM) NAIL FEMORAL (Right)  Subjective: Sitting up in bed. Says in pain in under control. About to work with therapy. Ready to go home today.   Objective:  Filed Vitals:   05/04/14 0503  BP: 99/50  Pulse: 84  Temp: 97.7 F (36.5 C)  Resp: 16     R hip dressings c/d/i Wiggles toes, distally NVI Calf soft, nontender  Labs:   Recent Labs  05/03/14 1134 05/03/14 1657 05/04/14 0722  HGB 12.1* 12.1* 9.8*   Recent Labs  05/03/14 1657 05/04/14 0722  WBC 10.9* 6.7  RBC 3.87* 3.12*  HCT 36.8* 29.4*  PLT 178 143*   Recent Labs  05/03/14 1134 05/03/14 1657  NA 138 141  K 4.3 5.0  CL 100 102  CO2 26 29  BUN 27* 27*  CREATININE 0.90 0.85  GLUCOSE 128* 173*  CALCIUM 9.7 9.8    Assessment and Plan: 1 day s/p right femur IM nail  D/c today after am PT session and clearance from PT Has oxycodone/oxycontin at home and will continue this on d/c, no new scripts today   VTE proph: ASA 325mg  BID, SCDs

## 2014-05-04 NOTE — Discharge Summary (Signed)
Patient ID: [name] MRN: [id number] DOB/AGE: 05-21-1934 80 y.o.  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 Gout     no flare up in ten years   . Barrett esophagus   . Esophageal cancer 12/22/2012    invasive adenocarcinoma arising in a background of barrett's mucosa  . Diabetes mellitus without complication     type II; last A1C 6.5 10/2012  . GERD (gastroesophageal reflux disease)     managed by prilosec  . Hx of pleural effusion 10/2013    left. pt states it was drained at [facility].     Surgeries: Procedure(s): INTRAMEDULLARY (IM) NAIL FEMORAL on 05/03/2014   Consultants:    Discharged Condition: Improved  Hospital Course: Matthew White is an 80 y.o. male who was admitted 05/03/2014 for operative treatment ofPathological fracture of right femur in neoplastic disease.Right leg pain present for 3 months and has been worsening. He has an impending fracture. He has failed conservative measures. Pain is rated as moderate.  Patient has severe unremitting pain that affects sleep, daily activities, and work/hobbies. After pre-op clearance the patient was taken to the operating room on 05/03/2014 and underwent  Procedure(s): INTRAMEDULLARY (IM) NAIL FEMORAL.    Patient was given perioperative antibiotics: Anti-infectives   Start     Dose/Rate Route Frequency Ordered Stop   05/03/14 1800  ceFAZolin (ANCEF) IVPB 2 g/50 mL premix     2 g 100 mL/hr over 30 Minutes Intravenous Every 6 hours 05/03/14 1656 05/04/14 0047   05/03/14 1130  ceFAZolin (ANCEF) IVPB 2 g/50 mL premix     2 g 100 mL/hr over 30 Minutes Intravenous On call to O.R. 05/03/14 1125 05/03/14 1425       Patient was given sequential compression devices, early ambulation, and ASA 325mg  BID to prevent DVT.  Patient benefited maximally  from hospital stay and there were no complications.  Kept overnight for pain control prior to discharge day 1 po.   Recent vital signs: Patient Vitals for the past 24 hrs:  BP Temp Temp src Pulse Resp SpO2  05/04/14 0503 99/50 mmHg 97.7 F (36.5 C) Oral 84 16 95 %  05/04/14 0016 78/50 mmHg 97.6 F (36.4 C) Oral 105 16 96 %  05/03/14 1945 136/61 mmHg 98.2 F (36.8 C) Oral 102 16 92 %  05/03/14 1700 83/64 mmHg 97.4 F (36.3 C) - 88 16 100 %     Recent laboratory studies:  Recent Labs  05/03/14 1134 05/03/14 1657 05/04/14 0722  WBC 6.0 10.9* 6.7  HGB 12.1* 12.1* 9.8*  HCT 36.8* 36.8* 29.4*  PLT 181 178 143*  NA 138 141  --   K 4.3 5.0  --   CL 100 102  --   CO2 26 29  --   BUN 27* 27*  --   CREATININE 0.90 0.85  --   GLUCOSE 128* 173*  --   INR 1.04  --   --   CALCIUM 9.7 9.8  --      Discharge Medications:     Medication List         acetaminophen 325 MG tablet  Commonly known as:  TYLENOL  Take 650 mg by mouth every 4 (four) hours as needed for mild pain.     ALPRAZolam 1 MG tablet  Commonly known  as:  XANAX  Take 0.5 mg by mouth at bedtime as needed for sleep.     aspirin EC 325 MG tablet  Take 1 tablet (325 mg total) by mouth 2 (two) times daily after a meal. To decrease risk of blood clots x 2 weeks.     capecitabine 500 MG tablet  Commonly known as:  XELODA  Take 2-3 tablets (1,000-1,500 mg total) by mouth 2 (two) times daily. Takes 1,500mg  every morning and 1,000mg  every evening.     docusate sodium 100 MG capsule  Commonly known as:  COLACE  Take 100 mg by mouth 2 (two) times daily.     FISH OIL PO  Take 1 capsule by mouth 2 (two) times daily.     ibuprofen 200 MG tablet  Commonly known as:  ADVIL,MOTRIN  Take 200 mg by mouth 2 (two) times daily as needed for mild pain.     insulin glargine 100 UNIT/ML injection  Commonly known as:  LANTUS  Inject 10 Units into the skin at bedtime.     insulin lispro 100 UNIT/ML injection  Commonly known  as:  HUMALOG  Inject 0-8 Units into the skin every evening. Patient uses a sliding scale from 0-8 units     lactose free nutrition Liqd  Take 237 mLs by mouth 3 (three) times daily between meals.     ENSURE  Take 237 mLs by mouth 4 (four) times daily as needed (for supplement.  Will use Boost or Ensure.).     lidocaine-prilocaine cream  Commonly known as:  EMLA  Apply 1 application topically as needed.     omeprazole 20 MG capsule  Commonly known as:  PRILOSEC  Take 20 mg by mouth daily as needed (for acid reflux).     OxyCODONE 10 mg T12a 12 hr tablet  Commonly known as:  OXYCONTIN  Take 1 tablet (10 mg total) by mouth every 12 (twelve) hours.     oxyCODONE-acetaminophen 5-325 MG per tablet  Commonly known as:  PERCOCET/ROXICET  Take 2 tablets by mouth every 4 (four) hours as needed for severe pain.     polyethylene glycol packet  Commonly known as:  MIRALAX / GLYCOLAX  Take 17 g by mouth daily as needed for mild constipation.     prochlorperazine 5 MG tablet  Commonly known as:  COMPAZINE  Take 1 tablet (5 mg total) by mouth every 6 (six) hours as needed for nausea or vomiting.     vitamin A & D ointment  Apply 1 application topically at bedtime. Applied to buttocks        Diagnostic Studies: Dg Hip Complete Right  04/29/2014   CLINICAL DATA:  Right hip and leg pain  EXAM: RIGHT HIP - COMPLETE 2+ VIEW  COMPARISON:  03/04/2014  FINDINGS: Three views of the right hip submitted. No acute fracture or subluxation. Again noted bilateral narrowing of superior joint space and mild superior acetabular spurring. Stable mild degenerative changes lower lumbar spine.  IMPRESSION: No acute fracture or subluxation.  Stable degenerative changes.   Electronically Signed   By: Lahoma Crocker M.D.   On: 04/29/2014 16:57   Dg Femur Right  05/03/2014   CLINICAL DATA:  ORIF of a right proximal femur fracture  EXAM: DG C-ARM 61-120 MIN; RIGHT FEMUR - 2 VIEW  FINDINGS: Images show placement of a  long intra medullary rod supporting a compression screw reducing the intertrochanteric fracture fragments into near anatomic alignment. No acute fracture. No evidence of  an operative complication.  IMPRESSION: Right proximal femur fracture ORIF. Fracture fragments well aligned.   Electronically Signed   By: Lajean Manes M.D.   On: 05/03/2014 15:44   Dg Femur Right  04/29/2014   CLINICAL DATA:  Esophageal cancer.  Right leg pain.  EXAM: RIGHT FEMUR - 2 VIEW  COMPARISON:  None.  FINDINGS: Degenerative changes right hip and knee. A very questionable subtle lucency is noted in the central portion of the upper diaphysis of the right femur. An active lesion such as a metastatic lesion cannot be excluded. Whole-body bone scan with attention to the right femur suggested.  IMPRESSION: Very questionable subtle lucency in the central portion of the proximal diaphysis of the right femur for which whole-body bone scan with attention to the right femur suggested.   Electronically Signed   By: Marcello Moores  Register   On: 04/29/2014 17:02   Dg C-arm 1-60 Min  05/03/2014   CLINICAL DATA:  ORIF of a right proximal femur fracture  EXAM: DG C-ARM 61-120 MIN; RIGHT FEMUR - 2 VIEW  FINDINGS: Images show placement of a long intra medullary rod supporting a compression screw reducing the intertrochanteric fracture fragments into near anatomic alignment. No acute fracture. No evidence of an operative complication.  IMPRESSION: Right proximal femur fracture ORIF. Fracture fragments well aligned.   Electronically Signed   By: Lajean Manes M.D.   On: 05/03/2014 15:44    Disposition: 01-Home or Self Care      Discharge Instructions   Call MD / Call 911    Complete by:  As directed   If you experience chest pain or shortness of breath, CALL 911 and be transported to the hospital emergency room.  If you develope a fever above 101 F, pus (white drainage) or increased drainage or redness at the wound, or calf pain, call your surgeon's  office.     Constipation Prevention    Complete by:  As directed   Drink plenty of fluids.  Prune juice may be helpful.  You may use a stool softener, such as Colace (over the counter) 100 mg twice a day.  Use MiraLax (over the counter) for constipation as needed.     Diet - low sodium heart healthy    Complete by:  As directed      Increase activity slowly as tolerated    Complete by:  As directed      Weight bearing as tolerated    Complete by:  As directed   Laterality:  right  Extremity:  Lower           Follow-up Information   Follow up with GRAVES,JOHN L, MD. Schedule an appointment as soon as possible for a visit in 2 weeks.   Specialty:  Orthopedic Surgery   Contact information:   [place] [address] [phone]        Signed: Grier Mitts 05/04/2014, 4:16 PM

## 2014-05-07 ENCOUNTER — Encounter (HOSPITAL_COMMUNITY): Payer: Self-pay | Admitting: Orthopedic Surgery

## 2014-05-17 ENCOUNTER — Telehealth: Payer: Self-pay

## 2014-05-17 ENCOUNTER — Encounter (HOSPITAL_COMMUNITY): Payer: Self-pay

## 2014-05-17 ENCOUNTER — Ambulatory Visit
Admission: RE | Admit: 2014-05-17 | Discharge: 2014-05-17 | Disposition: A | Discharge status: Home / Self Care | Payer: Medicare PPO | Source: Ambulatory Visit | Attending: Radiation Oncology | Admitting: Radiation Oncology

## 2014-05-17 ENCOUNTER — Ambulatory Visit (HOSPITAL_COMMUNITY)
Admission: RE | Admit: 2014-05-17 | Discharge: 2014-05-17 | Disposition: A | Discharge status: Home / Self Care | Payer: Medicare PPO | Source: Ambulatory Visit | Attending: Nurse Practitioner | Admitting: Nurse Practitioner

## 2014-05-17 VITALS — BP 92/59 | HR 103 | Temp 98.0°F | Wt 152.7 lb

## 2014-05-17 DIAGNOSIS — C7951 Secondary malignant neoplasm of bone: Secondary | ICD-10-CM

## 2014-05-17 DIAGNOSIS — C159 Malignant neoplasm of esophagus, unspecified: Secondary | ICD-10-CM | POA: Insufficient documentation

## 2014-05-17 DIAGNOSIS — C7952 Secondary malignant neoplasm of bone marrow: Principal | ICD-10-CM

## 2014-05-17 DIAGNOSIS — Z510 Encounter for antineoplastic radiation therapy: Secondary | ICD-10-CM | POA: Insufficient documentation

## 2014-05-17 NOTE — Progress Notes (Signed)
Please see the Nurse Progress Note in the MD Initial Consult Encounter for this patient. 

## 2014-05-17 NOTE — Telephone Encounter (Signed)
Spoke with Truman Hayward at [facility].Amy RN will inform patient's wife that script has been called in.Dr.Moody informed patient not to continue prednisone pack.

## 2014-05-19 DIAGNOSIS — C7952 Secondary malignant neoplasm of bone marrow: Principal | ICD-10-CM

## 2014-05-19 DIAGNOSIS — C7951 Secondary malignant neoplasm of bone: Secondary | ICD-10-CM | POA: Insufficient documentation

## 2014-05-19 NOTE — Progress Notes (Signed)
Radiation Oncology         [phone] ________________________________  Name: Matthew White MRN: [id number]  Date: 05/17/2014  DOB: 07-04-1934  Follow-Up Visit Note  CC: Precious Reel, MD  Ladell Pier, MD  Diagnosis:   Metastatic esophageal cancer with bony metastasis  Interval Since Last Radiation:  The patient completed radiation treatment to the esophagus on 02/27/2013   Narrative:  I was this patient today on an urgent basis. He has a diagnosis of metastatic adenocarcinoma of the esophagus. He did receive radiation treatment in our department in a definitive manner preoperatively with concurrent chemotherapy in March of 2014. More recently the patient was noted to have metastatic disease and there was significant concern regarding weakening of the right beam or. He was felt to have an intending fracture and he therefore proceeded to undergo surgery with Dr. Berenice Primas with intramedullary rod fixation. This was completed on 05/03/2014. Postoperative radiation treatment had been discussed with him.  For restaging the patient underwent a CT scan of the chest on 05/17/2014. There was interval development of multiple bony metastases including a significant lesion in the T2 vertebral body. This penetrated through the posterior cortex into the anterior spinal canal. A nearby large lesion was also seen in particular within the left clavicular head. Given these findings I have been asked to see the patient today for consideration of radiation treatment.  The patient really denies any significant change in symptoms in this region. He has had some discomfort in the region of the medial clavicle but no major change in this. This has been present for some time. He really denies any bony pain in the upper thoracic spine. No weakness, numbness, change in bowel or bladder.               ALLERGIES:  is allergic to ivp dye.  Meds: Current Outpatient Prescriptions  Medication Sig Dispense Refill  .  acetaminophen (TYLENOL) 325 MG tablet Take 650 mg by mouth every 4 (four) hours as needed for mild pain.       Marland Kitchen ALPRAZolam (XANAX) 1 MG tablet Take 0.5 mg by mouth at bedtime as needed for sleep.      Marland Kitchen aspirin EC 325 MG tablet Take 1 tablet (325 mg total) by mouth 2 (two) times daily after a meal. To decrease risk of blood clots x 2 weeks.  30 tablet  0  . capecitabine (XELODA) 500 MG tablet Take 2-3 tablets (1,000-1,500 mg total) by mouth 2 (two) times daily. Takes 1,500mg  every morning and 1,000mg  every evening.  105 tablet  0  . dexamethasone (DECADRON) 4 MG tablet Take 4 mg by mouth 2 (two) times daily. Called into [facility] [address] 1 po bid quantity 30 tabs.      . docusate sodium (COLACE) 100 MG capsule Take 100 mg by mouth 2 (two) times daily.      Marland Kitchen ENSURE (ENSURE) Take 237 mLs by mouth 4 (four) times daily as needed (for supplement.  Will use Boost or Ensure.).       Marland Kitchen ibuprofen (ADVIL,MOTRIN) 200 MG tablet Take 200 mg by mouth 2 (two) times daily as needed for mild pain.       Marland Kitchen insulin glargine (LANTUS) 100 UNIT/ML injection Inject 10 Units into the skin at bedtime.      . insulin lispro (HUMALOG) 100 UNIT/ML injection Inject 0-8 Units into the skin every evening. Patient uses a sliding scale from 0-8 units      .  lactose free nutrition (BOOST) LIQD Take 237 mLs by mouth 3 (three) times daily between meals.      . lidocaine-prilocaine (EMLA) cream Apply 1 application topically as needed.      . Omega-3 Fatty Acids (FISH OIL PO) Take 1 capsule by mouth 2 (two) times daily.      Marland Kitchen omeprazole (PRILOSEC) 20 MG capsule Take 20 mg by mouth daily as needed (for acid reflux).       . OxyCODONE (OXYCONTIN) 10 mg T12A 12 hr tablet Take 1 tablet (10 mg total) by mouth every 12 (twelve) hours.  60 tablet  0  . oxyCODONE-acetaminophen (PERCOCET/ROXICET) 5-325 MG per tablet Take 2 tablets by mouth every 4 (four) hours as needed for severe pain.  100 tablet  0  . polyethylene glycol (MIRALAX /  GLYCOLAX) packet Take 17 g by mouth daily as needed for mild constipation.       . prochlorperazine (COMPAZINE) 5 MG tablet Take 1 tablet (5 mg total) by mouth every 6 (six) hours as needed for nausea or vomiting.  30 tablet  2  . Vitamins A & D (VITAMIN A & D) ointment Apply 1 application topically at bedtime. Applied to buttocks      . predniSONE (STERAPRED UNI-PAK) 10 MG tablet Take by mouth daily.       No current facility-administered medications for this encounter.    Physical Findings: The patient is in no acute distress. Patient is alert and oriented.  weight is 152 lb 11.2 oz (69.264 kg). His temperature is 98 F (36.7 C). His blood pressure is 92/59 and his pulse is 103. His oxygen saturation is 93%. .   General: Sitting in a wheelchair, in no acute distress. The patient is able to bear some weight on the right leg but he has significant pain with ambulation  HEENT: Normocephalic, atraumatic Cardiovascular: Regular rate and rhythm Respiratory: Clear to auscultation bilaterally GI: Soft, nontender, normal bowel sounds Extremities: No edema present; prominence of the left clavicular head is present with minimal tenderness   Lab Findings: Lab Results  Component Value Date   WBC 6.7 05/04/2014   HGB 9.8* 05/04/2014   HCT 29.4* 05/04/2014   MCV 94.2 05/04/2014   PLT 143* 05/04/2014     Radiographic Findings: Dg Hip Complete Right  04/29/2014   CLINICAL DATA:  Right hip and leg pain  EXAM: RIGHT HIP - COMPLETE 2+ VIEW  COMPARISON:  03/04/2014  FINDINGS: Three views of the right hip submitted. No acute fracture or subluxation. Again noted bilateral narrowing of superior joint space and mild superior acetabular spurring. Stable mild degenerative changes lower lumbar spine.  IMPRESSION: No acute fracture or subluxation.  Stable degenerative changes.   Electronically Signed   By: Lahoma Crocker M.D.   On: 04/29/2014 16:57   Dg Femur Right  05/03/2014   CLINICAL DATA:  ORIF of a right  proximal femur fracture  EXAM: DG C-ARM 61-120 MIN; RIGHT FEMUR - 2 VIEW  FINDINGS: Images show placement of a long intra medullary rod supporting a compression screw reducing the intertrochanteric fracture fragments into near anatomic alignment. No acute fracture. No evidence of an operative complication.  IMPRESSION: Right proximal femur fracture ORIF. Fracture fragments well aligned.   Electronically Signed   By: Lajean Manes M.D.   On: 05/03/2014 15:44   Dg Femur Right  04/29/2014   CLINICAL DATA:  Esophageal cancer.  Right leg pain.  EXAM: RIGHT FEMUR - 2 VIEW  COMPARISON:  None.  FINDINGS: Degenerative changes right hip and knee. A very questionable subtle lucency is noted in the central portion of the upper diaphysis of the right femur. An active lesion such as a metastatic lesion cannot be excluded. Whole-body bone scan with attention to the right femur suggested.  IMPRESSION: Very questionable subtle lucency in the central portion of the proximal diaphysis of the right femur for which whole-body bone scan with attention to the right femur suggested.   Electronically Signed   By: Marcello Moores  Register   On: 04/29/2014 17:02   Ct Chest Wo Contrast  05/17/2014   CLINICAL DATA:  Esophageal cancer.  EXAM: CT CHEST WITHOUT CONTRAST  TECHNIQUE: Multidetector CT imaging of the chest was performed following the standard protocol without IV contrast.  COMPARISON:  02/19/2014 PET-CT from [facility]. CT chest 12/29/2012.  FINDINGS: Soft tissue / Mediastinum: The tip of the right-sided Port-A-Cath is positioned at the junction of the SVC and RA.  There is no axillary lymphadenopathy. Patient is status post gastric pull-through procedure. No evidence for mediastinal lymphadenopathy. No bulky hilar lymphadenopathy. Heart size is normal. Coronary artery calcification is noted. No pericardial effusion.  Lung windows show emphysema in the upper lobes. 19 mm right lower lobe pulmonary nodule on image 38 measured 15 mm  on the recent PET-CT. 4.5 cm focus of masslike opacity in the posterior right lower lobe inferiorly measures 3.7 cm today and is much less confluent on today's exam band previously. 2.5 cm subpleural nodule in the lingula on today's study was 2.5 cm on the previous exam. There is evidence of volume loss in the left hemi thorax with areas of atelectasis noted in the lower lobe.  Bones: A large lytic lesion essentially replaces the T2 vertebral body. This is new in the interval. There is evidence for epidural tumor spread into the anterior spinal canal at this level. 4.4 x 3.5 cm lesion in the head of the left clavicle is new in the interval. Lytic lesion in the posterior right fourth rib is new. 2 cm lytic lesion in the posterior left tenth rib is new. Right transverse process lesion at L2 is new. Lytic lesion in the left T9 vertebral body is new.  Upper Abdomen: 5.0 cm cystic lesion in the upper pole the left kidney is stable.  IMPRESSION: Interval development of multiple bony metastases including a nearly 4 cm lesion in the T2 vertebral body which essentially destroys the vertebral body and penetrates through the posterior cortex into the anterior spinal canal. Another large lesion is seen in the left clavicular head. Other scattered smaller rib and spinal lesions are identified.  Multiple masslike opacities scattered in the lungs. These also suggest metastatic disease in show a variable response to therapy with some lesions progressing in the interval and others decreasing.  I discussed these findings by telephone with Ned Card at approximately 10:52 a.m. on 05/17/2014.   Electronically Signed   By: Misty Stanley M.D.   On: 05/17/2014 10:52   Dg C-arm 1-60 Min  05/03/2014   CLINICAL DATA:  ORIF of a right proximal femur fracture  EXAM: DG C-ARM 61-120 MIN; RIGHT FEMUR - 2 VIEW  FINDINGS: Images show placement of a long intra medullary rod supporting a compression screw reducing the intertrochanteric fracture  fragments into near anatomic alignment. No acute fracture. No evidence of an operative complication.  IMPRESSION: Right proximal femur fracture ORIF. Fracture fragments well aligned.   Electronically Signed   By: Dedra Skeens.D.  On: 05/03/2014 15:44    Impression:    The patient has metastatic esophageal cancer. He had a significant finding of a destructive lesion within the T2 vertebral body and also a nearby lesion which is destructive within the left clavicular head. I believe that the patient is an appropriate candidate to proceed with palliative radiation treatment in the near future to both of these areas. We also discussed possible palliative radiation treatment postoperatively to the right femur but I would want the patient to heal a little more prior to beginning this.  I therefore discussed with the patient a potential course of in this fashion. We discussed a possible 2-3 week course of treatment depending on whether chemotherapy will be given next week. I have had a chance to discuss this case further with Dr. Benay Spice who indicated that he would hold off on chemotherapy for the time being. I therefore anticipate a 2 week course of treatment to the upper T. spine and left medial clavicle. We did discuss the possible side effects and risks of treatment including esophagitis in particular. The patient wishes to proceed with treatment.  Plan:  The patient will proceed with a simulation today such that we can begin treatment planning. I anticipate beginning a 2 week course of treatment early next week on Monday.   Jodelle Gross, M.D., Ph.D.

## 2014-05-20 ENCOUNTER — Ambulatory Visit (HOSPITAL_BASED_OUTPATIENT_CLINIC_OR_DEPARTMENT_OTHER): Payer: Medicare PPO | Admitting: Nurse Practitioner

## 2014-05-20 ENCOUNTER — Telehealth: Payer: Self-pay | Admitting: Oncology

## 2014-05-20 ENCOUNTER — Ambulatory Visit
Admission: RE | Admit: 2014-05-20 | Discharge: 2014-05-20 | Disposition: A | Discharge status: Home / Self Care | Payer: Medicare PPO | Source: Ambulatory Visit | Attending: Radiation Oncology | Admitting: Radiation Oncology

## 2014-05-20 ENCOUNTER — Other Ambulatory Visit (HOSPITAL_BASED_OUTPATIENT_CLINIC_OR_DEPARTMENT_OTHER): Payer: Medicare PPO

## 2014-05-20 ENCOUNTER — Ambulatory Visit: Payer: Medicare PPO

## 2014-05-20 VITALS — BP 97/61 | HR 77 | Temp 97.6°F | Resp 18 | Ht 73.0 in | Wt 150.3 lb

## 2014-05-20 DIAGNOSIS — C159 Malignant neoplasm of esophagus, unspecified: Secondary | ICD-10-CM

## 2014-05-20 DIAGNOSIS — M899 Disorder of bone, unspecified: Secondary | ICD-10-CM

## 2014-05-20 DIAGNOSIS — C7952 Secondary malignant neoplasm of bone marrow: Principal | ICD-10-CM

## 2014-05-20 DIAGNOSIS — C782 Secondary malignant neoplasm of pleura: Secondary | ICD-10-CM

## 2014-05-20 DIAGNOSIS — C155 Malignant neoplasm of lower third of esophagus: Secondary | ICD-10-CM

## 2014-05-20 DIAGNOSIS — Z510 Encounter for antineoplastic radiation therapy: Secondary | ICD-10-CM | POA: Diagnosis not present

## 2014-05-20 DIAGNOSIS — M949 Disorder of cartilage, unspecified: Secondary | ICD-10-CM

## 2014-05-20 DIAGNOSIS — C7951 Secondary malignant neoplasm of bone: Secondary | ICD-10-CM

## 2014-05-20 LAB — CBC WITH DIFFERENTIAL/PLATELET
BASO%: 0.3 % (ref 0.0–2.0)
BASOS ABS: 0 10*3/uL (ref 0.0–0.1)
EOS%: 0 % (ref 0.0–7.0)
Eosinophils Absolute: 0 10*3/uL (ref 0.0–0.5)
HCT: 34.1 % — ABNORMAL LOW (ref 38.4–49.9)
HEMOGLOBIN: 11.4 g/dL — AB (ref 13.0–17.1)
LYMPH#: 1.2 10*3/uL (ref 0.9–3.3)
LYMPH%: 12.5 % — ABNORMAL LOW (ref 14.0–49.0)
MCH: 32.8 pg (ref 27.2–33.4)
MCHC: 33.5 g/dL (ref 32.0–36.0)
MCV: 97.8 fL (ref 79.3–98.0)
MONO#: 1 10*3/uL — ABNORMAL HIGH (ref 0.1–0.9)
MONO%: 9.9 % (ref 0.0–14.0)
NEUT%: 77.3 % — ABNORMAL HIGH (ref 39.0–75.0)
NEUTROS ABS: 7.6 10*3/uL — AB (ref 1.5–6.5)
Platelets: 433 10*3/uL — ABNORMAL HIGH (ref 140–400)
RBC: 3.49 10*6/uL — ABNORMAL LOW (ref 4.20–5.82)
RDW: 21.7 % — AB (ref 11.0–14.6)
WBC: 9.9 10*3/uL (ref 4.0–10.3)

## 2014-05-20 LAB — COMPREHENSIVE METABOLIC PANEL (CC13)
ALBUMIN: 3.4 g/dL — AB (ref 3.5–5.0)
ALT: 10 U/L (ref 0–55)
AST: 17 U/L (ref 5–34)
Alkaline Phosphatase: 104 U/L (ref 40–150)
Anion Gap: 10 mEq/L (ref 3–11)
BUN: 41.8 mg/dL — AB (ref 7.0–26.0)
CHLORIDE: 105 meq/L (ref 98–109)
CO2: 26 mEq/L (ref 22–29)
Calcium: 9.7 mg/dL (ref 8.4–10.4)
Creatinine: 1.1 mg/dL (ref 0.7–1.3)
Glucose: 112 mg/dl (ref 70–140)
Potassium: 4.9 mEq/L (ref 3.5–5.1)
Sodium: 142 mEq/L (ref 136–145)
Total Bilirubin: 0.47 mg/dL (ref 0.20–1.20)
Total Protein: 6.8 g/dL (ref 6.4–8.3)

## 2014-05-20 MED ORDER — OXYCODONE HCL ER 20 MG PO T12A: 20.0000 mg | EXTENDED_RELEASE_TABLET | Freq: Two times a day (BID) | ORAL | Status: DC

## 2014-05-20 MED ORDER — OXYCODONE-ACETAMINOPHEN 5-325 MG PO TABS: 2.0000 | ORAL_TABLET | ORAL | Status: DC | PRN

## 2014-05-20 NOTE — Progress Notes (Addendum)
[facility] OFFICE PROGRESS NOTE   Diagnosis:  Metastatic esophagus cancer.  INTERVAL HISTORY:   Matthew White returns as scheduled. He completed cycle 3 CAPOX beginning 04/29/2014. He had nausea/vomiting for 7-8 days after the oxaliplatin. He had a few "tiny" mouth sores. Bowel habits with alternating constipation and diarrhea. The cold sensitivity lasted 7-8 days. He denies any persistent neuropathy symptoms.  He underwent placement of a right femur rod for an impending fracture on 05/03/2014. He reports continued right hip/upper leg pain. He is currently taking OxyContin 10 mg every 8 hours with oxycodone or ibuprofen every 4 hours.  He is scheduled to begin radiation to T2 and the left clavicle later today. He denies neck and back pain. He has pain at the left shoulder which he describes as "bursitis".  Objective:  Vital signs in last 24 hours:  Blood pressure 97/61, pulse 77, temperature 97.6 F (36.4 C), temperature source Oral, resp. rate 18, height _0  (1.854 m), weight 150 lb 4.8 oz (68.176 kg).    HEENT: No thrush or ulcerations. Lymphatics: 1/2-1 cm right scalene node. Resp: Lungs clear. Cardio: Regular cardiac rhythm. GI: Soft and nontender. No hepatomegaly. Vascular: No leg edema. Skin: Surgical sites right leg appear well healed.  Port-A-Cath site without erythema.    Lab Results:  Lab Results  Component Value Date   WBC 9.9 05/20/2014   HGB 11.4* 05/20/2014   HCT 34.1* 05/20/2014   MCV 97.8 05/20/2014   PLT 433* 05/20/2014   NEUTROABS 7.6* 05/20/2014    Imaging:  No results found.  Medications: I have reviewed the patient's current medications.  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  -Status post an esophagogastrectomy 04/20/2013  with the pathology confirming a pT1b,pN1 tumor, grade 1 with negative surgical margins.  2. History of Barrett's esophagus.  3. Adult onset diabetes.  4. Odynophagia/early satiety. The odynophagia is better.  5. Postoperative hoarseness-status post collagen injection therapy at [facility].  6. Dysphagia progressive over the past 2-4 months. He is sheduled for a dilatation procedure in the near future.  7. Chest CT 02/14/2014 showed a new masslike partially calcified lesion in the right lower lobe peripherally. There were some new lower lobe reticulonodular opacities. A peripherally located right lower lobe superior segment nodule was larger.  PET scan 02/19/2014 with evidence of multiple hypermetabolic lung nodules, hypermetabolic bone lesions, and a hypermetabolic focus at the left side of the prostate  CT biopsy of a right lower lobe subpleural nodule 03/07/2014 confirmed metastatic adenocarcinoma consistent with esophagus cancer. HER-2 negative. Cycle 1 CAPOX beginning 03/18/2014.  Cycle 2 CAPOX beginning 04/08/2014. Cycle 3 CAPOX beginning 04/29/2014. Restaging chest CT 05/17/2014 with interval development of multiple bone metastases including a nearly 4 cm lesion in the T2 vertebral body essentially destroying the vertebral body and penetrating through the posterior cortex into the anterior spinal canal. Another large lesion seen in the left clavicular head. Other scattered smaller rib and spinal lesions identified. Multiple masslike opacities scattered in the lungs. Some lesions progressing in the interval and others decreasing. 8. Port-A-Cath placement 03/11/2014  9. Severe pain at the left clavicle after a fall, plain x-ray of the left clavicle 03/18/2014 confirmed a lytic lesion of the medial left clavicle, PET scan at [facility] 02/19/2014 revealed a hypermetabolic medial left clavicle lesion.  10. Constipation-likely secondary to  narcotics.  11. Delayed nausea following cycle 2 CAPOX. Aloxi added  beginning with cycle 3.  12. Impending pathologic fracture right femur status post rod placement 05/03/2014.      Disposition: Matthew White has completed 3 cycles of oxaliplatin/Xeloda. Restaging chest CT shows evidence of progression in the bones including large lesions at T2 and the left clavicular head. He is scheduled to begin radiation to these areas later today.  Dr. Benay Spice reviewed the CT report with Matthew White and his wife.  Dr. Benay Spice recommends placing chemotherapy on hold during the course of radiation. Discussion was initiated regarding second line chemotherapy versus a supportive care approach following completion of radiation. He will return for a followup visit on 06/05/2014 for more discussion.  He is having more pain at the right upper leg/hip. We adjusted the OxyContin dose to 20 mg every 12 hours with continuation of oxycodone as needed. He understands to contact the office prior to his next visit with poor pain control.    Patient seen with Dr. Benay Spice. 25 minutes were spent face to face at today's visit with the majority that time involved in counseling/coordination of care.  Owens Shark ANP/GNP-BC   05/20/2014  2:02 PM  This was assured visit with Ned Card. I discussed the restaging CT findings with Matthew White and his wife. I contacted him by telephone 05/17/2014 with the T2 finding. He is scheduled to begin palliative radiation today.  I recommend discontinuing the Xeloda and oxaliplatin. There is clinical evidence of disease progression. He has increased pain in the right hip and underwent placement of a right femur intramedullary rod 05/03/2014.  He will complete palliative radiation to the thoracic spine and left clavicle. This will be followed by palliative radiation to the right femur. I explained the small chance of clinical improvement with further systemic therapy. He will return for an office visit 06/05/2014. We will discuss comfort care versus a  trial of salvage chemotherapy.  We adjusted the narcotic regimen.  Julieanne Manson, M.D.

## 2014-05-20 NOTE — Telephone Encounter (Signed)
gv and pritned appt sched and avs for pt for June

## 2014-05-20 NOTE — Progress Notes (Signed)
  Radiation Oncology         [phone] ________________________________  Name: Matthew White MRN: [id number]  Date: 05/20/2014  DOB: 1934/10/21  Simulation Verification Note  Status: outpatient  NARRATIVE: The patient was brought to the treatment unit and placed in the planned treatment position. The clinical setup was verified. Then port films were obtained and uploaded to the radiation oncology medical record software.  The treatment beams were carefully compared against the planned radiation fields. The position location and shape of the radiation fields was reviewed. They targeted volume of tissue appears to be appropriately covered by the radiation beams. Organs at risk appear to be excluded as planned.  Based on my personal review, I approved the simulation verification. The patient's treatment will proceed as planned.  ------------------------------------------------  Sheral Apley Tammi Klippel, M.D.

## 2014-05-21 ENCOUNTER — Ambulatory Visit
Admission: RE | Admit: 2014-05-21 | Discharge: 2014-05-21 | Disposition: A | Discharge status: Home / Self Care | Payer: Medicare PPO | Source: Ambulatory Visit | Attending: Radiation Oncology | Admitting: Radiation Oncology

## 2014-05-21 DIAGNOSIS — Z510 Encounter for antineoplastic radiation therapy: Secondary | ICD-10-CM | POA: Diagnosis not present

## 2014-05-22 ENCOUNTER — Ambulatory Visit
Admission: RE | Admit: 2014-05-22 | Discharge: 2014-05-22 | Disposition: A | Discharge status: Home / Self Care | Payer: Medicare PPO | Source: Ambulatory Visit | Attending: Radiation Oncology | Admitting: Radiation Oncology

## 2014-05-22 DIAGNOSIS — Z510 Encounter for antineoplastic radiation therapy: Secondary | ICD-10-CM | POA: Diagnosis not present

## 2014-05-23 ENCOUNTER — Ambulatory Visit
Admission: RE | Admit: 2014-05-23 | Discharge: 2014-05-23 | Disposition: A | Discharge status: Home / Self Care | Payer: Medicare PPO | Source: Ambulatory Visit | Attending: Radiation Oncology | Admitting: Radiation Oncology

## 2014-05-23 DIAGNOSIS — Z510 Encounter for antineoplastic radiation therapy: Secondary | ICD-10-CM | POA: Diagnosis not present

## 2014-05-24 ENCOUNTER — Encounter: Payer: Self-pay | Admitting: Radiation Oncology

## 2014-05-24 ENCOUNTER — Other Ambulatory Visit: Payer: Self-pay | Admitting: Nurse Practitioner

## 2014-05-24 ENCOUNTER — Telehealth: Payer: Self-pay | Admitting: Oncology

## 2014-05-24 ENCOUNTER — Ambulatory Visit
Admission: RE | Admit: 2014-05-24 | Discharge: 2014-05-24 | Disposition: A | Discharge status: Home / Self Care | Payer: Medicare PPO | Source: Ambulatory Visit | Attending: Radiation Oncology | Admitting: Radiation Oncology

## 2014-05-24 VITALS — BP 115/73 | HR 94 | Temp 98.1°F | Resp 20 | Wt 144.8 lb

## 2014-05-24 DIAGNOSIS — C159 Malignant neoplasm of esophagus, unspecified: Secondary | ICD-10-CM

## 2014-05-24 DIAGNOSIS — C7952 Secondary malignant neoplasm of bone marrow: Principal | ICD-10-CM

## 2014-05-24 DIAGNOSIS — C7951 Secondary malignant neoplasm of bone: Secondary | ICD-10-CM

## 2014-05-24 MED ORDER — SUCRALFATE 1 G PO TABS: 1.0000 g | ORAL_TABLET | Freq: Four times a day (QID) | ORAL | Status: DC

## 2014-05-24 MED ORDER — DEXAMETHASONE 4 MG PO TABS: 4.0000 mg | ORAL_TABLET | Freq: Two times a day (BID) | ORAL | Status: DC

## 2014-05-24 NOTE — Telephone Encounter (Signed)
added lb 6/15. s/w wife she is aware.

## 2014-05-24 NOTE — Progress Notes (Signed)
Weekly rad txs, 5/10 tx t2/lt clavicle, scratchy thorat, hoarseness, difficuklt swallowing foods, going to softer foods, does eat sausage/gravy biscuits after rad txs, has to sit up  A while, or he throws up after eating, requesting something to help the esophagus burning, no appetite, reviewed  Nutrition, skin irritation, fatigue, pain, try milkshakes with pnut butter,  10:52 AM

## 2014-05-24 NOTE — Progress Notes (Signed)
Department of Radiation Oncology  Phone:  [phone] Fax:        [phone]  Weekly Treatment Note    Name: Matthew White Date: 05/24/2014 MRN: [id number] DOB: [birth year]   Current dose: 15 Gy  Current fraction: 5   MEDICATIONS: Current Outpatient Prescriptions  Medication Sig Dispense Refill  . acetaminophen (TYLENOL) 325 MG tablet Take 650 mg by mouth every 4 (four) hours as needed for mild pain.       Marland Kitchen ALPRAZolam (XANAX) 1 MG tablet Take 0.5 mg by mouth at bedtime as needed for sleep.      Marland Kitchen aspirin EC 325 MG tablet Take 1 tablet (325 mg total) by mouth 2 (two) times daily after a meal. To decrease risk of blood clots x 2 weeks.  30 tablet  0  . BD ULTRA-FINE PEN NEEDLES 29G X 12.7MM MISC       . capecitabine (XELODA) 500 MG tablet Take 2-3 tablets (1,000-1,500 mg total) by mouth 2 (two) times daily. Takes 1,500mg  every morning and 1,000mg  every evening.  105 tablet  0  . dexamethasone (DECADRON) 4 MG tablet Take 4 mg by mouth 2 (two) times daily. Called into [facility] [address] 1 po bid quantity 30 tabs.      . docusate sodium (COLACE) 100 MG capsule Take 100 mg by mouth 2 (two) times daily.      Marland Kitchen ENSURE (ENSURE) Take 237 mLs by mouth 4 (four) times daily as needed (for supplement.  Will use Boost or Ensure.).       Marland Kitchen ibuprofen (ADVIL,MOTRIN) 200 MG tablet Take 200 mg by mouth 2 (two) times daily as needed for mild pain.       Marland Kitchen insulin glargine (LANTUS) 100 UNIT/ML injection Inject 10 Units into the skin at bedtime.      . insulin lispro (HUMALOG) 100 UNIT/ML injection Inject 0-8 Units into the skin every evening. Patient uses a sliding scale from 0-8 units      . lactose free nutrition (BOOST) LIQD Take 237 mLs by mouth 3 (three) times daily between meals.      . lidocaine-prilocaine (EMLA) cream Apply 1 application topically as needed.      . Omega-3 Fatty Acids (FISH OIL PO) Take 1 capsule by mouth 2 (two) times daily.      Marland Kitchen omeprazole (PRILOSEC) 20 MG capsule  Take 20 mg by mouth daily as needed (for acid reflux).       . ONE TOUCH ULTRA TEST test strip       . OxyCODONE (OXYCONTIN) 20 mg T12A 12 hr tablet Take 1 tablet (20 mg total) by mouth every 12 (twelve) hours.  60 tablet  0  . oxyCODONE-acetaminophen (PERCOCET/ROXICET) 5-325 MG per tablet Take 2 tablets by mouth every 4 (four) hours as needed for severe pain.  100 tablet  0  . polyethylene glycol (MIRALAX / GLYCOLAX) packet Take 17 g by mouth daily as needed for mild constipation.       . prochlorperazine (COMPAZINE) 5 MG tablet Take 1 tablet (5 mg total) by mouth every 6 (six) hours as needed for nausea or vomiting.  30 tablet  2  . Vitamins A & D (VITAMIN A & D) ointment Apply 1 application topically at bedtime. Applied to buttocks       No current facility-administered medications for this encounter.     ALLERGIES: Ivp dye   LABORATORY DATA:  Lab Results  Component Value Date   WBC 9.9 05/20/2014  HGB 11.4* 05/20/2014   HCT 34.1* 05/20/2014   MCV 97.8 05/20/2014   PLT 433* 05/20/2014   Lab Results  Component Value Date   NA 142 05/20/2014   K 4.9 05/20/2014   CL 102 05/03/2014   CO2 26 05/20/2014   Lab Results  Component Value Date   ALT 10 05/20/2014   AST 17 05/20/2014   ALKPHOS 104 05/20/2014   BILITOT 0.47 05/20/2014     NARRATIVE: Matthew White was seen today for weekly treatment management. The chart was checked and the patient's films were reviewed. The patient states that he is feeling some soreness of throat. This is caused some difficulty with swallowing. The patient does take Prilosec. He also continues to complain of pain in the right femur at the site of surgery. We had previously discussed treating this after some additional healing and he is suitable to begin this process.  PHYSICAL EXAMINATION: weight is 144 lb 12.8 oz (65.681 kg). His oral temperature is 98.1 F (36.7 C). His blood pressure is 115/73 and his pulse is 94. His respiration is 20.      no thrush present within  the oral cavity. The patient's surgical incisions are well healed within the right lower extremity.  ASSESSMENT: The patient is doing satisfactorily with treatment.  PLAN: We will continue with the patient's radiation treatment as planned. The patient has been given a prescription for Carafate and a refill for Decadron. I discussed tapering his steroids beginning the Monday after he finishes treatment next Friday. At that point, 3 days after he finishes, I recommended that he decrease his steroids to 4 mg a day. He is to take this one week and then taper to 2 mg per day for an additional week.  The patient will also proceed for simulation today for treatment planning to the right femur.

## 2014-05-26 NOTE — Progress Notes (Signed)
  Radiation Oncology         [phone] ________________________________  Name: Matthew White MRN: [id number]  Date: 05/24/2014  DOB: 1934/04/05  COMPLEX SIMULATION  NOTE  Diagnosis: Metastatic esophageal cancer  Narrative The patient has undergone a simulation for additional treatment. The patient will also receive palliative radiation treatment to the right lower extremity. He underwent a simulation to treat this area. The patient was brought to the simulation suite and placed in a supine, treatment position. A customized VAC lock bag was constructed for patient immobilization. This will be used daily during history.  To accomplish the this treatment, a total of 2 customized blocks have been designed for this purpose, and each of these complex treatment devices will be used on a daily basis. A complex isodose plan is requested to ensure that the target region receives the appropriate radiation dose and that the nearby normal structures continue to be appropriately spared.  The patient will receive 28 gray in 8 fractions at 3.5 gray per fraction to this site.     ________________________________   Jodelle Gross, MD, PhD

## 2014-05-27 ENCOUNTER — Ambulatory Visit: Payer: Medicare PPO | Admitting: Radiation Oncology

## 2014-05-27 ENCOUNTER — Other Ambulatory Visit: Payer: Self-pay | Admitting: *Deleted

## 2014-05-27 ENCOUNTER — Other Ambulatory Visit (HOSPITAL_BASED_OUTPATIENT_CLINIC_OR_DEPARTMENT_OTHER): Payer: Medicare PPO

## 2014-05-27 ENCOUNTER — Ambulatory Visit
Admission: RE | Admit: 2014-05-27 | Discharge: 2014-05-27 | Disposition: A | Discharge status: Home / Self Care | Payer: Medicare PPO | Source: Ambulatory Visit | Attending: Radiation Oncology | Admitting: Radiation Oncology

## 2014-05-27 ENCOUNTER — Telehealth: Payer: Self-pay | Admitting: *Deleted

## 2014-05-27 ENCOUNTER — Encounter: Payer: Self-pay | Admitting: Radiation Oncology

## 2014-05-27 VITALS — BP 115/72 | HR 102 | Resp 18 | Wt 141.0 lb

## 2014-05-27 DIAGNOSIS — C159 Malignant neoplasm of esophagus, unspecified: Secondary | ICD-10-CM

## 2014-05-27 DIAGNOSIS — C7952 Secondary malignant neoplasm of bone marrow: Principal | ICD-10-CM

## 2014-05-27 DIAGNOSIS — E119 Type 2 diabetes mellitus without complications: Secondary | ICD-10-CM

## 2014-05-27 DIAGNOSIS — C7951 Secondary malignant neoplasm of bone: Secondary | ICD-10-CM

## 2014-05-27 LAB — BASIC METABOLIC PANEL (CC13)
ANION GAP: 10 meq/L (ref 3–11)
BUN: 41.7 mg/dL — AB (ref 7.0–26.0)
CALCIUM: 9.7 mg/dL (ref 8.4–10.4)
CO2: 27 mEq/L (ref 22–29)
CREATININE: 0.9 mg/dL (ref 0.7–1.3)
Chloride: 105 mEq/L (ref 98–109)
Glucose: 84 mg/dl (ref 70–140)
POTASSIUM: 3.9 meq/L (ref 3.5–5.1)
Sodium: 141 mEq/L (ref 136–145)

## 2014-05-27 MED ORDER — MORPHINE SULFATE 4 MG/ML IJ SOLN
4.0000 mg | Freq: Once | INTRAMUSCULAR | Status: AC
  Administered 2014-05-27: 4 mg via INTRAMUSCULAR
  Filled 2014-05-27: qty 1

## 2014-05-27 NOTE — Progress Notes (Signed)
Reports right hip pain that radiates down her right leg 6 on a scale of 0-10. Patient had a rod placed for stability 3 weeks ago related to tumor. Reports pain is positional. Denies numbness or tingling in lower extremities. Reports that he forgot to take his percocet prior to coming here for treatment. Using walker today to ambulate. Reports pain in right leg is worse at night. Weight loss noted. Spoke with Dr. Benay Spice this morning reference this matter and J tube is being considered. Edema of left upper lip noted. Mouth sores noted and sore in left nare noted. Reports chemo therapy was discontinued 4 weeks ago. Heart rate slightly elevated. BP stable.

## 2014-05-27 NOTE — Telephone Encounter (Signed)
Spoke with wife (who is a [employer] scientist). She is insisting on Karriem getting J-tube again and patient also wants it placed again. Very difficult to swallow soft foods and is also getting choked on liquids. Wife is concerned about aspiration and him "going into starvation mode". He is also having excess mucous production with constant coughing. His voice is getting hoarse again. He has no appetite and she has to force him to drink liquids. Was able to get #6 Boosts in him yesterday, but she had to constantly nag him. The swallowing difficulty started last week.  She does not see any thrush in his mouth. Dr. Valere Dross started him on Carafate last week. Patient reports that this issue was not discussed with him today in radiation oncology. Requests Dr. Benay Spice to coordinate getting the J-tube placed and tube feedings started. Would like to speak with physician if he needs to discuss it with her.

## 2014-05-27 NOTE — Telephone Encounter (Signed)
Wife left VM that he is having a lot of trouble swallowing and choking a lot with minimal po intake. Says "he's starving". Weight down to 140 lb at home. Asking for J-tube again saying he has had one in the past if MD feels this would help. Comes in today for RT and labs at 2:15.  Left VM with Malachy Mood, RN in radiation oncology asking if nurse/dietician could assess him today to determine if J-tube is necessary.

## 2014-05-27 NOTE — Addendum Note (Signed)
Encounter addended by: Heywood Footman, RN on: 05/27/2014  4:35 PM<BR>     Documentation filed: Notes Section

## 2014-05-27 NOTE — Progress Notes (Signed)
Weekly Management Note:  Site: T2/left clavicle/right femur Current Dose:  1500  cGy Projected Dose: Thousand  cGy  Narrative: The patient is seen today for routine under treatment assessment. CBCT/MVCT images/port films were reviewed. The chart was reviewed.   The patient is seen today having forgot to take 2 Percocet as premedication prior to his treatment today. He is currently taking 20 mg of OxyContin by mouth twice a day. He tells me he takes up to 2 Percocet every 4 hours for breakthrough pain. He is quite uncomfortable today and is not sure that he can hold still because of severe right leg pain (10/10)  Physical Examination:  Filed Vitals:   05/27/14 1504  BP: 115/72  Pulse: 102  Resp: 18  .  Weight: 141 lb (63.957 kg). No change.  Impression: Tolerating radiation therapy well, however, he is quite uncomfortable. I will give him 4 mg of morphine sulfate IM stat and then get him treated.  Plan: Continue radiation therapy as planned.

## 2014-05-27 NOTE — Progress Notes (Signed)
Patient able to endure radiation treatment today. Reports right hip and leg pain 4 on a scale of 0-10 following morphine 4 mg.

## 2014-05-28 ENCOUNTER — Other Ambulatory Visit: Payer: Self-pay | Admitting: *Deleted

## 2014-05-28 ENCOUNTER — Telehealth: Payer: Self-pay | Admitting: Oncology

## 2014-05-28 ENCOUNTER — Ambulatory Visit
Admission: RE | Admit: 2014-05-28 | Discharge: 2014-05-28 | Disposition: A | Discharge status: Home / Self Care | Payer: Medicare PPO | Source: Ambulatory Visit | Attending: Radiation Oncology | Admitting: Radiation Oncology

## 2014-05-28 ENCOUNTER — Telehealth: Payer: Self-pay | Admitting: *Deleted

## 2014-05-28 DIAGNOSIS — C159 Malignant neoplasm of esophagus, unspecified: Secondary | ICD-10-CM

## 2014-05-28 NOTE — Telephone Encounter (Signed)
lvm and sent staff msg to S. Coward to add order for barium swallow

## 2014-05-28 NOTE — Telephone Encounter (Signed)
Entered order as the speech therapist instructed. Call back and left VM that radiology is now asking for another order. Please advise what else needs to be done to get this done?

## 2014-05-28 NOTE — Telephone Encounter (Signed)
Received appointment for swallowing study from  [facility] with rehab department. Scheduled for 6/22 at [facility]. Register at 1345 in radiology department for test at 2pm. Patient notified and agrees.

## 2014-05-28 NOTE — Progress Notes (Signed)
Dr. Benay Spice spoke with wife last night and made her aware J-tube placement would be an open surgical procedure with his condition. Will order swallowing evaluation with barium and speech therapist. Procedure ordered and POF in.

## 2014-05-29 ENCOUNTER — Ambulatory Visit
Admission: RE | Admit: 2014-05-29 | Discharge: 2014-05-29 | Disposition: A | Discharge status: Home / Self Care | Payer: Medicare PPO | Source: Ambulatory Visit | Attending: Radiation Oncology | Admitting: Radiation Oncology

## 2014-05-29 ENCOUNTER — Encounter: Payer: Self-pay | Admitting: Radiation Oncology

## 2014-05-29 ENCOUNTER — Other Ambulatory Visit (HOSPITAL_COMMUNITY): Payer: Self-pay | Admitting: Oncology

## 2014-05-29 ENCOUNTER — Other Ambulatory Visit: Payer: Self-pay | Admitting: Radiation Oncology

## 2014-05-29 VITALS — BP 106/72 | HR 97 | Temp 97.9°F | Resp 20 | Ht 73.0 in | Wt 141.5 lb

## 2014-05-29 DIAGNOSIS — R1310 Dysphagia, unspecified: Secondary | ICD-10-CM

## 2014-05-29 DIAGNOSIS — C7951 Secondary malignant neoplasm of bone: Secondary | ICD-10-CM

## 2014-05-29 DIAGNOSIS — C7952 Secondary malignant neoplasm of bone marrow: Principal | ICD-10-CM

## 2014-05-29 NOTE — Progress Notes (Addendum)
CC: Dr. Shon Baton   Weekly Management Note:  Site: T2-left clavicle/right femur Current Dose:  2400/1050  cGy Projected Dose: 3000/2800  cGy  Narrative: The patient is seen today for routine under treatment assessment. CBCT/MVCT images/port films were reviewed. The chart was reviewed.   The patient is seen today for her worsening soft tissue swelling along his left upper lip. He first noted swelling approximately 3 days ago along with a sore in his left nostril. No fever and no significant drainage.  Physical Examination:  Filed Vitals:   05/29/14 1718  BP: 106/72  Pulse: 97  Temp: 97.9 F (36.6 C)  Resp: 20  .  Weight: 141 lb 8 oz (64.184 kg). There is soft tissue swelling centered along the left upper left with whitish/yellowish discoloration along the left upper lip mucosa. There is erythema along the overlying skin over an area of 4-5 cm.  Impression: Tolerating radiation therapy well, however I believe that he does have a soft tissue abscess that needs further evaluation and possible drainage. I doubt this represents a soft tissue metastasis although this is certainly a possibility.  Plan: I requested that he see his family physician, Dr. Shon Baton to rule out an abscess. He may need referral to ENT. Continue radiation therapy as planned.

## 2014-05-29 NOTE — Progress Notes (Signed)
Matthew White has completed 8 fractions to his T2 and left clavicle and 3 fractions to his right femur.  He asked to be seen today for a sore in his left upper mouth.  He reports it started about 3 days ago.  He said he noticed a sore in his left nostril last week and then the mouth sore started.  He does have a yellow/white ulcer on his left upper cheek.  His left upper lip area is swollen.  He reports the swelling is getting worse.  He does not have any signs of thrush in his mouth.  He completed chemotherapy 2 weeks ago.  He also reports pain in his right hip 3/10 when sitting 7/10 when standing.  He is taking oxycodone 20 mg q 12 hours and percocet and ibuprofen for breath through pain.  He is using a walker today.  He is also taking decadron 4 mg bid.  He reports a poor appetite.  His weight is stable today.

## 2014-05-29 NOTE — Progress Notes (Signed)
Cancelled due to admission

## 2014-05-30 ENCOUNTER — Encounter (HOSPITAL_COMMUNITY): Payer: Self-pay

## 2014-05-30 ENCOUNTER — Ambulatory Visit
Admission: RE | Admit: 2014-05-30 | Discharge: 2014-05-30 | Disposition: A | Discharge status: Home / Self Care | Payer: Medicare PPO | Source: Ambulatory Visit | Attending: Radiation Oncology | Admitting: Radiation Oncology

## 2014-05-30 ENCOUNTER — Ambulatory Visit: Payer: Medicare PPO

## 2014-05-30 ENCOUNTER — Inpatient Hospital Stay (HOSPITAL_COMMUNITY)
Admission: AD | Admit: 2014-05-30 | Discharge: 2014-06-03 | DRG: 137 | Disposition: A | Discharge status: Home Health | Payer: Medicare PPO | Source: Ambulatory Visit | Attending: Internal Medicine | Admitting: Internal Medicine

## 2014-05-30 DIAGNOSIS — L719 Rosacea, unspecified: Secondary | ICD-10-CM | POA: Diagnosis present

## 2014-05-30 DIAGNOSIS — Z85028 Personal history of other malignant neoplasm of stomach: Secondary | ICD-10-CM

## 2014-05-30 DIAGNOSIS — T4275XA Adverse effect of unspecified antiepileptic and sedative-hypnotic drugs, initial encounter: Secondary | ICD-10-CM | POA: Diagnosis not present

## 2014-05-30 DIAGNOSIS — Z8261 Family history of arthritis: Secondary | ICD-10-CM

## 2014-05-30 DIAGNOSIS — C7951 Secondary malignant neoplasm of bone: Secondary | ICD-10-CM | POA: Diagnosis present

## 2014-05-30 DIAGNOSIS — N486 Induration penis plastica: Secondary | ICD-10-CM | POA: Diagnosis present

## 2014-05-30 DIAGNOSIS — Z9221 Personal history of antineoplastic chemotherapy: Secondary | ICD-10-CM

## 2014-05-30 DIAGNOSIS — E43 Unspecified severe protein-calorie malnutrition: Secondary | ICD-10-CM | POA: Diagnosis present

## 2014-05-30 DIAGNOSIS — G893 Neoplasm related pain (acute) (chronic): Secondary | ICD-10-CM | POA: Diagnosis present

## 2014-05-30 DIAGNOSIS — M549 Dorsalgia, unspecified: Secondary | ICD-10-CM

## 2014-05-30 DIAGNOSIS — M84551A Pathological fracture in neoplastic disease, right femur, initial encounter for fracture: Secondary | ICD-10-CM

## 2014-05-30 DIAGNOSIS — Z8249 Family history of ischemic heart disease and other diseases of the circulatory system: Secondary | ICD-10-CM

## 2014-05-30 DIAGNOSIS — Z923 Personal history of irradiation: Secondary | ICD-10-CM

## 2014-05-30 DIAGNOSIS — Z79899 Other long term (current) drug therapy: Secondary | ICD-10-CM

## 2014-05-30 DIAGNOSIS — C7800 Secondary malignant neoplasm of unspecified lung: Secondary | ICD-10-CM | POA: Diagnosis present

## 2014-05-30 DIAGNOSIS — Z791 Long term (current) use of non-steroidal anti-inflammatories (NSAID): Secondary | ICD-10-CM

## 2014-05-30 DIAGNOSIS — Z66 Do not resuscitate: Secondary | ICD-10-CM | POA: Diagnosis present

## 2014-05-30 DIAGNOSIS — Z681 Body mass index (BMI) 19 or less, adult: Secondary | ICD-10-CM

## 2014-05-30 DIAGNOSIS — Z833 Family history of diabetes mellitus: Secondary | ICD-10-CM

## 2014-05-30 DIAGNOSIS — F1010 Alcohol abuse, uncomplicated: Secondary | ICD-10-CM | POA: Diagnosis present

## 2014-05-30 DIAGNOSIS — G2581 Restless legs syndrome: Secondary | ICD-10-CM | POA: Diagnosis present

## 2014-05-30 DIAGNOSIS — Z794 Long term (current) use of insulin: Secondary | ICD-10-CM

## 2014-05-30 DIAGNOSIS — R1310 Dysphagia, unspecified: Secondary | ICD-10-CM

## 2014-05-30 DIAGNOSIS — I959 Hypotension, unspecified: Secondary | ICD-10-CM | POA: Diagnosis not present

## 2014-05-30 DIAGNOSIS — Z87311 Personal history of (healed) other pathological fracture: Secondary | ICD-10-CM

## 2014-05-30 DIAGNOSIS — K130 Diseases of lips: Secondary | ICD-10-CM

## 2014-05-30 DIAGNOSIS — R64 Cachexia: Secondary | ICD-10-CM | POA: Diagnosis present

## 2014-05-30 DIAGNOSIS — K219 Gastro-esophageal reflux disease without esophagitis: Secondary | ICD-10-CM | POA: Diagnosis present

## 2014-05-30 DIAGNOSIS — R1312 Dysphagia, oropharyngeal phase: Secondary | ICD-10-CM | POA: Diagnosis present

## 2014-05-30 DIAGNOSIS — C159 Malignant neoplasm of esophagus, unspecified: Secondary | ICD-10-CM

## 2014-05-30 DIAGNOSIS — Z8349 Family history of other endocrine, nutritional and metabolic diseases: Secondary | ICD-10-CM

## 2014-05-30 DIAGNOSIS — M25559 Pain in unspecified hip: Secondary | ICD-10-CM

## 2014-05-30 DIAGNOSIS — C7952 Secondary malignant neoplasm of bone marrow: Secondary | ICD-10-CM

## 2014-05-30 DIAGNOSIS — K122 Cellulitis and abscess of mouth: Principal | ICD-10-CM | POA: Diagnosis present

## 2014-05-30 DIAGNOSIS — R269 Unspecified abnormalities of gait and mobility: Secondary | ICD-10-CM | POA: Diagnosis present

## 2014-05-30 DIAGNOSIS — D489 Neoplasm of uncertain behavior, unspecified: Secondary | ICD-10-CM

## 2014-05-30 DIAGNOSIS — L0201 Cutaneous abscess of face: Secondary | ICD-10-CM | POA: Diagnosis present

## 2014-05-30 DIAGNOSIS — R4181 Age-related cognitive decline: Secondary | ICD-10-CM | POA: Diagnosis present

## 2014-05-30 DIAGNOSIS — L8991 Pressure ulcer of unspecified site, stage 1: Secondary | ICD-10-CM | POA: Diagnosis present

## 2014-05-30 DIAGNOSIS — Z91041 Radiographic dye allergy status: Secondary | ICD-10-CM

## 2014-05-30 DIAGNOSIS — C155 Malignant neoplasm of lower third of esophagus: Secondary | ICD-10-CM | POA: Diagnosis present

## 2014-05-30 DIAGNOSIS — R627 Adult failure to thrive: Secondary | ICD-10-CM | POA: Diagnosis present

## 2014-05-30 DIAGNOSIS — L8992 Pressure ulcer of unspecified site, stage 2: Secondary | ICD-10-CM | POA: Diagnosis present

## 2014-05-30 DIAGNOSIS — R252 Cramp and spasm: Secondary | ICD-10-CM | POA: Diagnosis present

## 2014-05-30 DIAGNOSIS — L89109 Pressure ulcer of unspecified part of back, unspecified stage: Secondary | ICD-10-CM | POA: Diagnosis present

## 2014-05-30 DIAGNOSIS — K5900 Constipation, unspecified: Secondary | ICD-10-CM | POA: Diagnosis not present

## 2014-05-30 DIAGNOSIS — E119 Type 2 diabetes mellitus without complications: Secondary | ICD-10-CM

## 2014-05-30 DIAGNOSIS — Z87891 Personal history of nicotine dependence: Secondary | ICD-10-CM

## 2014-05-30 DIAGNOSIS — E785 Hyperlipidemia, unspecified: Secondary | ICD-10-CM | POA: Diagnosis present

## 2014-05-30 DIAGNOSIS — A4902 Methicillin resistant Staphylococcus aureus infection, unspecified site: Secondary | ICD-10-CM | POA: Diagnosis present

## 2014-05-30 DIAGNOSIS — R2681 Unsteadiness on feet: Secondary | ICD-10-CM | POA: Diagnosis present

## 2014-05-30 DIAGNOSIS — Z800 Family history of malignant neoplasm of digestive organs: Secondary | ICD-10-CM

## 2014-05-30 DIAGNOSIS — M109 Gout, unspecified: Secondary | ICD-10-CM | POA: Diagnosis present

## 2014-05-30 DIAGNOSIS — Z8049 Family history of malignant neoplasm of other genital organs: Secondary | ICD-10-CM

## 2014-05-30 LAB — CBC
HCT: 31.7 % — ABNORMAL LOW (ref 39.0–52.0)
HEMOGLOBIN: 10.7 g/dL — AB (ref 13.0–17.0)
MCH: 33.4 pg (ref 26.0–34.0)
MCHC: 33.8 g/dL (ref 30.0–36.0)
MCV: 99.1 fL (ref 78.0–100.0)
PLATELETS: 271 10*3/uL (ref 150–400)
RBC: 3.2 MIL/uL — AB (ref 4.22–5.81)
RDW: 20.5 % — ABNORMAL HIGH (ref 11.5–15.5)
WBC: 12.8 10*3/uL — AB (ref 4.0–10.5)

## 2014-05-30 LAB — COMPREHENSIVE METABOLIC PANEL
ALBUMIN: 2.8 g/dL — AB (ref 3.5–5.2)
ALK PHOS: 91 U/L (ref 39–117)
ALT: 9 U/L (ref 0–53)
AST: 12 U/L (ref 0–37)
BUN: 63 mg/dL — AB (ref 6–23)
CALCIUM: 9.7 mg/dL (ref 8.4–10.5)
CO2: 24 mEq/L (ref 19–32)
CREATININE: 1.14 mg/dL (ref 0.50–1.35)
Chloride: 102 mEq/L (ref 96–112)
GFR calc non Af Amer: 60 mL/min — ABNORMAL LOW (ref >90)
GFR, EST AFRICAN AMERICAN: 70 mL/min — AB (ref >90)
GLUCOSE: 105 mg/dL — AB (ref 70–99)
Potassium: 4.1 mEq/L (ref 3.7–5.3)
Sodium: 139 mEq/L (ref 137–147)
Total Bilirubin: 0.3 mg/dL (ref 0.3–1.2)
Total Protein: 6.3 g/dL (ref 6.0–8.3)

## 2014-05-30 LAB — TSH: TSH: 1.27 u[IU]/mL (ref 0.350–4.500)

## 2014-05-30 LAB — PHOSPHORUS: PHOSPHORUS: 3.2 mg/dL (ref 2.3–4.6)

## 2014-05-30 LAB — GLUCOSE, CAPILLARY: Glucose-Capillary: 93 mg/dL (ref 70–99)

## 2014-05-30 LAB — APTT: APTT: 25 s (ref 24–37)

## 2014-05-30 LAB — PROTIME-INR
INR: 1.01 (ref 0.00–1.49)
PROTHROMBIN TIME: 13.1 s (ref 11.6–15.2)

## 2014-05-30 LAB — HEMOGLOBIN A1C
Hgb A1c MFr Bld: 6.4 % — ABNORMAL HIGH (ref <5.7)
Mean Plasma Glucose: 137 mg/dL — ABNORMAL HIGH (ref <117)

## 2014-05-30 LAB — MAGNESIUM: MAGNESIUM: 2.1 mg/dL (ref 1.5–2.5)

## 2014-05-30 LAB — MRSA PCR SCREENING: MRSA by PCR: POSITIVE — AB

## 2014-05-30 MED ORDER — CHLORHEXIDINE GLUCONATE CLOTH 2 % EX PADS
6.0000 | MEDICATED_PAD | Freq: Every day | CUTANEOUS | Status: DC
  Administered 2014-05-31 – 2014-06-02 (×3): 6 via TOPICAL

## 2014-05-30 MED ORDER — ONDANSETRON HCL 4 MG PO TABS: 4.0000 mg | ORAL_TABLET | Freq: Four times a day (QID) | ORAL | Status: DC | PRN

## 2014-05-30 MED ORDER — INSULIN ASPART 100 UNIT/ML ~~LOC~~ SOLN
0.0000 [IU] | Freq: Four times a day (QID) | SUBCUTANEOUS | Status: DC
  Administered 2014-05-31: 1 [IU] via SUBCUTANEOUS
  Administered 2014-05-31: 3 [IU] via SUBCUTANEOUS

## 2014-05-30 MED ORDER — MORPHINE SULFATE 2 MG/ML IJ SOLN
INTRAMUSCULAR | Status: AC
  Filled 2014-05-30: qty 1

## 2014-05-30 MED ORDER — CLINIMIX E/DEXTROSE (5/15) 5 % IV SOLN
INTRAVENOUS | Status: AC
  Administered 2014-05-30: 18:00:00 via INTRAVENOUS
  Filled 2014-05-30: qty 1000

## 2014-05-30 MED ORDER — INSULIN ASPART 100 UNIT/ML ~~LOC~~ SOLN: 0.0000 [IU] | Freq: Three times a day (TID) | SUBCUTANEOUS | Status: DC

## 2014-05-30 MED ORDER — ACETAMINOPHEN 325 MG PO TABS: 650.0000 mg | ORAL_TABLET | Freq: Four times a day (QID) | ORAL | Status: DC | PRN

## 2014-05-30 MED ORDER — PANTOPRAZOLE SODIUM 40 MG PO TBEC
40.0000 mg | DELAYED_RELEASE_TABLET | Freq: Every day | ORAL | Status: DC
  Administered 2014-05-31 – 2014-06-03 (×4): 40 mg via ORAL
  Filled 2014-05-30 (×5): qty 1

## 2014-05-30 MED ORDER — VANCOMYCIN HCL IN DEXTROSE 750-5 MG/150ML-% IV SOLN
750.0000 mg | Freq: Two times a day (BID) | INTRAVENOUS | Status: DC
  Administered 2014-05-30 – 2014-06-03 (×9): 750 mg via INTRAVENOUS
  Filled 2014-05-30 (×9): qty 150

## 2014-05-30 MED ORDER — MUPIROCIN 2 % EX OINT
1.0000 | TOPICAL_OINTMENT | Freq: Two times a day (BID) | CUTANEOUS | Status: DC
Start: 2014-05-30 — End: 2014-06-03
  Administered 2014-05-30 – 2014-06-03 (×8): 1 via NASAL
  Filled 2014-05-30: qty 22

## 2014-05-30 MED ORDER — ALPRAZOLAM 0.25 MG PO TABS: 0.2500 mg | ORAL_TABLET | Freq: Two times a day (BID) | ORAL | Status: DC | PRN

## 2014-05-30 MED ORDER — BIOTENE DRY MOUTH MT LIQD
15.0000 mL | Freq: Two times a day (BID) | OROMUCOSAL | Status: DC
  Administered 2014-05-31 – 2014-06-03 (×7): 15 mL via OROMUCOSAL

## 2014-05-30 MED ORDER — SODIUM CHLORIDE 0.9 % IV SOLN
INTRAVENOUS | Status: DC
  Administered 2014-05-30 – 2014-05-31 (×2): via INTRAVENOUS

## 2014-05-30 MED ORDER — ONDANSETRON HCL 4 MG/2ML IJ SOLN: 4.0000 mg | Freq: Four times a day (QID) | INTRAMUSCULAR | Status: DC | PRN

## 2014-05-30 MED ORDER — OXYCODONE-ACETAMINOPHEN 5-325 MG PO TABS: 1.0000 | ORAL_TABLET | Freq: Four times a day (QID) | ORAL | Status: DC | PRN

## 2014-05-30 MED ORDER — CHLORHEXIDINE GLUCONATE 0.12 % MT SOLN
15.0000 mL | Freq: Two times a day (BID) | OROMUCOSAL | Status: DC
  Administered 2014-05-30 – 2014-06-03 (×8): 15 mL via OROMUCOSAL
  Filled 2014-05-30 (×10): qty 15

## 2014-05-30 MED ORDER — PIPERACILLIN-TAZOBACTAM 3.375 G IVPB 30 MIN
3.3750 g | Freq: Three times a day (TID) | INTRAVENOUS | Status: AC
  Administered 2014-05-30 – 2014-05-31 (×5): 3.375 g via INTRAVENOUS
  Filled 2014-05-30 (×5): qty 50

## 2014-05-30 MED ORDER — MORPHINE SULFATE 2 MG/ML IJ SOLN
1.0000 mg | INTRAMUSCULAR | Status: DC | PRN
  Administered 2014-05-30 – 2014-05-31 (×3): 1 mg via INTRAVENOUS

## 2014-05-30 MED ORDER — FAT EMULSION 20 % IV EMUL
250.0000 mL | INTRAVENOUS | Status: AC
  Administered 2014-05-30: 250 mL via INTRAVENOUS
  Filled 2014-05-30: qty 250

## 2014-05-30 MED ORDER — OXYCODONE HCL ER 20 MG PO T12A
20.0000 mg | EXTENDED_RELEASE_TABLET | Freq: Two times a day (BID) | ORAL | Status: DC
Start: 2014-05-30 — End: 2014-05-30

## 2014-05-30 MED ORDER — OXYCODONE HCL ER 20 MG PO T12A
20.0000 mg | EXTENDED_RELEASE_TABLET | Freq: Two times a day (BID) | ORAL | Status: DC
  Administered 2014-05-30 – 2014-06-03 (×8): 20 mg via ORAL
  Filled 2014-05-30 (×8): qty 1

## 2014-05-30 MED ORDER — THIAMINE HCL 100 MG/ML IJ SOLN
Freq: Once | INTRAVENOUS | Status: AC
  Administered 2014-05-30: 15:00:00 via INTRAVENOUS
  Filled 2014-05-30: qty 1000

## 2014-05-30 MED ORDER — ACETAMINOPHEN 650 MG RE SUPP: 650.0000 mg | Freq: Four times a day (QID) | RECTAL | Status: DC | PRN

## 2014-05-30 MED ORDER — LORAZEPAM 2 MG/ML IJ SOLN
1.0000 mg | Freq: Four times a day (QID) | INTRAMUSCULAR | Status: DC | PRN
  Administered 2014-05-30 – 2014-05-31 (×2): 1 mg via INTRAVENOUS
  Filled 2014-05-30 (×2): qty 1

## 2014-05-30 MED ORDER — DEXAMETHASONE 4 MG PO TABS
4.0000 mg | ORAL_TABLET | Freq: Two times a day (BID) | ORAL | Status: DC
  Administered 2014-05-30 – 2014-06-03 (×8): 4 mg via ORAL
  Filled 2014-05-30 (×10): qty 1

## 2014-05-30 NOTE — Progress Notes (Signed)
INITIAL NUTRITION ASSESSMENT  DOCUMENTATION CODES Per approved criteria  -Severe malnutrition in the context of chronic illness  Pt meets criteria for severe MALNUTRITION in the context of chronic illness as evidenced by severe muscle wasting and subcutaneous fat loss, 9.6% body weight loss in < 3 months.   INTERVENTION: -TPN per pharmacy -Diet advancement per MD -RD to follow up for nutrition education needs  NUTRITION DIAGNOSIS: Inadequate oral intake related to inability to eat as evidenced by NPO status.   Goal: Pt to meet >/= 90% of their estimated nutrition needs    Monitor:  TPN tolerance, total protein/energy intake, diet order, swallow profile, labs, weights  Reason for Assessment: Consult/New TPN  80 y.o. male  Admitting Dx: <principal problem not specified>  ASSESSMENT: The patient is seen today for worsening soft tissue swelling along his left upper lip. He first noted swelling approximately 3 days ago along with a sore in his left nostril  -Pt endorsed ongoing unintentional wt loss d/t decreased appetite, loose stools, and difficulty swallowing -Prior to cancer dx, pt weighed around 200 lbs. Weight declined with chemo and radiation treatments to 180 lbs. Was able to maintain weight with J-tube. J-tube was removed, which resulted in further weight loss to around 155 lbs. Pt had been able to maintain weight until recently, whereas he has lost 15 lbs in 3 months (9.6% body weight, severe for time frame) -Pt requires puree-like foods d/t esophageal surgeries. Diet recall indicates pt trying to consume 5-6 Boost Plus/daily with small amounts of soft foods-yogurt, mashed potatoes, soups, oatmeal. However, pt also reported ongoing loose stools, therefore questionable to how much of nutrients are being absorbed -Currently NPO. Will f/u with pt and pt's wife for supplement coupons and nutrition supplement recipes for additional resources -Pt to begin on TPN. Phos/Mg/K  WNL -Nutrition Focused Physical Exam:  Subcutaneous Fat:  Orbital Region: WDL Upper Arm Region: severe wasting Thoracic and Lumbar Region: n/a  Muscle:  Temple Region: severe wasting Clavicle Bone Region: severe wasting Clavicle and Acromion Bone Region: severe wasting Scapular Bone Region: n/a Dorsal Hand: moderate wasting Patellar Region: moderate wasting Anterior Thigh Region: severe wasting  Posterior Calf Region: moderate wasting  Edema: none noted    Height: Ht Readings from Last 1 Encounters:  05/30/14 6\' 1"  (1.854 m)    Weight: Wt Readings from Last 1 Encounters:  05/30/14 140 lb 8 oz (63.73 kg)   Ideal Body Weight: 184 lbs  % Ideal Body Weight: 76%  Wt Readings from Last 10 Encounters:  05/30/14 140 lb 8 oz (63.73 kg)  05/29/14 141 lb 8 oz (64.184 kg)  05/27/14 141 lb (63.957 kg)  05/24/14 144 lb 12.8 oz (65.681 kg)  05/20/14 150 lb 4.8 oz (68.176 kg)  05/17/14 152 lb 11.2 oz (69.264 kg)  05/03/14 155 lb (70.308 kg)  05/03/14 155 lb (70.308 kg)  04/29/14 155 lb 6.4 oz (70.489 kg)  04/08/14 158 lb 14.4 oz (72.077 kg)    Usual Body Weight: 180 lbs  % Usual Body Weight: 77%  BMI:  Body mass index is 18.54 kg/(m^2).  Estimated Nutritional Needs: Kcal: 2200-2400 Protein: 115-125 gram Fluid: 2200 ml/daily  Skin: stage 1 PU on sacrum  Diet Order: NPO  EDUCATION NEEDS: -Education not appropriate at this time  No intake or output data in the 24 hours ending 05/30/14 1643  Last BM: 6/17   Labs:   Recent Labs Lab 05/27/14 1434 05/30/14 1530  NA 141 139  K 3.9 4.1  CL  --  102  CO2 27 24  BUN 41.7* 63*  CREATININE 0.9 1.14  CALCIUM 9.7 9.7  MG  --  2.1  PHOS  --  3.2  GLUCOSE 84 105*    CBG (last 3)  No results found for this basename: GLUCAP,  in the last 72 hours  Scheduled Meds: . antiseptic oral rinse  15 mL Mouth Rinse q12n4p  . chlorhexidine  15 mL Mouth Rinse BID  . dexamethasone  4 mg Oral Q12H  . insulin aspart  0-9  Units Subcutaneous TID WC  . OxyCODONE  20 mg Oral Q12H  . pantoprazole  40 mg Oral Daily  . piperacillin-tazobactam  3.375 g Intravenous 3 times per day  . vancomycin  750 mg Intravenous Q12H    Continuous Infusions: . sodium chloride 50 mL/hr at 05/30/14 1517    Past Medical History  Diagnosis Date  . Gout     no flare up in ten years   . Barrett esophagus   . Esophageal cancer 12/22/2012    invasive adenocarcinoma arising in a background of barrett's mucosa  . Diabetes mellitus without complication     type II; last A1C 6.5 10/2012  . GERD (gastroesophageal reflux disease)     managed by prilosec  . Hx of pleural effusion 10/2013    left. pt states it was drained at [facility].     Past Surgical History  Procedure Laterality Date  . Nissen fundoplication    . Tonsillectomy    . Laryngoscopy    . Esophagogastroduodenoscopy  10/18/2012    Procedure: ESOPHAGOGASTRODUODENOSCOPY (EGD);  Surgeon: Winfield Cunas., MD;  Location: [facility];  Service: Endoscopy;  Laterality: N/A;  need xray  . Savory dilation  10/18/2012    Procedure: SAVORY DILATION;  Surgeon: Winfield Cunas., MD;  Location: [facility];  Service: Endoscopy;  Laterality: N/A;  . Esophagogastroduodenoscopy  12/22/2012    Procedure: ESOPHAGOGASTRODUODENOSCOPY (EGD);  Surgeon: Winfield Cunas., MD;  Location: [facility];  Service: Endoscopy;  Laterality: N/A;  with C-Arm  . Savory dilation  12/22/2012    Procedure: SAVORY DILATION;  Surgeon: Winfield Cunas., MD;  Location: [facility];  Service: Endoscopy;  Laterality: N/A;  . Eus  01/10/2013    Procedure: ESOPHAGEAL ENDOSCOPIC ULTRASOUND (EUS) RADIAL;  Surgeon: Arta Silence, MD;  Location: [facility];  Service: Endoscopy;  Laterality: N/A;  . Removal of breast tissue    . Esophagectomy  04/20/2013  . Larynx surgery  11/2013    LARYNGEAL REPAIR   . Cholecystectomy    . Femur im nail Right 05/03/2014    Procedure: INTRAMEDULLARY (IM) NAIL FEMORAL;   Surgeon: Alta Corning, MD;  Location: [facility];  Service: Orthopedics;  Laterality: Right;    Atlee Abide MS RD [place] Clinical Dietitian [phone]

## 2014-05-30 NOTE — Consult Note (Signed)
[place]  [phone]   HEMATOLOGY ONCOLOGY INPATIENT NOTE  SHAYN MADOLE  DOB: Jun 20, 1934  MR#: [id number]  CSN#: [id number]    Requesting Physician: [facility]   Primary MD:  Shon Baton, MD  Reason for Consult: Esophageal Cancer  Mr. Fenter is a 80 year old man patient of Dr. Benay Spice with a history of adenocarcinoma of the esophagus  (please see details below)  Admitted today with left soft tissue facial abscess present for the last 4 days with nasal drainage, with pain not relieved by over the counter meds. He is fatigued, unable to eat well due to the pain. He complains of hoarseness,  intermittent diarrhea and constipation.  He is finishing T2-left clavicle/right femur radiation under Dr. Charlton Amor direction in 2 days and has  right hip radiation therapy 5 more treatments.  While hospitalist team is tending to his primary needs, we have been kindly informed of the patient's admission to discuss his long-term prognosis. Rest of the ROS negative other than chronic back pain He is in Vancomycin. Cultures pending. ENT to see (Dr. 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  -Status post an esophagogastrectomy 04/20/2013 with the pathology confirming a pT1b,pN1 tumor, grade 1 with negative surgical margins.  2. History of Barrett's esophagus.  3. Adult onset diabetes.  4. Odynophagia/early satiety. The odynophagia is better.  5. Postoperative hoarseness-status post collagen injection therapy at [facility].  6. Dysphagia progressive over the past 2-4 months. He is sheduled for a dilatation procedure in the near future.  7. Chest CT 02/14/2014 showed a new mass like partially calcified lesion in the right lower lobe peripherally. There  were some new lower lobe reticulonodular opacities. A peripherally located right lower lobe superior segment nodule was larger.  PET scan 02/19/2014 with evidence of multiple hypermetabolic lung nodules, hypermetabolic bone lesions, and a hypermetabolic focus at the left side of the prostate  CT biopsy of a right lower lobe subpleural nodule 03/07/2014 confirmed metastatic adenocarcinoma consistent with esophagus cancer.  Cycle 1 CAPOX beginning 03/18/2014 8. Port-A-Cath placement 03/11/2014  9.severe pain at the left clavicle after a fall, plain x-ray of the left clavicle 03/18/2014 confirmed a lytic lesion of the medial left clavicle, PET scan at [facility] 02/19/2014 revealed a hypermetabolic medial left clavicle lesion  10. Constipation-likely secondary to narcotics    Other Past medical history:      Past Medical History  Diagnosis Date  . Gout     no flare up in ten years   . Barrett esophagus   . Esophageal cancer 12/22/2012    invasive adenocarcinoma arising in a background of barrett's mucosa  . Diabetes mellitus without complication     type II; last A1C 6.5 10/2012  . GERD (gastroesophageal reflux disease)     managed by prilosec  . Hx of pleural effusion 10/2013    left. pt states it was drained at [facility].     Past surgical history:      Past Surgical History  Procedure Laterality Date  . Nissen fundoplication    . Tonsillectomy    . Laryngoscopy    . Esophagogastroduodenoscopy  10/18/2012    Procedure: ESOPHAGOGASTRODUODENOSCOPY (EGD);  Surgeon: Winfield Cunas., MD;  Location: [facility];  Service: Endoscopy;  Laterality: N/A;  need xray  .  Savory dilation  10/18/2012    Procedure: SAVORY DILATION;  Surgeon: Winfield Cunas., MD;  Location: [facility];  Service: Endoscopy;  Laterality: N/A;  . Esophagogastroduodenoscopy  12/22/2012    Procedure: ESOPHAGOGASTRODUODENOSCOPY (EGD);  Surgeon: Winfield Cunas., MD;  Location: [facility];  Service: Endoscopy;   Laterality: N/A;  with C-Arm  . Savory dilation  12/22/2012    Procedure: SAVORY DILATION;  Surgeon: Winfield Cunas., MD;  Location: [facility];  Service: Endoscopy;  Laterality: N/A;  . Eus  01/10/2013    Procedure: ESOPHAGEAL ENDOSCOPIC ULTRASOUND (EUS) RADIAL;  Surgeon: Arta Silence, MD;  Location: [facility];  Service: Endoscopy;  Laterality: N/A;  . Removal of breast tissue    . Esophagectomy  04/20/2013  . Larynx surgery  11/2013    LARYNGEAL REPAIR   . Cholecystectomy    . Femur im nail Right 05/03/2014    Procedure: INTRAMEDULLARY (IM) NAIL FEMORAL;  Surgeon: Alta Corning, MD;  Location: [facility];  Service: Orthopedics;  Laterality: Right;    Medications:  Prior to Admission:  Prescriptions prior to admission  Medication Sig Dispense Refill  . dexamethasone (DECADRON) 4 MG tablet Take 4 mg by mouth 2 (two) times daily with a meal.      . docusate sodium (COLACE) 100 MG capsule Take 100 mg by mouth 2 (two) times daily as needed for mild constipation.       . ENSURE (ENSURE) Take 237 mLs by mouth 4 (four) times daily as needed (for supplement.  Will use Boost or Ensure.).       Marland Kitchen ibuprofen (ADVIL,MOTRIN) 200 MG tablet Take 200 mg by mouth 2 (two) times daily as needed for mild pain.       Marland Kitchen insulin glargine (LANTUS) 100 UNIT/ML injection Inject 10 Units into the skin at bedtime.      . insulin lispro (HUMALOG) 100 UNIT/ML injection Inject 0-8 Units into the skin every evening. Patient uses a sliding scale from 0-8 units      . lactose free nutrition (BOOST) LIQD Take 237 mLs by mouth 3 (three) times daily between meals.      . lidocaine-prilocaine (EMLA) cream Apply 1 application topically as needed (for port).       Marland Kitchen loperamide (IMODIUM A-D) 2 MG tablet Take 2 mg by mouth 4 (four) times daily as needed for diarrhea or loose stools.      . Omega-3 Fatty Acids (FISH OIL PO) Take 1 capsule by mouth 2 (two) times daily.      Marland Kitchen omeprazole (PRILOSEC) 20 MG capsule Take 20 mg by  mouth daily as needed (for acid reflux).       . OxyCODONE (OXYCONTIN) 20 mg T12A 12 hr tablet Take 1 tablet (20 mg total) by mouth every 12 (twelve) hours.  60 tablet  0  . oxyCODONE-acetaminophen (PERCOCET/ROXICET) 5-325 MG per tablet Take 2 tablets by mouth every 4 (four) hours as needed for severe pain.  100 tablet  0  . polyethylene glycol (MIRALAX / GLYCOLAX) packet Take 17 g by mouth daily as needed for mild constipation.       . prochlorperazine (COMPAZINE) 5 MG tablet Take 1 tablet (5 mg total) by mouth every 6 (six) hours as needed for nausea or vomiting.  30 tablet  2    HYW:VPXTGGYIRSWNI, acetaminophen, ALPRAZolam, morphine injection, ondansetron (ZOFRAN) IV, ondansetron, oxyCODONE-acetaminophen  Allergies:  Allergies  Allergen Reactions  . Ivp Dye [Iodinated Diagnostic Agents] Swelling  13 hr prep/reaction was 20 years ago per patient    Family history:     Family History  Problem Relation Age of Onset  . Cancer Father     pancreatic cancer; questionable miss dx  . Dementia Father   . Cancer Mother     cervical                                        Social history:    History   Social History  . Marital Status: Married    Spouse Name: [name]    Number of Children: 4  . Years of Education: N/A   Occupational History  . retired    Social History Main Topics  . Smoking status: Former Smoker    Start date: 12/13/1954    Quit date: 10/19/1983  . Smokeless tobacco: Never Used  . Alcohol Use: No     Comment: stopped drinking in 1985  . Drug Use: No  . Sexual Activity: No   Other Topics Concern  . Not on file   Social History Narrative   Retired from Restaurant manager, fast food   Married to Pine (retired Marine scientist)   #4 children and have gran children and great grandchildren     ROS: See HPI otherwise negative    Physical Exam     Filed Vitals:   05/30/14 1457  BP: 109/61  Pulse: 91  Temp: 97.7 F (36.5 C)  Resp: 18   Filed  Weights   05/30/14 1457  Weight: 140 lb 8 oz (63.73 kg)    GENERAL:alert, no distress and comfortable SKIN: skin color, texture, turgor are normal, no rashes or significant lesions EYES: normal, conjunctiva are pink and non-injected, sclera clear OROPHARYNX:no exudate, no erythema and lips, buccal mucosa, and tongue normal . Left above the left upper lip abscess of about 4 cm, with some erythema,  which  draining into nose. NECK: supple, thyroid normal size, non-tender, without nodularity LYMPH:  no palpable lymphadenopathy in the cervical, axillary or inguinal LUNGS: clear to auscultation and percussion with normal breathing effort HEART: regular rate & rhythm and no murmurs and no lower extremity edema ABDOMEN:abdomen soft, non-tender and normal bowel sounds Musculoskeletal:no cyanosis of digits and no clubbing  PSYCH: alert & oriented x 3 with fluent speech NEURO: no focal motor/sensory deficits       CBC  Recent Labs Lab 05/30/14 1530  WBC 12.8*  HGB 10.7*  HCT 31.7*  PLT 271  MCV 99.1  MCH 33.4  MCHC 33.8  RDW 20.5*    Anemia panel:  No results found for this basename: VITAMINB12, FOLATE, FERRITIN, TIBC, IRON, RETICCTPCT,  in the last 72 hours   Chemistries   Recent Labs Lab 05/27/14 1434  NA 141  K 3.9  CO2 27  GLUCOSE 84  BUN 41.7*  CREATININE 0.9  CALCIUM 9.7     Coagulation profile  Recent Labs Lab 05/30/14 1530  INR 1.01    Urine Studies No results found for this basename: UACOL, UAPR, USPG, UPH, UTP, UGL, UKET, UBIL, UHGB, UNIT, UROB, ULEU, UEPI, UWBC, URBC, UBAC, CAST, CRYS, UCOM, BILUA,  in the last 72 hours  Studies:      Dg Femur Right  05/03/2014   CLINICAL DATA:  ORIF of a right proximal femur fracture  EXAM: DG C-ARM 61-120 MIN; RIGHT FEMUR - 2 VIEW  FINDINGS: Images show placement  of a long intra medullary rod supporting a compression screw reducing the intertrochanteric fracture fragments into near anatomic alignment. No acute  fracture. No evidence of an operative complication.  IMPRESSION: Right proximal femur fracture ORIF. Fracture fragments well aligned.   Electronically Signed   By: Lajean Manes M.D.   On: 05/03/2014 15:44   Ct Chest Wo Contrast  05/29/2014   ADDENDUM REPORT: 05/29/2014 17:10  ADDENDUM: There is a PET-CT from [facility] dated 02/19/2014 which available on PACs. This was mislabeled as to title which is why it was not reviewed at the time of initial interpretation. Reviewing the PET-CT from [facility] of 02/19/2014, the left clavicle lesion and the T2 vertebral body lesion are evident on the CT images, but both lesions are tiny on 02/19/14. These have progressed in the interval. The lesion in the medial left clavicle measure 1.4 x 0.9 cm on 02/19/2014 compared to 4.4 x 3.5 cm on this study. The lesion in the T2 vertebral body measured about 5 x 5 mm on the 02/19/2014 exam compared to 3.1 x 3.8 cm on this study. Even in retrospect, the posterior left tenth rib lesion seen on the current study is not visible on CT images of the [facility] exam. The other scattered tiny lesions described in the report for this study are not visible on the CT images for prior exam. So, in general, while multiple areas of hypermetabolism are seen within the skeleton on the previous [facility] CT, many of those lesions had no CT correlate. Since 02/19/2014, the lesions have become quite apparent on CT.   Electronically Signed   By: Misty Stanley M.D.   On: 05/29/2014 17:10   05/29/2014   CLINICAL DATA:  Esophageal cancer.  EXAM: CT CHEST WITHOUT CONTRAST  TECHNIQUE: Multidetector CT imaging of the chest was performed following the standard protocol without IV contrast.  COMPARISON:  02/19/2014 PET-CT from [facility]. CT chest 12/29/2012.  FINDINGS: Soft tissue / Mediastinum: The tip of the right-sided Port-A-Cath is positioned at the junction of the SVC and RA.  There is no axillary lymphadenopathy. Patient is status post gastric pull-through  procedure. No evidence for mediastinal lymphadenopathy. No bulky hilar lymphadenopathy. Heart size is normal. Coronary artery calcification is noted. No pericardial effusion.  Lung windows show emphysema in the upper lobes. 19 mm right lower lobe pulmonary nodule on image 38 measured 15 mm on the recent PET-CT. 4.5 cm focus of masslike opacity in the posterior right lower lobe inferiorly measures 3.7 cm today and is much less confluent on today's exam band previously. 2.5 cm subpleural nodule in the lingula on today's study was 2.5 cm on the previous exam. There is evidence of volume loss in the left hemi thorax with areas of atelectasis noted in the lower lobe.  Bones: A large lytic lesion essentially replaces the T2 vertebral body. This is new in the interval. There is evidence for epidural tumor spread into the anterior spinal canal at this level. 4.4 x 3.5 cm lesion in the head of the left clavicle is new in the interval. Lytic lesion in the posterior right fourth rib is new. 2 cm lytic lesion in the posterior left tenth rib is new. Right transverse process lesion at L2 is new. Lytic lesion in the left T9 vertebral body is new.  Upper Abdomen: 5.0 cm cystic lesion in the upper pole the left kidney is stable.  IMPRESSION: Interval development of multiple bony metastases including a nearly 4 cm lesion in the  T2 vertebral body which essentially destroys the vertebral body and penetrates through the posterior cortex into the anterior spinal canal. Another large lesion is seen in the left clavicular head. Other scattered smaller rib and spinal lesions are identified.  Multiple masslike opacities scattered in the lungs. These also suggest metastatic disease in show a variable response to therapy with some lesions progressing in the interval and others decreasing.  I discussed these findings by telephone with Ned Card at approximately 10:52 a.m. on 05/17/2014.  Electronically Signed: By: Misty Stanley M.D. On:  05/17/2014 10:52   Dg C-arm 1-60 Min  05/03/2014   CLINICAL DATA:  ORIF of a right proximal femur fracture  EXAM: DG C-ARM 61-120 MIN; RIGHT FEMUR - 2 VIEW  FINDINGS: Images show placement of a long intra medullary rod supporting a compression screw reducing the intertrochanteric fracture fragments into near anatomic alignment. No acute fracture. No evidence of an operative complication.  IMPRESSION: Right proximal femur fracture ORIF. Fracture fragments well aligned.   Electronically Signed   By: Lajean Manes M.D.   On: 05/03/2014 15:44    Assessmnent/Plan:79 y.o. male admitted on with   1. Left  upper lip abcsess Most likely secondary to infectious etiology Continue Vancomycin ENT to see for I and D  2. Espohageal Cancer On XRT. Finished Xeloda about 3 weeks ago.  Will discuss with radiation oncology to hold palliative radiation therapy this week   3. Dysphagia  Secondary to 1 and 2 For swallowing study end of June Possible TPN via port versus J tube. Nutrition evaluation recommended  4. Back pain/ Hip pain  Secondary to malignancy. Controlled with current regimen On palliative radiation therapy  5. Diabetes Mellitus As per Primary team  5. Full Code  Elease Hashimoto 05/30/2014 Consultant note: I have seen and examined the patient and agree with  current plan of care. We'll discuss with radiation oncology on holding radiation therapy this week.  Patient has an appointment to see Dr. Benay Spice after completion of palliative radiation therapy to discuss further  treatment options. For nutrition  continue TPN for now, hoping his intake will improve once his infection resolves with I / D and antibiotics. If his nutrition does not improve in few days, consider J-tube placement. Appreciate dietitian evaluation.  Wilmon Arms, M.D. Medical oncology 05/30/2014

## 2014-05-30 NOTE — H&P (Addendum)
Vital Signs  Entered weight:  140  lbs., Calculated Weight: 140 lbs., ( 63.50 kg) Height: 73.5 in., ( 186.69 cm) Temperature: 97.4 deg F, Temperature site: oral Pulse rate: 88 Pulse rhythm: regular  Blood Pressure #1: 70 / 40 mm Hg    BMI: 18.22 BSA: 1.86 Wt Chg: -18 lbs since 03/04/2014  Vitals entered by: Dorrene German CMA on May 30, 2014 11:55 AM        Risk Factors:   Smoked Tobacco Use:  Former smoker       Years Since Last Quit:  35 Smokeless Tobacco Use:  Never    Counseled :  No Passive smoke exposure:  no Drug use:  no HIV high-risk behavior:  no Caffeine use:  5+ drinks per day Alcohol use:  no Exercise:  yes    Times per week:  3    Type of Exercise:  walks Seatbelt use:  100 % Sun Exposure:  occasionally  Previous Tobacco Use: Signed On - 03/04/2014 Smoked Tobacco Use:  Former smoker       Year quit:  1980       Years Since Last Quit:  35 years, 5 months, 17 days Smokeless Tobacco Use:  Never Passive smoke exposure:  no Drug use:  no HIV high-risk behavior:  no Caffeine use:  5+ drinks per day  Previous Alcohol Use: Signed On - 03/04/2014 Alcohol use:  no Exercise:  yes    Times per week:  3    Type of Exercise:  walks Seatbelt use:  100 % Sun Exposure:  occasionally  Colonoscopy History:    Date of Last Colonoscopy:  03/04/2014  History of Present Illness  Reason for visit: See chief complaint Chief Complaint: abscess L side mouth above lip noticed x3-4d ago, its draining from his nose not mouth, area is red and it hurts a little bit. salt water garggle and swishing w/ orajel liq w/o relief.   History of Present Illness: L cheek Abscess - L side mouth above lip  x3-4d ago, its draining from his nose not mouth, area is red and it hurts a little bit. salt water garggle and swishing w/ orajel liq w/o relief.   Not on Abx at this time. Undergoing XRT - Finishing up Neck and throat radiation.  Hip ongoing  Losing weight.  Eating is a  chore   Review of Systems  General:       Complains of fatigue, weight loss.        Denies fevers, chills, headache, sweats, anorexia, malaise.   Ears/Nose/Throat:       Complains of hoarseness.        Denies earache, ear discharge, tinnitus, decreased hearing, nasal congestion, nosebleeds, sore throat, dysphagia.   Cardiovascular:       Denies chest pains, claudication, palpitations, syncope, dyspnea on exertion, orthopnea, PND, peripheral edema.   Respiratory:       Denies cough, dyspnea, excessive sputum, hemoptysis, wheezing.   Gastrointestinal:       Complains of diarrhea, constipation.        Denies nausea, vomiting, heartburn, change in bowel habits, abdominal pain, melena, hematochezia, jaundice.   Musculoskeletal:       Complains of back pain.   Skin:       Complains of itching.        Denies rash, dryness, suspicious lesions.   Neurologic:       Complains of gait instability.        Denies radicular symptoms,  weakness, paresthesias, seizures, syncope, tremors, vertigo, dizziness.        walker Psychiatric:       Denies depression, anxiety, memory loss, mental disturbance, suicidal ideation, hallucinations, paranoia.        in much better mood than he ought to be Heme/Lymphatic:       Denies abnormal bruising, bleeding, enlarged lymph nodes.   Allergic/Immunologic:       Denies urticaria, hay fever, persistent infections, HIV exposure.    Past History Past Medical History (reviewed - no changes required): DM2 Dxed 1/03.    Former excessive ETOH-resolved,  Broken arm 1952,  GERD/HH/Barrett's/Stricture--Dilitation Dr Oletta Lamas,   Gout,  H/O SBO,  Onchymycoses,  RLS/Nighttime leg cramps,   Hyperlipidemia,  Rosacea,  Pyronies Dz,  ED,  Rosacea.  H/O foot Cellulitis. LDL is 110/TG is 214 Esophageal Carcinoma S/P Extensive Treatment. Esophagectomy 04/20/13, Pl effusion tap 10/23/2013, Larnx plasty 11/16/13. Recurrent CA c CHEMO/XRT sugical pin for impending Hip Patholic Fx April -  June 2015 Facial Abscess 05/2014 Surgical History (reviewed - no changes required): Intestinal blockage SBO/LBG NGT lavage, Bowel rec 1/03 Recurrent SBO 11/06 Laryngoscopy 1958, benign polyp T & A 1942 August 2013 - Eye Lid surgery Amy Vickki Muff Family History (reviewed - no changes required): Father died at age 80, DM2, Agranulocytosis. Mother died at age 30 Sister OA Brother Gout Brother CAD, CAGB Social History (reviewed - no changes required): Patient is married, 4 children, 3 grandchildren. Retired;part-time auto-trader. Quit smoking in 1980, quit drinking in 1990.  Family History Summary:     Reviewed history Last on 03/04/2014 and no changes required:05/30/2014 Father Ailene Ravel.) - Has Family History of Diabetes - Entered On: 08/21/2013  General Comments - FH: Father died at age 21, DM2, Agranulocytosis. Mother died at age 40 Sister OA Brother Gout Brother CAD, CAGB  Social History:    Reviewed history from 06/30/2009 and no changes required:       Patient is married, 4 children, 3 grandchildren. Retired;part-time auto-trader. Quit smoking in 1980, quit drinking in 1990.   Physical Exam  General appearance: well nourished, well hydrated, no acute distress. Has lost lots of weight.  Eyes  External: conjunctivae and lids normal Pupils: equal, round, reactive to light and accommodation  Ears, Nose and Throat  External ears: normal, no lesions or deformities External nose: normal, no lesions or deformities Otoscopic: canals clear, tympanic membranes intact, no fluid Hearing: grossly intact Nasal: mucosa, septum, and turbinates normal Dental: dentures Pharynx: tongue normal, protrudes mid line,  posterior pharynx without erythema or exudate  Neck  Neck: supple, no masses, trachea midline Thyroid: no nodules, masses, tenderness, or enlargement  Respiratory  Respiratory effort: no intercostal retractions or use of accessory muscles Auscultation: sounds clearer than expected.   l sided scar  Cardiovascular  Palpation: no thrill or palpable murmurs, no displacement of PMI.  R PAC Auscultation: S1, S2, no murmur, rub, or gallop Carotid arteries: pulses 2+, symmetric, no bruits Periph. circulation: no cyanosis, clubbing, edema  Gastrointestinal  Abdomen: soft, non-tender, no masses, bowel sounds normal.  Lap Scars. Midline Surg scar.  PEJ out Liver and spleen: no enlargement or nodularity Rectal: stage 1 sacral pressure ulcer.  Musculoskeletal  Gait and station: normal Digits and nails: no clubbing, cyanosis, petechiae, or nodes Head and neck: normal alignment and mobility Spine, ribs, pelvis: normal alignment and mobility, no deformity RUE: normal ROM and strength, no joint enlargement or tenderness LUE: normal ROM and strength, no joint enlargement or tenderness RLE: normal  ROM and strength, no joint enlargement or tenderness LLE: normal ROM and strength, no joint enlargement or tenderness  Skin  Inspection: L Nare and L Cheek Lip Abscess. Palpation: no subcutaneous nodules or induration  Neurologic  Cranial nerves: II - XII grossly intact Reflexes: 2+, symmetric  Mental Status Exam  Judgment, insight: intact Orientation: oriented to time, place, and person Memory: intact Mood and affect: Normal Mood   Impression & Recommendations:  Problem # 1:  Dysphagia (ICD-787.20) (ICD10-R13.10) Has swallowing study next week. We discussed TNA/TPN via [place] J Tube and Nutrition that way Will discuss c Dr Benay Spice @ this.  Problem # 2:  Abscess (ICD-682.9) (ICD10-L02.91) L Cheek and potential Sinus Abscess c communication to L Nare. Draining well into Pustule in Nose. Needs Abx  He has Low BP from Malnutrition not Sepsis. Not Febrile and suprisingly looks better than he ought too.  Dr Redmond Baseman consulted and will see pt and discuss I and D.  Will also get IV Vanco.  Problem # 3:  Pressure ulcer stage I (ICD-707.21) (ICD10-L89.91) will get wound  care. Keep pressure off of it.  Problem # 4:  Carcinoma, esophagus (ICD-150.9) (ICD10-C15.9) 2 pm Dr Berenice Primas for Back and hip Rays 2:40 at XRT  Has PAC and last Chemo was @ 5 weeks ago Stopped oral chemo 2-3 weeks ago - Xeloda. Currently getting XRT.  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   -Status post an esophagogastrectomy 04/20/2013 with the pathology confirming a pT1b,pN1 tumor, grade 1 with negative surgical margins.   2. Odynophagia/early satiety. The odynophagia is better.   3. Postoperative hoarseness-status post collagen injection therapy at [facility].   4. Dysphagia progressive.   5. Chest CT 02/14/2014 showed a new masslike partially calcified lesion in the right lower lobe peripherally. There were some new lower lobe reticulonodular opacities. A peripherally located right lower lobe superior segment nodule was larger.  PET scan 02/19/2014 with evidence of multiple hypermetabolic lung nodules, hypermetabolic bone lesions, and a hypermetabolic focus at the left side of the prostate. CT biopsy of a right lower lobe subpleural nodule 03/07/2014 confirmed metastatic adenocarcinoma consistent with esophagus cancer. HER-2 negative. 3 Cycles CAPOX beginning 03/18/2014, 04/08/2014 and 04/29/2014. Restaging chest CT 05/17/2014 with interval development of multiple bone metastases including a nearly 4 cm lesion in the T2 vertebral body essentially destroying the vertebral body and penetrating through the posterior cortex into the anterior spinal canal. Another large lesion seen in the left clavicular head. Other scattered smaller rib and spinal lesions identified. Multiple masslike opacities scattered in the lungs.  had EGD 3/5 and had Dilation  chemotherapy on hold during the course of radiation.   Oxycontin 20 BID  Problem # 5:  Fatigue (ICD-780.79) (ICD10-R53.83) not surprising  Problem # 6:  DIABETES MELLITUS, TYPE II, CONTROLLED (ICD-250.00) (ICD10-E11.9)  His updated medication list for this problem includes:    Humalog Kwikpen 100 Unit/ml Soln (Insulin lispro (human)) ..... Ssi 2-3 times per day cbg 150-250 4 units cbg 251-400 6 units    Lantus Solostar 100 Unit/ml Soln (Insulin glargine) .Marland KitchenMarland KitchenMarland KitchenMarland Kitchen 10-12 units qhs  Hgb A1C: 6.2. Somehow still needing the lantus B/P: 70/40  Eye Examination:  due w/ [facility]/declined referral  (03/04/2014 3:56:56 PM) Foot Exam:  Mild reduced sensation, LDL: 71 Microalbumin/Creatinine: 118.3 MG/DL, 4.2 MG/DL GFR if AFA:  113.4 (?)  GFI if not AFA:  93.7 (?)  A1C's had been 5.8 - 6.7 No Lows. What he is doing is working. Rare ISS in am. If < 120 he does not mess with it. much better control with weight loss. Off Metformin, Januvia, and sulfonurea.  One Touch Ultra Eyes, feet stable - mild reduction of feeling in toes.Marland Kitchen  No Microab.  He will work on RF reduction.   Will Hold XRT today and sort out Abscess. May need TNA/J Tube feeds. May just need to be made DNR/Hospice Palliative care from the cancers perspective. Check Labs  Problem # 8:  Hip pain (ICD-719.45) (ICD10-M25.559) S/P Surgery to prevent Pathologic Fracture Was supposed to see Dr Berenice Primas today and get more films. I asked them to cancel it.  His updated medication list for this problem includes:    Oxycontin 20 Mg Oral T12a (Oxycodone hcl) ..... Bid    Percocet 5-325 Mg Oral Tabs (Oxycodone-acetaminophen) .Marland Kitchen... 2 q4h prn     Complete Medication List: 1)  Decadron 64m  .... Bid 2)  Oxycontin 20 Mg Oral T12a (Oxycodone hcl) .... Bid 3)  Percocet 5-325 Mg Oral Tabs (Oxycodone-acetaminophen) .... 2 q4h prn 4)  Humalog Kwikpen 100 Unit/ml Soln (Insulin lispro (human)) .... Ssi 2-3 times per day cbg 150-250 4 units cbg 251-400 6 units 5)  Bd Pen Needle Ultrafine 29g X  12.741mMisc (Insulin pen needle) .... Use 4 pen needles daily  dx:  250.00 6)  Lantus Solostar 100 Unit/ml Soln (Insulin glargine) ...Marland Kitchen 10-12 units qhs 7)  Xanax 0.5 Mg Tabs (Alprazolam) ...Marland Kitchen 1 po qhs 8)  Omeprazole 20 Mg Cpdr (Omeprazole) .... One po bid 9)  Onetouch Ultra Blue Strp (Glucose blood) .... Test blood sugars twice daily    Comments: [facility]   Electronically signed by JoPrecious ReelD on 05/30/2014 at 12:46 PM   May Need Sinus/Face CT - Dr BaRedmond Basemano assess.

## 2014-05-30 NOTE — Consult Note (Signed)
Reason for Consult:Facial abscess Referring Physician: Elis Sauber is an 80 y.o. male.  HPI: 80 year old male undergoing radiation treatment for metastatic esophageal carcinoma and developed a sore in the left nasal ala last weekend that drained some when he picked at it.  Then, a couple of days ago, he started swelling in the left upper lip.  The nasal sore drained quite a bit today but stopped and the lip has swelled more since then.  He was admitted and started on vancomycin and Zosyn.  Pain has worsened through the day and is pretty severe now.  Past Medical History  Diagnosis Date  . Gout     no flare up in ten years   . Barrett esophagus   . Esophageal cancer 12/22/2012    invasive adenocarcinoma arising in a background of barrett's mucosa  . Diabetes mellitus without complication     type II; last A1C 6.5 10/2012  . GERD (gastroesophageal reflux disease)     managed by prilosec  . Hx of pleural effusion 10/2013    left. pt states it was drained at [facility].     Past Surgical History  Procedure Laterality Date  . Nissen fundoplication    . Tonsillectomy    . Laryngoscopy    . Esophagogastroduodenoscopy  10/18/2012    Procedure: ESOPHAGOGASTRODUODENOSCOPY (EGD);  Surgeon: Winfield Cunas., MD;  Location: [facility];  Service: Endoscopy;  Laterality: N/A;  need xray  . Savory dilation  10/18/2012    Procedure: SAVORY DILATION;  Surgeon: Winfield Cunas., MD;  Location: [facility];  Service: Endoscopy;  Laterality: N/A;  . Esophagogastroduodenoscopy  12/22/2012    Procedure: ESOPHAGOGASTRODUODENOSCOPY (EGD);  Surgeon: Winfield Cunas., MD;  Location: [facility];  Service: Endoscopy;  Laterality: N/A;  with C-Arm  . Savory dilation  12/22/2012    Procedure: SAVORY DILATION;  Surgeon: Winfield Cunas., MD;  Location: [facility];  Service: Endoscopy;  Laterality: N/A;  . Eus  01/10/2013    Procedure: ESOPHAGEAL ENDOSCOPIC ULTRASOUND (EUS) RADIAL;  Surgeon:  Arta Silence, MD;  Location: [facility];  Service: Endoscopy;  Laterality: N/A;  . Removal of breast tissue    . Esophagectomy  04/20/2013  . Larynx surgery  11/2013    LARYNGEAL REPAIR   . Cholecystectomy    . Femur im nail Right 05/03/2014    Procedure: INTRAMEDULLARY (IM) NAIL FEMORAL;  Surgeon: Alta Corning, MD;  Location: [place];  Service: Orthopedics;  Laterality: Right;    Family History  Problem Relation Age of Onset  . Cancer Father     pancreatic cancer; questionable miss dx  . Dementia Father   . Cancer Mother     cervical    Social History:  reports that he quit smoking about 30 years ago. He started smoking about 59 years ago. He has never used smokeless tobacco. He reports that he does not drink alcohol or use illicit drugs.  Allergies:  Allergies  Allergen Reactions  . Ivp Dye [Iodinated Diagnostic Agents] Swelling    13 hr prep/reaction was 20 years ago per patient    Medications: I have reviewed the patient's current medications.  Results for orders placed during the hospital encounter of 05/30/14 (from the past 48 hour(s))  MRSA PCR SCREENING     Status: Abnormal   Collection Time    05/30/14  2:56 PM      Result Value Ref Range   MRSA by PCR POSITIVE (*)  NEGATIVE   Comment:            The GeneXpert MRSA Assay (FDA     approved for NASAL specimens     only), is one component of a     comprehensive MRSA colonization     surveillance program. It is not     intended to diagnose MRSA     infection nor to guide or     monitor treatment for     MRSA infections.     RESULT CALLED TO, READ BACK BY AND VERIFIED WITH:     Trude Mcburney RN [id number] 05/30/14 A [facility]  COMPREHENSIVE METABOLIC PANEL     Status: Abnormal   Collection Time    05/30/14  3:30 PM      Result Value Ref Range   Sodium 139  137 - 147 mEq/L   Potassium 4.1  3.7 - 5.3 mEq/L   Chloride 102  96 - 112 mEq/L   CO2 24  19 - 32 mEq/L   Glucose, Bld 105 (*) 70 - 99 mg/dL   BUN 63 (*) 6 - 23  mg/dL   Creatinine, Ser 1.14  0.50 - 1.35 mg/dL   Calcium 9.7  8.4 - 10.5 mg/dL   Total Protein 6.3  6.0 - 8.3 g/dL   Albumin 2.8 (*) 3.5 - 5.2 g/dL   AST 12  0 - 37 U/L   ALT 9  0 - 53 U/L   Alkaline Phosphatase 91  39 - 117 U/L   Total Bilirubin 0.3  0.3 - 1.2 mg/dL   GFR calc non Af Amer 60 (*) >90 mL/min   GFR calc Af Amer 70 (*) >90 mL/min   Comment: (NOTE)     The eGFR has been calculated using the CKD EPI equation.     This calculation has not been validated in all clinical situations.     eGFR's persistently <90 mL/min signify possible Chronic Kidney     Disease.  MAGNESIUM     Status: None   Collection Time    05/30/14  3:30 PM      Result Value Ref Range   Magnesium 2.1  1.5 - 2.5 mg/dL  PHOSPHORUS     Status: None   Collection Time    05/30/14  3:30 PM      Result Value Ref Range   Phosphorus 3.2  2.3 - 4.6 mg/dL  CBC     Status: Abnormal   Collection Time    05/30/14  3:30 PM      Result Value Ref Range   WBC 12.8 (*) 4.0 - 10.5 K/uL   RBC 3.20 (*) 4.22 - 5.81 MIL/uL   Hemoglobin 10.7 (*) 13.0 - 17.0 g/dL   HCT 31.7 (*) 39.0 - 52.0 %   MCV 99.1  78.0 - 100.0 fL   MCH 33.4  26.0 - 34.0 pg   MCHC 33.8  30.0 - 36.0 g/dL   RDW 20.5 (*) 11.5 - 15.5 %   Platelets 271  150 - 400 K/uL  APTT     Status: None   Collection Time    05/30/14  3:30 PM      Result Value Ref Range   aPTT 25  24 - 37 seconds  PROTIME-INR     Status: None   Collection Time    05/30/14  3:30 PM      Result Value Ref Range   Prothrombin Time 13.1  11.6 - 15.2 seconds  INR 1.01  0.00 - 1.49  TSH     Status: None   Collection Time    05/30/14  3:30 PM      Result Value Ref Range   TSH 1.270  0.350 - 4.500 uIU/mL   Comment: Performed at [place] A1C     Status: Abnormal   Collection Time    05/30/14  3:30 PM      Result Value Ref Range   Hemoglobin A1C 6.4 (*) <5.7 %   Comment: (NOTE)                                                                                According to the ADA Clinical Practice Recommendations for 2011, when     HbA1c is used as a screening test:      >=6.5%   Diagnostic of Diabetes Mellitus               (if abnormal result is confirmed)     5.7-6.4%   Increased risk of developing Diabetes Mellitus     References:Diagnosis and Classification of Diabetes Mellitus,Diabetes     TAVW,9794,80(XKPVV 1):S62-S69 and Standards of Medical Care in             Diabetes - 2011,Diabetes Care,2011,34 (Suppl 1):S11-S61.   Mean Plasma Glucose 137 (*) <117 mg/dL   Comment: Performed at [place], [place]     Status: None   Collection Time    05/30/14  4:38 PM      Result Value Ref Range   Glucose-Capillary 93  70 - 99 mg/dL    No results found.  Review of Systems  Constitutional: Positive for weight loss.  Gastrointestinal: Positive for diarrhea and constipation.  Musculoskeletal: Positive for back pain.  Neurological: Positive for weakness and headaches.  All other systems reviewed and are negative.  Blood pressure 109/61, pulse 91, temperature 97.7 F (36.5 C), temperature source Oral, resp. rate 18, height 6' 1" (1.854 m), weight 63.73 kg (140 lb 8 oz), SpO2 98.00%. Physical Exam  Constitutional: He is oriented to person, place, and time. He appears well-developed and well-nourished. No distress.  HENT:  Head: Normocephalic and atraumatic.  Right Ear: External ear normal.  Left Ear: External ear normal.  Nose: Nose normal.  Mouth/Throat: Oropharynx is clear and moist.  Left upper lip with sizeable area of fluctuance extending from columella to nasolabial fold.  Small exudative spot in left inferior nasal ala.  Eyes: Conjunctivae and EOM are normal. Pupils are equal, round, and reactive to light.  Neck: Normal range of motion. Neck supple.  Cardiovascular: Normal rate.   Respiratory: Effort normal.  Musculoskeletal: Normal range of motion.  Neurological: He is alert and oriented to person, place,  and time. No cranial nerve deficit.  Skin: Skin is warm and dry.  Psychiatric: He has a normal mood and affect. His behavior is normal. Judgment and thought content normal.    Assessment/Plan: Left upper lip abscess I recommended and performed incision and drainage through the mouth at the bedside.  A lot of frank pus drained and a culture was obtained.  I left a Penrose drain in two  sites and will plan to remove them in two days.  Continue IV antibiotics until certainly improving.  [name], [name] 05/30/2014, 9:42 PM

## 2014-05-30 NOTE — Procedures (Signed)
Preop diagnosis: Upper lip abscess Postop diagnosis: Same Procedure: Transoral incision and drainage of upper lip abscess Surgeon: Redmond Baseman Anesthesia: Local with 1% lidocaine Compl: None Findings: A large volume of pus drained from the two sites.  A culture swab was obtained. Description: After discussing risks, benefits, and alternatives, the inner surface of the upper lip was injected with local anesthetic medially and laterally.  Each site was then incised through the mucosa with an 11 blade scalpel and pus was immediately encountered.  A culture was taken from the medial site.  The wounds were probed with a hemostat.  A 1/4 inch Penrose drain was then placed in each site and secured to the mucosa with 3-0 silk suture.  The patient was cleaned off and returned to nursing care in stable condition.

## 2014-05-30 NOTE — Consult Note (Signed)
WOC wound consult note Reason for Consult: Sacral and coccyx pressure ulcers, chronic and in various stages of healing.  Patient does not like to lie on side due to esophageal and nasal secretions.  Previous ulcer on coccyx (Stage III reported by wife) has healed.  Stage I at this time on sacrum, Stage II on coccyx, but at a different place than the previous ulceration, is partial thickness. Wound type:Pressure Pressure Ulcer POA: Yes Measurement:  Entire area of non-blanchable erythema measures 10cm x 12cm.  Stage II area at coccyx measures 2cm x 0.8cm x 0.2cm.  Patient begins scratching area as soon as previous dressing is removed. Encouraged to avoid this behavior as it will injure surrounding weakened and compromised tissues. Stage II area is partial thickness and pink, moist. Wound bed:As described above Drainage (amount, consistency, odor) Scant amount of serous drainage on old dressing Periwound:As described above, non-blanchable erythema (Stage I) surround Stage II ulcer and extends to sacrum Dressing procedure/placement/frequency:I will continue the use of the soft silicone foam dressing and add a low air loss mattress replacement and provide a pressure redistribution chair cushion for his use when OOB in chair. [place] nursing team will not follow, but will remain available to this patient, the nursing and medical teams.  Please re-consult if needed. Thanks, Maudie Flakes, MSN, RN, [place], [place], [place] [phone])

## 2014-05-30 NOTE — Consult Note (Signed)
ANTIBIOTIC CONSULT NOTE - INITIAL  Pharmacy Consult for vancomycin Indication: facial abscess  Allergies  Allergen Reactions  . Ivp Dye [Iodinated Diagnostic Agents] Swelling    13 hr prep/reaction was 20 years ago per patient    Patient Measurements:    Vital Signs:   Intake/Output from previous day:   Intake/Output from this shift:    Labs:  Recent Labs  05/27/14 1434  CREATININE 0.9   The CrCl is unknown because both a height and weight (above a minimum accepted value) are required for this calculation. No results found for this basename: VANCOTROUGH, VANCOPEAK, VANCORANDOM, GENTTROUGH, GENTPEAK, GENTRANDOM, TOBRATROUGH, TOBRAPEAK, TOBRARND, AMIKACINPEAK, AMIKACINTROU, AMIKACIN,  in the last 72 hours   Microbiology: No results found for this or any previous visit (from the past 720 hour(s)).  Medical History: Past Medical History  Diagnosis Date  . Gout     no flare up in ten years   . Barrett esophagus   . Esophageal cancer 12/22/2012    invasive adenocarcinoma arising in a background of barrett's mucosa  . Diabetes mellitus without complication     type II; last A1C 6.5 10/2012  . GERD (gastroesophageal reflux disease)     managed by prilosec  . Hx of pleural effusion 10/2013    left. pt states it was drained at [facility].     Medications:  Scheduled:  . dexamethasone  4 mg Oral Q12H  . insulin aspart  0-9 Units Subcutaneous TID WC  . OxyCODONE  20 mg Oral Q12H  . pantoprazole  40 mg Oral Daily  . piperacillin-tazobactam  3.375 g Intravenous 3 times per day  . banana bag IV 1000 mL   Intravenous Once   Assessment: 80 year old man directly admitted today with dysphagia and facial abscess, possibly to get I&D. Patient is to be started on vanc per pharmacy, and MD also has started Zosyn.   PMH significant for esophageal carcinoma, currently receiving radiation therapy (stopped oral Xeloda 2-3 weeks ago).   6/18 >>Vanc >> 6/18 >> Zosyn (per MD) >>   Tmax:  afebrile Renal: SCr= 0.9, CrCl ~ 24mls/min  Goal of Therapy:  Vancomycin trough level 15-20 mcg/ml  Plan:  Begin vancomycin 750 mg IV q 12h Continue Zosyn 3.375mg  q8h (per MD) Obtain VT at steady state F/u renal function, fever trend, clinical progression  Katelyn C. Felt, PharmD Clinical Pharmacist-Resident Pager: [phone] 05/30/2014 2:00 PM

## 2014-05-30 NOTE — Progress Notes (Signed)
PARENTERAL NUTRITION CONSULT NOTE - INITIAL  Pharmacy Consult for TNA Indication: esophageal cancer with weight loss  Allergies  Allergen Reactions  . Ivp Dye [Iodinated Diagnostic Agents] Swelling    13 hr prep/reaction was 20 years ago per patient    Patient Measurements: Height: 6\' 1"  (185.4 cm) IBW/kg (Calculated) : 79.9 Actual Body Weight: 64.2 Usual Body Weight: ~81kg a few month ago  Vital Signs: Temp: 97.7 F (36.5 C) (06/18 1457) Temp src: Oral (06/18 1457) BP: 109/61 mmHg (06/18 1457) Pulse Rate: 91 (06/18 1457) Intake/Output from previous day:   Intake/Output from this shift:    Labs:  Recent Labs  05/30/14 1530  WBC 12.8*  HGB 10.7*  HCT 31.7*  PLT 271  APTT 25  INR 1.01    No results found for this basename: NA, K, CL, CO2, GLUCOSE, BUN, CREATININE, LABCREA, CREAT24HRUR, CALCIUM, MG, PHOS, PROT, ALBUMIN, AST, ALT, ALKPHOS, BILITOT, BILIDIR, IBILI, PREALBUMIN, TRIG, CHOLHDL, CHOL,  in the last 72 hours The CrCl is unknown because both a height and weight (above a minimum accepted value) are required for this calculation.   No results found for this basename: GLUCAP,  in the last 72 hours  Medical History: Past Medical History  Diagnosis Date  . Gout     no flare up in ten years   . Barrett esophagus   . Esophageal cancer 12/22/2012    invasive adenocarcinoma arising in a background of barrett's mucosa  . Diabetes mellitus without complication     type II; last A1C 6.5 10/2012  . GERD (gastroesophageal reflux disease)     managed by prilosec  . Hx of pleural effusion 10/2013    left. pt states it was drained at [facility].     Medications:  Scheduled:  . dexamethasone  4 mg Oral Q12H  . insulin aspart  0-9 Units Subcutaneous TID WC  . OxyCODONE  20 mg Oral Q12H  . pantoprazole  40 mg Oral Daily  . piperacillin-tazobactam  3.375 g Intravenous 3 times per day  . vancomycin  750 mg Intravenous Q12H   Infusions:  . sodium chloride 50 mL/hr at  05/30/14 1517    Insulin Requirements in the past 24 hours:  n/a  Current Nutrition:  NPO  IV Access: PAC  Assessment: 80 year old man directly admitted today with dysphagia and facial abscess, possibly to get I&D. Patient is to be started on vanc per pharmacy, and MD also has started Zosyn. Note patient with hx adenocarcinoma of distal esophagus with last cycle of CAPOX chemo on 5/18. Restaging chest CT on 6/5 showed multiple bone metastases. Patient also stopped oral chemo Xeloda approx 2-3 weeks ago but continuing XRT. Plan to start TNA per pharmacy for metastatic esophageal cancer with weight loss. See dietitian note for more detail   CBGs: 93  Lytes: stable including K, mag, phos, Na  Renal: SCr stable at 1.14, CrCl 49  Liver: WNL  Prealb: to be checked  TGs: to be checked   Nutritional Goals:    Per RD assessment, daily goals include Kcal 2200-2400, protein 115-125 g, and 2200 mL  Clinimix 5/15 formula + Lipids at goal rate of 100 ml/hr to provide 2400 mL, 120g protein and 2184 kcal per day  Plan:  1) Start Clinimix E 5/15 at 40 ml/hr 2) Lipids at 10 ml/hr 3) Multivitamin and trace elements added daily to TNA 4) Mon/Thurs TNA labs 5) Check CBGs q6 and start [place], PharmD, BCPS Pager  [phone] 05/30/2014 4:00 PM

## 2014-05-31 ENCOUNTER — Ambulatory Visit: Payer: Medicare PPO

## 2014-05-31 ENCOUNTER — Ambulatory Visit
Admission: RE | Admit: 2014-05-31 | Discharge: 2014-05-31 | Disposition: A | Discharge status: Home / Self Care | Payer: Medicare PPO | Source: Ambulatory Visit | Attending: Radiation Oncology | Admitting: Radiation Oncology

## 2014-05-31 LAB — COMPREHENSIVE METABOLIC PANEL
ALK PHOS: 94 U/L (ref 39–117)
ALT: 10 U/L (ref 0–53)
AST: 15 U/L (ref 0–37)
Albumin: 2.7 g/dL — ABNORMAL LOW (ref 3.5–5.2)
BILIRUBIN TOTAL: 0.6 mg/dL (ref 0.3–1.2)
BUN: 48 mg/dL — ABNORMAL HIGH (ref 6–23)
CHLORIDE: 100 meq/L (ref 96–112)
CO2: 26 mEq/L (ref 19–32)
CREATININE: 0.91 mg/dL (ref 0.50–1.35)
Calcium: 9.5 mg/dL (ref 8.4–10.5)
GFR, EST NON AFRICAN AMERICAN: 80 mL/min — AB (ref >90)
GLUCOSE: 188 mg/dL — AB (ref 70–99)
POTASSIUM: 4.9 meq/L (ref 3.7–5.3)
Sodium: 136 mEq/L — ABNORMAL LOW (ref 137–147)
Total Protein: 5.9 g/dL — ABNORMAL LOW (ref 6.0–8.3)

## 2014-05-31 LAB — DIFFERENTIAL
BASOS PCT: 0 % (ref 0–1)
Basophils Absolute: 0 10*3/uL (ref 0.0–0.1)
Eosinophils Absolute: 0 10*3/uL (ref 0.0–0.7)
Eosinophils Relative: 0 % (ref 0–5)
LYMPHS ABS: 0.4 10*3/uL — AB (ref 0.7–4.0)
Lymphocytes Relative: 4 % — ABNORMAL LOW (ref 12–46)
MONO ABS: 0.7 10*3/uL (ref 0.1–1.0)
MONOS PCT: 6 % (ref 3–12)
Neutro Abs: 9.7 10*3/uL — ABNORMAL HIGH (ref 1.7–7.7)
Neutrophils Relative %: 90 % — ABNORMAL HIGH (ref 43–77)

## 2014-05-31 LAB — CBC
HEMATOCRIT: 31.7 % — AB (ref 39.0–52.0)
Hemoglobin: 10.5 g/dL — ABNORMAL LOW (ref 13.0–17.0)
MCH: 33 pg (ref 26.0–34.0)
MCHC: 33.1 g/dL (ref 30.0–36.0)
MCV: 99.7 fL (ref 78.0–100.0)
Platelets: 235 10*3/uL (ref 150–400)
RBC: 3.18 MIL/uL — AB (ref 4.22–5.81)
RDW: 20.3 % — ABNORMAL HIGH (ref 11.5–15.5)
WBC: 10.8 10*3/uL — AB (ref 4.0–10.5)

## 2014-05-31 LAB — GLUCOSE, CAPILLARY
GLUCOSE-CAPILLARY: 124 mg/dL — AB (ref 70–99)
GLUCOSE-CAPILLARY: 149 mg/dL — AB (ref 70–99)
Glucose-Capillary: 132 mg/dL — ABNORMAL HIGH (ref 70–99)
Glucose-Capillary: 137 mg/dL — ABNORMAL HIGH (ref 70–99)
Glucose-Capillary: 246 mg/dL — ABNORMAL HIGH (ref 70–99)
Glucose-Capillary: 287 mg/dL — ABNORMAL HIGH (ref 70–99)

## 2014-05-31 LAB — PREALBUMIN: PREALBUMIN: 22.1 mg/dL (ref 17.0–34.0)

## 2014-05-31 LAB — PHOSPHORUS: Phosphorus: 3 mg/dL (ref 2.3–4.6)

## 2014-05-31 LAB — MAGNESIUM: MAGNESIUM: 2 mg/dL (ref 1.5–2.5)

## 2014-05-31 LAB — POTASSIUM: Potassium: 4.8 mEq/L (ref 3.7–5.3)

## 2014-05-31 LAB — TRIGLYCERIDES: Triglycerides: 100 mg/dL (ref <150)

## 2014-05-31 MED ORDER — BOOST PLUS PO LIQD
237.0000 mL | Freq: Three times a day (TID) | ORAL | Status: DC
  Administered 2014-05-31 – 2014-06-03 (×12): 237 mL via ORAL
  Filled 2014-05-31 (×15): qty 237

## 2014-05-31 MED ORDER — ENSURE PUDDING PO PUDG
1.0000 | Freq: Three times a day (TID) | ORAL | Status: DC
  Administered 2014-06-01 – 2014-06-03 (×7): 1 via ORAL
  Filled 2014-05-31 (×10): qty 1

## 2014-05-31 MED ORDER — MEGESTROL ACETATE 400 MG/10ML PO SUSP
800.0000 mg | Freq: Every day | ORAL | Status: DC
  Administered 2014-05-31 – 2014-06-02 (×3): 800 mg via ORAL
  Filled 2014-05-31 (×5): qty 20

## 2014-05-31 MED ORDER — MORPHINE SULFATE 2 MG/ML IJ SOLN
1.0000 mg | INTRAMUSCULAR | Status: DC | PRN
  Administered 2014-05-31 – 2014-06-03 (×9): 1 mg via INTRAVENOUS
  Filled 2014-05-31 (×9): qty 1

## 2014-05-31 MED ORDER — OXYCODONE-ACETAMINOPHEN 5-325 MG PO TABS
1.0000 | ORAL_TABLET | Freq: Four times a day (QID) | ORAL | Status: DC | PRN
  Administered 2014-06-02 – 2014-06-03 (×2): 2 via ORAL
  Filled 2014-05-31 (×2): qty 2

## 2014-05-31 MED ORDER — MORPHINE SULFATE 2 MG/ML IJ SOLN
INTRAMUSCULAR | Status: AC
  Filled 2014-05-31: qty 1

## 2014-05-31 MED ORDER — TRACE MINERALS CR-CU-F-FE-I-MN-MO-SE-ZN IV SOLN
INTRAVENOUS | Status: AC
  Administered 2014-05-31: 17:00:00 via INTRAVENOUS
  Filled 2014-05-31: qty 1000

## 2014-05-31 MED ORDER — FAT EMULSION 20 % IV EMUL
250.0000 mL | INTRAVENOUS | Status: AC
  Administered 2014-05-31: 250 mL via INTRAVENOUS
  Filled 2014-05-31: qty 250

## 2014-05-31 MED ORDER — ENOXAPARIN SODIUM 30 MG/0.3ML ~~LOC~~ SOLN
30.0000 mg | SUBCUTANEOUS | Status: DC
  Administered 2014-05-31 – 2014-06-03 (×4): 30 mg via SUBCUTANEOUS
  Filled 2014-05-31 (×4): qty 0.3

## 2014-05-31 MED ORDER — DOXYCYCLINE HYCLATE 100 MG PO TABS
100.0000 mg | ORAL_TABLET | Freq: Two times a day (BID) | ORAL | Status: DC
  Administered 2014-06-01 – 2014-06-03 (×5): 100 mg via ORAL
  Filled 2014-05-31 (×6): qty 1

## 2014-05-31 MED ORDER — MEGESTROL ACETATE 40 MG PO TABS: 320.0000 mg | ORAL_TABLET | Freq: Every day | ORAL | Status: DC

## 2014-05-31 MED ORDER — ALPRAZOLAM 0.5 MG PO TABS
0.5000 mg | ORAL_TABLET | Freq: Two times a day (BID) | ORAL | Status: DC | PRN
  Administered 2014-05-31 – 2014-06-02 (×3): 1 mg via ORAL
  Filled 2014-05-31 (×3): qty 2

## 2014-05-31 MED ORDER — INSULIN GLARGINE 100 UNIT/ML ~~LOC~~ SOLN
10.0000 [IU] | Freq: Every day | SUBCUTANEOUS | Status: DC
  Administered 2014-05-31 – 2014-06-02 (×3): 10 [IU] via SUBCUTANEOUS
  Filled 2014-05-31 (×4): qty 0.1

## 2014-05-31 MED ORDER — INSULIN ASPART 100 UNIT/ML ~~LOC~~ SOLN
0.0000 [IU] | SUBCUTANEOUS | Status: DC
  Administered 2014-05-31: 1 [IU] via SUBCUTANEOUS
  Administered 2014-05-31: 2 [IU] via SUBCUTANEOUS
  Administered 2014-05-31: 8 [IU] via SUBCUTANEOUS
  Administered 2014-06-01: 3 [IU] via SUBCUTANEOUS
  Administered 2014-06-01: 2 [IU] via SUBCUTANEOUS
  Administered 2014-06-01 (×2): 5 [IU] via SUBCUTANEOUS
  Administered 2014-06-01 – 2014-06-02 (×3): 3 [IU] via SUBCUTANEOUS
  Administered 2014-06-02: 5 [IU] via SUBCUTANEOUS
  Administered 2014-06-03 (×2): 2 [IU] via SUBCUTANEOUS
  Administered 2014-06-03: 3 [IU] via SUBCUTANEOUS

## 2014-05-31 NOTE — Evaluation (Signed)
Occupational Therapy Evaluation Patient Details Name: Matthew White MRN: [id number] DOB: 1934-05-17 Today's Date: 05/31/2014    History of Present Illness Pt. with facial abscess, malnuitrician, espohogeal CA.    Clinical Impression   Pt. And wife differed AE for LE ADLs and are currently at [facility] A level which is his baseline. Pt. Is Min Guard A with transfers. Pt. Wife does want a shower seat that will fit in shower. Pt. And wife differ further OT but want PT to address LE strength issues.     Follow Up Recommendations       Equipment Recommendations  Tub/shower seat    Recommendations for Other Services       Precautions / Restrictions Precautions Precautions: Fall Restrictions Weight Bearing Restrictions: No      Mobility Bed Mobility                  Transfers                 General transfer comment:  (Min Guard A with transfers bed, and commode.)    Balance                                            ADL Overall ADL's : At baseline                                       General ADL Comments:  Pt. and wife do not want ed. on AE for lE dressing.     Vision                     Perception     Praxis      Pertinent Vitals/Pain No c/o     Hand Dominance     Extremity/Trunk Assessment Upper Extremity Assessment Upper Extremity Assessment: Generalized weakness           Communication     Cognition Arousal/Alertness: Awake/alert Behavior During Therapy: WFL for tasks assessed/performed Overall Cognitive Status: Within Functional Limits for tasks assessed                     General Comments       Exercises       Shoulder Instructions      Home Living Family/patient expects to be discharged to:: Private residence Living Arrangements: Spouse/significant other                 Bathroom Shower/Tub: Hospital doctor Toilet: Handicapped height     Home  Equipment: Environmental consultant - 2 wheels;Bedside commode;Tub bench   Additional Comments: Pt. wants shower seat that will fit in shower.       Prior Functioning/Environment Level of Independence: Needs assistance  Gait / Transfers Assistance Needed:  (Min Guard A to S for sit to stand and stand to sit. ) ADL's / Homemaking Assistance Needed:  (Pt. wife would A with socks. )        OT Diagnosis:     OT Problem List:     OT Treatment/Interventions:      OT Goals(Current goals can be found in the care plan section) Acute Rehab OT Goals Patient Stated Goal:  (go home)  OT Frequency:     Barriers to D/C:  Co-evaluation              End of Session Equipment Utilized During Treatment: Rolling walker  Activity Tolerance: Patient tolerated treatment well Patient left: in chair;with call bell/phone within reach;with family/visitor present   Time: 1000-1053 OT Time Calculation (min): 53 min Charges:  OT General Charges $OT Visit: 1 Procedure OT Evaluation $Initial OT Evaluation Tier I: 1 Procedure OT Treatments $Self Care/Home Management : 23-37 mins $Therapeutic Activity: 8-22 mins G-Codes:    HORVATH,STEPHANIE June 27, 2014, 10:52 AM

## 2014-05-31 NOTE — Progress Notes (Signed)
CSW received referral to assist with setting up TNA in the home and [facility] PT.   Inappropriate CSW referral.  CSW will notify RNCM in order for RNCM to assist with home needs.  CSW signing off.   Please re-consult if social work needs arise.  Alison Murray, MSW, [place] [employer] [phone]

## 2014-05-31 NOTE — Progress Notes (Signed)
  Subjective: Facial swelling and pain much improved today.  Objective: Vital signs in last 24 hours: Temp:  [97.3 F (36.3 C)-98.7 F (37.1 C)] 97.3 F (36.3 C) (06/19 0648) Pulse Rate:  [76-91] 76 (06/19 0648) Resp:  [18] 18 (06/19 0648) BP: (108-126)/(54-61) 108/60 mmHg (06/19 0648) SpO2:  [98 %-100 %] 98 % (06/19 0648) Weight:  [62.4 kg (137 lb 9.1 oz)-63.73 kg (140 lb 8 oz)] 62.4 kg (137 lb 9.1 oz) (06/19 0500) Last BM Date: 05/29/14  Intake/Output from previous day: 06/18 0701 - 06/19 0700 In: 1865.8 [I.V.:780.8; IV Piggyback:450; TPN:635] Out: 1400 [Urine:1400] Intake/Output this shift: Total I/O In: -  Out: 250 [Urine:250]  General appearance: alert, cooperative and no distress Head: Left upper lip edema much improved, mildly tender.  Medial Penrose drain dislodged and was removed.  No significant pus expressible from that site.  Lateral drain remains in good position, pus expressed from this site.  Lab Results:   Recent Labs  05/30/14 1530 05/31/14 0410  WBC 12.8* 10.8*  HGB 10.7* 10.5*  HCT 31.7* 31.7*  PLT 271 235   BMET  Recent Labs  05/30/14 1530 05/31/14 0410 05/31/14 0919  NA 139 136*  --   K 4.1 4.9 4.8  CL 102 100  --   CO2 24 26  --   GLUCOSE 105* 188*  --   BUN 63* 48*  --   CREATININE 1.14 0.91  --   CALCIUM 9.7 9.5  --    PT/INR  Recent Labs  05/30/14 1530  LABPROT 13.1  INR 1.01   ABG No results found for this basename: PHART, PCO2, PO2, HCO3,  in the last 72 hours  Studies/Results: No results found.  Anti-infectives: Anti-infectives   Start     Dose/Rate Route Frequency Ordered Stop   06/01/14 1300  doxycycline (VIBRA-TABS) tablet 100 mg     100 mg Oral Every 12 hours 05/31/14 1143 06/06/14 0959   05/30/14 1500  vancomycin (VANCOCIN) IVPB 750 mg/150 ml premix     750 mg 150 mL/hr over 60 Minutes Intravenous Every 12 hours 05/30/14 1401     05/30/14 1400  piperacillin-tazobactam (ZOSYN) IVPB 3.375 g     3.375  g 12.5 mL/hr over 240 Minutes Intravenous 3 times per day 05/30/14 1302 06/01/14 0559      Assessment/Plan: s/p I&D lip abscess Medial Penrose removed.  Will leave lateral Penrose for another day or two.  Culture pending.  Continue antibiotic therapy.  LOS: 1 day    White, Matthew 05/31/2014

## 2014-05-31 NOTE — Progress Notes (Signed)
[facility] Therapy Treatment Record Phone [phone]   Radiation Therapy was administered to Matthew White on: 05/31/2014  3:22 PM and was treatment # 9 out of a planned course of 10 treatments.  [facility] Therapy Treatment Record Phone [phone]   Radiation Therapy was administered to Matthew White on: 05/31/2014  3:23 PM and was treatment # 4 out of a planned course of 8 treatments.

## 2014-05-31 NOTE — Care Management Note (Unsigned)
    Page 1 of 1   05/31/2014     2:52:41 PM CARE MANAGEMENT NOTE 05/31/2014  Patient:  Matthew White, Matthew White   Account Number:  [id number]  Date Initiated:  05/31/2014  Documentation initiated by:  [facility]  Subjective/Objective Assessment:   80 year old male admitted with Lip Abscess for IV Abx and I and D.     Action/Plan:   From home, needs [facility] services for home TNA.   Anticipated DC Date:  06/03/2014   Anticipated DC Plan:  [place]  CM consult      Choice offered to / List presented to:  [name] Spouse        HH arranged  HH-1 RN      [place].   Status of service:   Medicare Important Message given?   (If response is "NO", the following Medicare IM given date fields will be blank) Date Medicare IM given:   Date Additional Medicare IM given:    Discharge Disposition:    Per UR Regulation:  Reviewed for med. necessity/level of care/duration of stay  If discussed at [place] of Stay Meetings, dates discussed:    Comments:  05/31/14 Allene Dillon RN [name] [phone] Met with pt and spouse at bedside to discuss home TNA. They chose [facility] to provide the services. Referral made to [facility]. MD will need to place final TNA orders and HHRN orders in EPIC at d/c.

## 2014-05-31 NOTE — Progress Notes (Signed)
Subjective: Admitted yesterday c Lip Abscess for IV Abx and I and D. Appreciate ENT and Onc evals Started on TNA. Face looks much better -  Pain controlled  Objective: Vital signs in last 24 hours: Temp:  [97.3 F (36.3 C)-98.7 F (37.1 C)] 97.3 F (36.3 C) (06/19 0648) Pulse Rate:  [76-91] 76 (06/19 0648) Resp:  [18] 18 (06/19 0648) BP: (108-126)/(54-61) 108/60 mmHg (06/19 0648) SpO2:  [98 %-100 %] 98 % (06/19 0648) Weight:  [62.4 kg (137 lb 9.1 oz)-63.73 kg (140 lb 8 oz)] 62.4 kg (137 lb 9.1 oz) (06/19 0500) Weight change:  Last BM Date: 05/29/14  CBG (last 3)   Recent Labs  05/30/14 1638 05/31/14 0028 05/31/14 0645  GLUCAP 93 149* 246*    Intake/Output from previous day:  Intake/Output Summary (Last 24 hours) at 05/31/14 0724 Last data filed at 05/31/14 0527  Gross per 24 hour  Intake      0 ml  Output    800 ml  Net   -800 ml   06/18 0701 - 06/19 0700 In: 0  Out: 800 [Urine:800]   Physical Exam  General appearance: Thin cachectic frail Eyes: no scleral icterus Lip - much less swollen - face much less swollen.  No nasal pus. Resp: CTA Cardio: reg GI: soft, non-tender; bowel sounds normal; no masses,  no organomegaly Extremities: no clubbing, cyanosis or edema   Lab Results:  Recent Labs  05/30/14 1530 05/31/14 0410  NA 139 136*  K 4.1 4.9  CL 102 100  CO2 24 26  GLUCOSE 105* 188*  BUN 63* 48*  CREATININE 1.14 0.91  CALCIUM 9.7 9.5  MG 2.1 2.0  PHOS 3.2 3.0     Recent Labs  05/30/14 1530 05/31/14 0410  AST 12 15  ALT 9 10  ALKPHOS 91 94  BILITOT 0.3 0.6  PROT 6.3 5.9*  ALBUMIN 2.8* 2.7*     Recent Labs  05/30/14 1530 05/31/14 0410  WBC 12.8* 10.8*  NEUTROABS  --  9.7*  HGB 10.7* 10.5*  HCT 31.7* 31.7*  MCV 99.1 99.7  PLT 271 235    Lab Results  Component Value Date   INR 1.01 05/30/2014   INR 1.04 05/03/2014   INR 1.00 03/07/2014    No results found for this basename: CKTOTAL, CKMB, CKMBINDEX, TROPONINI,  in the  last 72 hours   Recent Labs  05/30/14 1530  TSH 1.270    No results found for this basename: VITAMINB12, FOLATE, FERRITIN, TIBC, IRON, RETICCTPCT,  in the last 72 hours  Micro Results: Recent Results (from the past 240 hour(s))  MRSA PCR SCREENING     Status: Abnormal   Collection Time    05/30/14  2:56 PM      Result Value Ref Range Status   MRSA by PCR POSITIVE (*) NEGATIVE Final   Comment:            The GeneXpert MRSA Assay (FDA     approved for NASAL specimens     only), is one component of a     comprehensive MRSA colonization     surveillance program. It is not     intended to diagnose MRSA     infection nor to guide or     monitor treatment for     MRSA infections.     RESULT CALLED TO, READ BACK BY AND VERIFIED WITH:     Trude Mcburney RN [id number] 05/30/14 A NAVARRO     Studies/Results:  No results found.   Medications: Scheduled: . antiseptic oral rinse  15 mL Mouth Rinse q12n4p  . chlorhexidine  15 mL Mouth Rinse BID  . Chlorhexidine Gluconate Cloth  6 each Topical Q0600  . dexamethasone  4 mg Oral Q12H  . insulin aspart  0-9 Units Subcutaneous Q6H  . mupirocin ointment  1 application Nasal BID  . OxyCODONE  20 mg Oral Q12H  . pantoprazole  40 mg Oral Daily  . piperacillin-tazobactam  3.375 g Intravenous 3 times per day  . vancomycin  750 mg Intravenous Q12H   Continuous: . sodium chloride 50 mL/hr at 05/31/14 0654  . Marland KitchenTPN (CLINIMIX-E) Adult 40 mL/hr at 05/30/14 1812   And  . fat emulsion 250 mL (05/30/14 1812)     Assessment/Plan: Active Problems:   Facial abscess   Protein-calorie malnutrition, severe   1. Left upper lip abcsess  S/P I and D. On Zosyn/Vanco Penrose drain in two sites and will plan to remove them in two days. Continue IV antibiotics until certainly improving  2. Espohageal Cancer  On XRT. Finished Xeloda about 3 weeks ago.  Onc reports that they will discuss with radiation oncology to hold palliative radiation therapy this  week. They also stated - Patient has an appointment to see Dr. Benay Spice after completion of palliative radiation therapy to discuss further treatment options. For nutrition continue TPN for now, hoping his intake will improve once his infection resolves with I / D and antibiotics. If his nutrition does not improve in few days, consider J-tube placement. Appreciate dietitian evaluation.  Longevity and goals of care to be discussed.  He is an Teacher, English as a foreign language.  He is much more upbeat and optimistic then expected.  He and I talked and he is now DNR.   If we do TNA or J tube feeds - if Onc declares him terminal and nothing further to offer - he would have to be willing to give it up.  He is.  3. Dysphagia  Secondary to 1 and 2  For swallowing study end of June.  I ordered ST to see while in house.  Possible TPN via port versus J tube. TPN started yesterday Strongly considering D/cing pt on TNA for 1-2 weeks of treatment.   4. Back pain/ Hip pain  Secondary to malignancy.  Controlled with current regimen  On palliative radiation therapy   5. DNR -   6.  Sacral Decub - Wound care.  Publishing copy.  7. Diabetes Mellitus  CBGs are fine then shot up once TNA started.  Continue ISS but add back some Lantus while on the TNA. -  Recent Labs  05/30/14 1638 05/31/14 0028 05/31/14 0645  GLUCAP 93 149* 246*   ID -  Anti-infectives   Start     Dose/Rate Route Frequency Ordered Stop   05/30/14 1500  vancomycin (VANCOCIN) IVPB 750 mg/150 ml premix     750 mg 150 mL/hr over 60 Minutes Intravenous Every 12 hours 05/30/14 1401     05/30/14 1400  piperacillin-tazobactam (ZOSYN) IVPB 3.375 g     3.375 g 12.5 mL/hr over 240 Minutes Intravenous 3 times per day 05/30/14 1302       DVT Prophylaxis - SCDs.  Annd low dose Lovenox    LOS: 1 day   RUSSO,JOHN M 05/31/2014, 7:24 AM

## 2014-05-31 NOTE — Progress Notes (Signed)
NUTRITION FOLLOW UP  Intervention:   -Recommend Boost Plus QID -Provided pt with Boost recipes and nutrition supplement coupons -Diet advancement per MD -TPN per pharmacy -Will continue to monitor  Nutrition Dx:   Inadequate oral intake related to inability to eat as evidenced by NPO status- progressing with full liquid diet   Goal:   Pt to meet >/= 90% of their estimated nutrition needs -progressing with TPN and diet advancement   Monitor:   TPN tolerance, diet order, total protein/energy intake, labs, weights, diet education needs  Assessment:   6/18: -Pt endorsed ongoing unintentional wt loss d/t decreased appetite, loose stools, and difficulty swallowing  -Prior to cancer dx, pt weighed around 200 lbs. Weight declined with chemo and radiation treatments to 180 lbs. Was able to maintain weight with J-tube. J-tube was removed, which resulted in further weight loss to around 155 lbs. Pt had been able to maintain weight until recently, whereas he has lost 15 lbs in 3 months (9.6% body weight, severe for time frame)  -Pt requires puree-like foods d/t esophageal surgeries. Diet recall indicates pt trying to consume 5-6 Boost Plus/daily with small amounts of soft foods-yogurt, mashed potatoes, soups, oatmeal. However, pt also reported ongoing loose stools, therefore questionable to how much of nutrients are being absorbed  -Currently NPO. Will f/u with pt and pt's wife for supplement coupons and nutrition supplement recipes for additional resources  -Pt to begin on TPN. Phos/Mg/K WNL  6/19: -Pt diet advanced to Full liquid. Has not yet ordered meals. Will order Boost QID to continue with home supplement regimen. Swallow study to be done at end of June per MD note -Provided pt with nutrition supplement coupons and recipes for d/c. Pt to review with wife, encouraged to contact with questions -WOC evaluated pt's pressure ulcer on 6/18 -Pt to continue with home TPN for 1-2 weeks per MD  note -Pharmacy recommended Clinimix 5/15 formula + Lipids at goal rate of 100 ml/hr -This will provide 2184 kcal (99% est kcal needs), 120 gram protein (100% est protein needs) -Clinimix E 5/15 is currently at 40 ml/hr and fat emulsion 20% at 10 ml/min. Plan to hold advance d/t elevated CBGS -Phos/K/Mg WNL. Prealbumin pending   Height: Ht Readings from Last 1 Encounters:  05/30/14 6\' 1"  (1.854 m)    Weight Status:   Wt Readings from Last 1 Encounters:  05/31/14 137 lb 9.1 oz (62.4 kg)    Re-estimated needs:  Kcal: 2200-2400  Protein: 115-125 gram  Fluid: 2200 ml/daily   Skin: Stage 1 PU on sacrum, Stage 2 PU on coccyx  Diet Order: Full Liquid   Intake/Output Summary (Last 24 hours) at 05/31/14 0843 Last data filed at 05/31/14 0655  Gross per 24 hour  Intake 1865.83 ml  Output   1400 ml  Net 465.83 ml    Last BM: 6/17   Labs:   Recent Labs Lab 05/27/14 1434 05/30/14 1530 05/31/14 0410  NA 141 139 136*  K 3.9 4.1 4.9  CL  --  102 100  CO2 27 24 26   BUN 41.7* 63* 48*  CREATININE 0.9 1.14 0.91  CALCIUM 9.7 9.7 9.5  MG  --  2.1 2.0  PHOS  --  3.2 3.0  GLUCOSE 84 105* 188*    CBG (last 3)   Recent Labs  05/30/14 1638 05/31/14 0028 05/31/14 0645  GLUCAP 93 149* 246*    Scheduled Meds: . antiseptic oral rinse  15 mL Mouth Rinse q12n4p  . chlorhexidine  15 mL Mouth Rinse BID  . Chlorhexidine Gluconate Cloth  6 each Topical Q0600  . dexamethasone  4 mg Oral Q12H  . enoxaparin (LOVENOX) injection  30 mg Subcutaneous Q24H  . insulin aspart  0-15 Units Subcutaneous 6 times per day  . insulin glargine  10 Units Subcutaneous QHS  . lactose free nutrition  237 mL Oral TID WC & HS  . mupirocin ointment  1 application Nasal BID  . OxyCODONE  20 mg Oral Q12H  . pantoprazole  40 mg Oral Daily  . piperacillin-tazobactam  3.375 g Intravenous 3 times per day  . vancomycin  750 mg Intravenous Q12H    Continuous Infusions: . sodium chloride 10 mL/hr at  05/31/14 0802  . Marland KitchenTPN (CLINIMIX-E) Adult 40 mL/hr at 05/30/14 1812   And  . fat emulsion 250 mL (05/30/14 1812)    Atlee Abide MS RD LDN Clinical Dietitian [phone]

## 2014-05-31 NOTE — Progress Notes (Addendum)
Matthew White   DOB:Nov 13, 1934   DH#:[id number]   MIW#:[id number]  Subjective:  Patient feels much better after I&D drainage. His pain is improving Continue to get TPN. He received radiation therapy today Complains of decrease in appetite and trouble swallowing but it is stable  Objective: Moderately placed and moderately nourished not in acute distress Filed Vitals:   05/31/14 1405  BP: 123/60  Pulse: 91  Temp: 97.9 F (36.6 C)  Resp: 16    Body mass index is 18.15 kg/(m^2).  Intake/Output Summary (Last 24 hours) at 05/31/14 1852 Last data filed at 05/31/14 0839  Gross per 24 hour  Intake 1865.83 ml  Output   1650 ml  Net 215.83 ml   HEENT PERRLA, sclerae anicteric conjunctiva no pallor neck supple no JVD no thyromegaly  Left upper lip abscess: status post I&D yesterday, swelling and erythema is improving  Oropharynx clear  No peripheral adenopathy  Lungs clear -- no rales or rhonchi  Heart regular rate and rhythm  Abdomen benign  MSK no focal spinal tenderness  Neuro nonfocal  Extremities: No edema, no cyanosis no clubbing    Recent Labs  05/31/14 0645 05/31/14 1053 05/31/14 1727  GLUCAP 246* 124* 287*     Labs:  Lab Results  Component Value Date   WBC 10.8* 05/31/2014   HGB 10.5* 05/31/2014   HCT 31.7* 05/31/2014   MCV 99.7 05/31/2014   PLT 235 05/31/2014   NEUTROABS 9.7* 05/31/2014    @LASTCHEMISTRY @  Urine Studies No results found for this basename: UACOL, UAPR, USPG, UPH, UTP, UGL, UKET, UBIL, UHGB, UNIT, UROB, ULEU, UEPI, UWBC, URBC, UBAC, CAST, CRYS, UCOM, BILUA,  in the last 72 hours  Basic Metabolic Panel:  Recent Labs Lab 05/27/14 1434  05/30/14 1530 05/31/14 0410 05/31/14 0919  NA 141  --  139 136*  --   K 3.9  < > 4.1 4.9 4.8  CL  --   --  102 100  --   CO2 27  --  24 26  --   GLUCOSE 84  --  105* 188*  --   BUN 41.7*  --  63* 48*  --   CREATININE 0.9  --  1.14 0.91  --   CALCIUM 9.7  --  9.7 9.5  --   MG  --   --  2.1 2.0  --   PHOS   --   --  3.2 3.0  --   < > = values in this interval not displayed. GFR Estimated Creatinine Clearance: 60 ml/min (by C-G formula based on Cr of 0.91). Liver Function Tests:  Recent Labs Lab 05/30/14 1530 05/31/14 0410  AST 12 15  ALT 9 10  ALKPHOS 91 94  BILITOT 0.3 0.6  PROT 6.3 5.9*  ALBUMIN 2.8* 2.7*   No results found for this basename: LIPASE, AMYLASE,  in the last 168 hours No results found for this basename: AMMONIA,  in the last 168 hours Coagulation profile  Recent Labs Lab 05/30/14 1530  INR 1.01    CBC:  Recent Labs Lab 05/30/14 1530 05/31/14 0410  WBC 12.8* 10.8*  NEUTROABS  --  9.7*  HGB 10.7* 10.5*  HCT 31.7* 31.7*  MCV 99.1 99.7  PLT 271 235   Cardiac Enzymes: No results found for this basename: CKTOTAL, CKMB, CKMBINDEX, TROPONINI,  in the last 168 hours BNP: No components found with this basename: POCBNP,  CBG:  Recent Labs Lab 05/30/14 1638 05/31/14 0028 05/31/14 0645 05/31/14  1053 05/31/14 1727  GLUCAP 93 149* 246* 124* 287*   D-Dimer No results found for this basename: DDIMER,  in the last 72 hours Hgb A1c  Recent Labs  05/30/14 1530  HGBA1C 6.4*   Lipid Profile  Recent Labs  05/31/14 0410  TRIG 100   Thyroid function studies  Recent Labs  05/30/14 1530  TSH 1.270   Anemia work up No results found for this basename: VITAMINB12, FOLATE, FERRITIN, TIBC, IRON, RETICCTPCT,  in the last 72 hours Microbiology Recent Results (from the past 240 hour(s))  MRSA PCR SCREENING     Status: Abnormal   Collection Time    05/30/14  2:56 PM      Result Value Ref Range Status   MRSA by PCR POSITIVE (*) NEGATIVE Final   Comment:            The GeneXpert MRSA Assay (FDA     approved for NASAL specimens     only), is one component of a     comprehensive MRSA colonization     surveillance program. It is not     intended to diagnose MRSA     infection nor to guide or     monitor treatment for     MRSA infections.      RESULT CALLED TO, READ BACK BY AND VERIFIED WITH:     Trude Mcburney RN [id number] 05/30/14 A [facility]ARRO  WOUND CULTURE     Status: None   Collection Time    05/30/14  9:53 PM      Result Value Ref Range Status   Specimen Description ABSCESS UPPER LIP   Final   Special Requests NONE   Final   Gram Stain     Final   Value: ABUNDANT WBC PRESENT,BOTH PMN AND MONONUCLEAR     NO SQUAMOUS EPITHELIAL CELLS SEEN     FEW GRAM POSITIVE COCCI IN CLUSTERS     Performed at [facility]   Culture     Final   Value: NO GROWTH     Performed at [facility]   Report Status PENDING   Incomplete   Assessmnent/Plan:80 y.o. male admitted on with   1. Left upper lip abcsess  Continue Vancomycin/Zosyn  S/P I and D   2. Metastatic Espohageal Cancer  On palliative XRT. Finished Xeloda about 3 weeks ago. Patient says he would like to discuss with Dr. Benay Spice next week regarding his treatment options and goals of care.  3. Dysphagia  Secondary to 1 and 2  Follow up with swallowing studies ordered Continue TPN   4. Decrease in appetite: We'll initiate t Megace liquid suspension 800 mg by mouth once daily  5. Back pain/ Hip pain  Secondary to malignancy.  Controlled with current regimen  On palliative radiation therapy   6. Diabetes Mellitus   7. DNR   Wilmon Arms, MD 05/31/2014  6:52 PM

## 2014-05-31 NOTE — Progress Notes (Signed)
PARENTERAL NUTRITION CONSULT NOTE - INITIAL  Pharmacy Consult for TNA Indication: esophageal cancer with weight loss  Allergies  Allergen Reactions  . Ivp Dye [Iodinated Diagnostic Agents] Swelling    13 hr prep/reaction was 20 years ago per patient    Patient Measurements: Height: 6\' 1"  (185.4 cm) Weight: 137 lb 9.1 oz (62.4 kg) IBW/kg (Calculated) : 79.9 Actual Body Weight: 64.2 Usual Body Weight: ~81kg a few month ago  Vital Signs: Temp: 97.3 F (36.3 C) (06/19 0648) Temp src: Oral (06/19 0648) BP: 108/60 mmHg (06/19 0648) Pulse Rate: 76 (06/19 0648) Intake/Output from previous day: 06/18 0701 - 06/19 0700 In: 0  Out: 800 [Urine:800] Intake/Output from this shift:    Labs:  Recent Labs  05/30/14 1530 05/31/14 0410  WBC 12.8* 10.8*  HGB 10.7* 10.5*  HCT 31.7* 31.7*  PLT 271 235  APTT 25  --   INR 1.01  --      Recent Labs  05/30/14 1530 05/31/14 0410  NA 139 136*  K 4.1 4.9  CL 102 100  CO2 24 26  GLUCOSE 105* 188*  BUN 63* 48*  CREATININE 1.14 0.91  CALCIUM 9.7 9.5  MG 2.1 2.0  PHOS 3.2 3.0  PROT 6.3 5.9*  ALBUMIN 2.8* 2.7*  AST 12 15  ALT 9 10  ALKPHOS 91 94  BILITOT 0.3 0.6  TRIG  --  100   Estimated Creatinine Clearance: 60 ml/min (by C-G formula based on Cr of 0.91).    Recent Labs  05/30/14 1638 05/31/14 0028 05/31/14 0645  GLUCAP 93 149* 246*    Medical History: Past Medical History  Diagnosis Date  . Gout     no flare up in ten years   . Barrett esophagus   . Esophageal cancer 12/22/2012    invasive adenocarcinoma arising in a background of barrett's mucosa  . Diabetes mellitus without complication     type II; last A1C 6.5 10/2012  . GERD (gastroesophageal reflux disease)     managed by prilosec  . Hx of pleural effusion 10/2013    left. pt states it was drained at [facility].     Medications:  Scheduled:  . antiseptic oral rinse  15 mL Mouth Rinse q12n4p  . chlorhexidine  15 mL Mouth Rinse BID  . Chlorhexidine  Gluconate Cloth  6 each Topical Q0600  . dexamethasone  4 mg Oral Q12H  . enoxaparin (LOVENOX) injection  30 mg Subcutaneous Q24H  . insulin aspart  0-9 Units Subcutaneous Q6H  . insulin glargine  10 Units Subcutaneous QHS  . mupirocin ointment  1 application Nasal BID  . OxyCODONE  20 mg Oral Q12H  . pantoprazole  40 mg Oral Daily  . piperacillin-tazobactam  3.375 g Intravenous 3 times per day  . vancomycin  750 mg Intravenous Q12H   Infusions:  . sodium chloride 50 mL/hr at 05/31/14 0654  . Marland KitchenTPN (CLINIMIX-E) Adult 40 mL/hr at 05/30/14 1812   And  . fat emulsion 250 mL (05/30/14 1812)    Insulin Requirements in the past 24 hours:  Lantus 10 units QHS Sensitive SSI - 9 units  Home regimen for Hx DM  Lantus 10 units QHS  Insulin lispro SSI 0-8 unit slide scale if CBG > 210  Current Nutrition:  NPO  IV Access: PAC  Assessment: 80 year old man directly admitted today with dysphagia and facial abscess, possibly to get I&D. Patient on Vancomycin and Zosyn, started 6/18. Note patient with hx adenocarcinoma of distal esophagus  with last cycle of CAPOX chemo on 5/18. Restaging chest CT on 6/5 showed multiple bone metastases. Patient also stopped oral chemo Xeloda approx 2-3 weeks ago but continuing XRT. Plan to start TNA per pharmacy for metastatic esophageal cancer with severe malnurition and weight loss.  Patient has ongoing unintential weight lose due to decreased appetite, loose stools and difficulty swallowing     CBGs:  Hx DM, CBGs were WNL but have elevated since start of TNA (93, 149, 188, 246); Patient did not receive Lantus dose last night, has been restarted for today.   Electrolytes: Na slightly low 136, cannot adjust in TNA.  Potassium trending up 4.9 (4.1 yesterday) - possibly from not receiving Lantus last night and startingTNA, no additional K supplementation ordered , Phos/mg WNL  Renal: SCr stable at 0.91, CrCl 60  Liver: WNL  Prealbumin: Pending for  6/19  TGs: 100 (6/19)   Nutritional Goals:  Per RD estimated nutritional needs: ,  Kcal 2200-2400 protein 115-125 g Fluids: 2200 mL/day   Clinimix 5/15 formula + Lipids at goal rate of 100 ml/hr to provide 2400 mL, 120g protein and 2184 kcal per day  Plan:   Continue Clinimix E 5/15 at 40 ml/hr and fat emulsion 20% at 10 ml/min.  Will not advance today given elevated CBGs.  Plan to advance as tolerated to goal rate of 100 ml/hr.   Obtain repeat potassium level this morning to ensure not rising too high   Elevated CBGs - Restart Lantus 10 units QHS, Increase SSI to moderate scale.  CBG checks Q4h  TNA to contain MVI and TE daily   TNA labs on Mondays and Thursdays  Follow up daily    Borgerding, Gaye Alken PharmD Pager #: [phone] 8:07 AM 05/31/2014

## 2014-05-31 NOTE — Progress Notes (Addendum)
CRITICAL VALUE ALERT  Critical value received:  +MRSA PCR Date of notification:  05/30/14  Time of notification:  1940  Critical value read back:yes  Nurse who received alert: Azzie Glatter, RN  MD notified (1st page):  Reynaldo Minium  Time of first page:  1945  MD notified (2nd page): Reynaldo Minium  Time of second page: 2015  Responding MD:  Reynaldo Minium  Time MD responded: 2020 (MD was made aware that protocol order set was placed)

## 2014-05-31 NOTE — Progress Notes (Signed)
Utilization review completed.  

## 2014-05-31 NOTE — Evaluation (Signed)
Clinical/Bedside Swallow Evaluation Patient Details  Name: Matthew White MRN: [id number] Date of Birth: 01-17-1934  Today's Date: 05/31/2014 Time: 3154-0086 SLP Time Calculation (min): 31 min  Past Medical History:  Past Medical History  Diagnosis Date  . Gout     no flare up in ten years   . Barrett esophagus   . Esophageal cancer 12/22/2012    invasive adenocarcinoma arising in a background of barrett's mucosa  . Diabetes mellitus without complication     type II; last A1C 6.5 10/2012  . GERD (gastroesophageal reflux disease)     managed by prilosec  . Hx of pleural effusion 10/2013    left. pt states it was drained at [facility].    Past Surgical History:  Past Surgical History  Procedure Laterality Date  . Nissen fundoplication    . Tonsillectomy    . Laryngoscopy    . Esophagogastroduodenoscopy  10/18/2012    Procedure: ESOPHAGOGASTRODUODENOSCOPY (EGD);  Surgeon: Winfield Cunas., MD;  Location: [facility];  Service: Endoscopy;  Laterality: N/A;  need xray  . Savory dilation  10/18/2012    Procedure: SAVORY DILATION;  Surgeon: Winfield Cunas., MD;  Location: [facility];  Service: Endoscopy;  Laterality: N/A;  . Esophagogastroduodenoscopy  12/22/2012    Procedure: ESOPHAGOGASTRODUODENOSCOPY (EGD);  Surgeon: Winfield Cunas., MD;  Location: [facility];  Service: Endoscopy;  Laterality: N/A;  with C-Arm  . Savory dilation  12/22/2012    Procedure: SAVORY DILATION;  Surgeon: Winfield Cunas., MD;  Location: [facility];  Service: Endoscopy;  Laterality: N/A;  . Eus  01/10/2013    Procedure: ESOPHAGEAL ENDOSCOPIC ULTRASOUND (EUS) RADIAL;  Surgeon: Arta Silence, MD;  Location: [facility];  Service: Endoscopy;  Laterality: N/A;  . Removal of breast tissue    . Esophagectomy  04/20/2013  . Larynx surgery  11/2013    LARYNGEAL REPAIR   . Cholecystectomy    . Femur im nail Right 05/03/2014    Procedure: INTRAMEDULLARY (IM) NAIL FEMORAL;  Surgeon: Alta Corning, MD;   Location: [place];  Service: Orthopedics;  Laterality: Right;   HPI:  80 y.o. male with hx of adenocarcinoma of the distal esophagus and esophagogastrectomy 04/29/13 admitted with left upper lip abcsess; drained and treated with abx.  Hx includes radiation on 01/24/2013 -completed a first cycle of weekly Taxol/carboplatin on 01/25/2013; chemotherapy completed 02/22/2013, radiation completed 02/27/2013; odynophagia/early satiety, postoperative hoarseness-status post collagen injection therapy at [place], significant weight loss which stabilized with J-tube - this has since been removed.  Hx aspiration PNA last November.  Pt on TPN and advanced 6/19  to full liquids.  Per RD notes, has preferred purees/liquids for ease of eating.  Pt was scheduled for an OPMBS at [facility] on 06/03/14.       Assessment / Plan / Recommendation Clinical Impression  Pt with hx of dysphagia s/p esophagogastrectomy with recent decline in function and continued weight loss.  Describes weaker phonation, increased globus sensation, intermittent coughing in conjunction with liquid consumption.  Eval today confirms pt's described symptoms.  He was scheduled for an OP MBS on Monday, 6/22.  Given complexity of history, including Nov hospitalization for aspiration pna per wife, recommend proceeding with MBS while pt is an inpatient.  He is scheduled for radiation this afternoon; will plan for MBS next date.      Aspiration Risk    mod   Diet Recommendation  (continue full liquids as ordered)   Liquid Administration via: Cup;Straw  Medication Administration: Via alternative means Supervision: Patient able to self feed Compensations: Multiple dry swallows after each bite/sip;Small sips/bites    Other  Recommendations Recommended Consults: MBS Oral Care Recommendations: Oral care BID   Follow Up Recommendations   (tba)    SLP Swallow Goals   PENDING RESULTS OF MBS  Swallow Study Prior Functional Status       General  Type of Study:  Bedside swallow evaluation Previous Swallow Assessment: none per records Diet Prior to this Study:  (full liquids) Temperature Spikes Noted: No Respiratory Status: Room air History of Recent Intubation: No Behavior/Cognition: Alert;Cooperative;Pleasant mood Oral Cavity - Dentition: Edentulous Self-Feeding Abilities: Able to feed self Patient Positioning: Upright in chair Baseline Vocal Quality: Low vocal intensity Volitional Cough: Weak Volitional Swallow: Able to elicit    Oral/Motor/Sensory Function Overall Oral Motor/Sensory Function: Appears within functional limits for tasks assessed (presence of drain left maxilla for abscess)   Ice Chips Ice chips: Not tested   Thin Liquid Thin Liquid: Impaired Presentation: Cup;Straw Pharyngeal  Phase Impairments: Multiple swallows;Throat Clearing - Delayed    Nectar Thick Nectar Thick Liquid: Not tested   Honey Thick Honey Thick Liquid: Not tested   Puree Puree: Impaired Presentation: Self Fed Pharyngeal Phase Impairments: Multiple swallows   Solid  Amanda L. Fromberg, [place] CCC/SLP Pager [phone]     Solid: Not tested       Juan Quam Laurice 05/31/2014,11:49 AM

## 2014-05-31 NOTE — Evaluation (Signed)
Physical Therapy Evaluation Patient Details Name: Matthew White MRN: [id number] DOB: Feb 14, 1934 Today's Date: 05/31/2014   History of Present Illness  80 yo male admitted with L cheek/facial abscess. hx of esophageal cancer, gout, DM, sacral decubitus, path R femur fx s/p IM nail 5/23  Clinical Impression  On eval, pt required Min assist for bed mobility, Min guard assist for ambulation distance of ~115 feet with walker. Mobility limited by fatigue, weakness (especially R LE).     Follow Up Recommendations Home health PT;Supervision/Assistance - 24 hour    Equipment Recommendations  None recommended by PT    Recommendations for Other Services OT consult     Precautions / Restrictions Precautions Precautions: Fall Restrictions Weight Bearing Restrictions: No      Mobility  Bed Mobility Overal bed mobility: Needs Assistance Bed Mobility: Supine to Sit;Sit to Supine     Supine to sit: Min guard Sit to supine: Min assist   General bed mobility comments: Assist for R LE onto bed.   Transfers Overall transfer level: Needs assistance Equipment used: Rolling walker (2 wheeled) Transfers: Sit to/from Stand Sit to Stand: Min guard;From elevated surface         General transfer comment: close guard for safety  Ambulation/Gait Ambulation/Gait assistance: Min guard Ambulation Distance (Feet): 115 Feet Assistive device: Rolling walker (2 wheeled) Gait Pattern/deviations: Step-to pattern;Step-through pattern;Trunk flexed     General Gait Details: R LE "giving way" towards end of distance. close guard for safety. VCs safety, distance from RW, sequence  Stairs            Wheelchair Mobility    Modified Rankin (Stroke Patients Only)       Balance                                             Pertinent Vitals/Pain 2/10 at rest, 7/10 with activity; back/ R LE    Home Living                        Prior Function                  Hand Dominance        Extremity/Trunk Assessment   Upper Extremity Assessment: Defer to OT evaluation           Lower Extremity Assessment: Generalized weakness;RLE deficits/detail RLE Deficits / Details: hip flex 2/5, knee ext at least 3/5.    Cervical / Trunk Assessment: Normal  Communication      Cognition Arousal/Alertness: Awake/alert Behavior During Therapy: WFL for tasks assessed/performed Overall Cognitive Status: Within Functional Limits for tasks assessed                      General Comments      Exercises        Assessment/Plan    PT Assessment Patient needs continued PT services  PT Diagnosis Difficulty walking;Abnormality of gait;Generalized weakness   PT Problem List Decreased strength;Decreased activity tolerance;Decreased mobility;Decreased balance;Pain  PT Treatment Interventions DME instruction;Gait training;Functional mobility training;Therapeutic activities;Therapeutic exercise;Patient/family education;Balance training   PT Goals (Current goals can be found in the Care Plan section) Acute Rehab PT Goals Patient Stated Goal: to get R LE stronger PT Goal Formulation: With patient Time For Goal Achievement: 06/14/14 Potential to Achieve Goals: Good    Frequency Min  3X/week   Barriers to discharge        Co-evaluation               End of Session   Activity Tolerance: Patient limited by fatigue;Patient limited by pain Patient left: in bed;with call bell/phone within reach           Time: 1230-1243 PT Time Calculation (min): 13 min   Charges:   PT Evaluation $Initial PT Evaluation Tier I: 1 Procedure PT Treatments $Gait Training: 8-22 mins   PT G Codes:          Weston Anna, MPT Pager: [phone]

## 2014-06-01 ENCOUNTER — Inpatient Hospital Stay (HOSPITAL_COMMUNITY): Payer: Medicare PPO

## 2014-06-01 LAB — GLUCOSE, CAPILLARY
GLUCOSE-CAPILLARY: 237 mg/dL — AB (ref 70–99)
Glucose-Capillary: 154 mg/dL — ABNORMAL HIGH (ref 70–99)
Glucose-Capillary: 166 mg/dL — ABNORMAL HIGH (ref 70–99)
Glucose-Capillary: 203 mg/dL — ABNORMAL HIGH (ref 70–99)
Glucose-Capillary: 131 mg/dL — ABNORMAL HIGH (ref 70–99)

## 2014-06-01 LAB — BASIC METABOLIC PANEL
BUN: 37 mg/dL — AB (ref 6–23)
CALCIUM: 9.5 mg/dL (ref 8.4–10.5)
CO2: 26 mEq/L (ref 19–32)
CREATININE: 0.83 mg/dL (ref 0.50–1.35)
Chloride: 99 mEq/L (ref 96–112)
GFR calc Af Amer: >90 mL/min (ref >90)
GFR calc non Af Amer: 83 mL/min — ABNORMAL LOW (ref >90)
Glucose, Bld: 152 mg/dL — ABNORMAL HIGH (ref 70–99)
Potassium: 4.4 mEq/L (ref 3.7–5.3)
Sodium: 138 mEq/L (ref 137–147)

## 2014-06-01 MED ORDER — M.V.I. ADULT IV INJ
INTRAVENOUS | Status: AC
  Administered 2014-06-01: 18:00:00 via INTRAVENOUS
  Filled 2014-06-01: qty 2000

## 2014-06-01 MED ORDER — FAT EMULSION 20 % IV EMUL
250.0000 mL | INTRAVENOUS | Status: AC
Start: 2014-06-01 — End: 2014-06-02
  Administered 2014-06-01: 250 mL via INTRAVENOUS
  Filled 2014-06-01: qty 250

## 2014-06-01 NOTE — Progress Notes (Signed)
Doing much better, feels great. Remaining drain removed easily. No swelling residual.  Can probably transition to oral antibiotics tomorrow. With complete 2 more weeks on oral antibiotic. Contact us if additional concerns arise.

## 2014-06-01 NOTE — Progress Notes (Addendum)
[place] NOTE  Pharmacy Consult for TNA Indication: esophageal cancer with weight loss  Allergies  Allergen Reactions  . Ivp Dye [Iodinated Diagnostic Agents] Swelling    13 hr prep/reaction was 20 years ago per patient    Patient Measurements: Height: 6\' 1"  (185.4 cm) Weight: 143 lb 8.3 oz (65.1 kg) IBW/kg (Calculated) : 79.9 Actual Body Weight: 64.2 Usual Body Weight: ~81kg a few month ago  Vital Signs: Temp: 97.1 F (36.2 C) (06/20 0500) Temp src: Oral (06/20 0500) BP: 104/54 mmHg (06/20 0500) Pulse Rate: 69 (06/20 0500) Intake/Output from previous day: 06/19 0701 - 06/20 0700 In: 360 [P.O.:360] Out: 250 [Urine:250] Intake/Output from this shift:    Labs:  Recent Labs  05/30/14 1530 05/31/14 0410  WBC 12.8* 10.8*  HGB 10.7* 10.5*  HCT 31.7* 31.7*  PLT 271 235  APTT 25  --   INR 1.01  --      Recent Labs  05/30/14 1530 05/31/14 0410 05/31/14 0919 06/01/14 0615  NA 139 136*  --  138  K 4.1 4.9 4.8 4.4  CL 102 100  --  99  CO2 24 26  --  26  GLUCOSE 105* 188*  --  152*  BUN 63* 48*  --  37*  CREATININE 1.14 0.91  --  0.83  CALCIUM 9.7 9.5  --  9.5  MG 2.1 2.0  --   --   PHOS 3.2 3.0  --   --   PROT 6.3 5.9*  --   --   ALBUMIN 2.8* 2.7*  --   --   AST 12 15  --   --   ALT 9 10  --   --   ALKPHOS 91 94  --   --   BILITOT 0.3 0.6  --   --   PREALBUMIN  --  22.1  --   --   TRIG  --  100  --   --    Estimated Creatinine Clearance: 68.6 ml/min (by C-G formula based on Cr of 0.83).    Recent Labs  05/31/14 2004 05/31/14 2348 06/01/14 0406  GLUCAP 132* 137* 237*    Insulin Requirements in the past 24 hours:  Lantus 10 units QHS Moderate SSI - 16 units  Home regimen for Hx DM  Lantus 10 units QHS  Insulin lispro SSI 0-8 unit slide scale if CBG > 210  Current Nutrition:  FLD, starting 6/19, some po intake yesterday.  Pt has poor appetite and trouble swallowing. Clinimix E 5/15 at 40 ml/hr, lipids at 10  ml/hr  Nutritional Goals:  Per RD estimated nutritional needs: ,  Kcal 2200-2400 protein 115-125 g Fluids: 2200 mL/day   Goal Clinimix E 5/15 at 100 ml/hr + Lipids 20% at 10 ml/hr provide 2400 mL, 120g protein and 2184 kcal per day.  [facility]: [facility]  IV Access: PAC (placed 03/11/14)  Assessment: 80 year old man directly admitted today with dysphagia and facial abscess, possibly to get I&D. Patient on Vancomycin and Zosyn, started 6/18. Note patient with hx adenocarcinoma of distal esophagus with last cycle of CAPOX chemo on 5/18. Restaging chest CT on 6/5 showed multiple bone metastases. Patient also stopped oral chemo Xeloda approx 2-3 weeks ago but continuing XRT. Plan to start TNA per pharmacy for metastatic esophageal cancer with severe malnurition and weight loss.  Patient has ongoing unintential weight lose due to decreased appetite, loose stools and difficulty swallowing     Day #3 TNA  CBGs:  Hx DM, some CBGs uncontrolled with Lantus and SSI.  Will add insulin to TNA.  Electrolytes: WNL  Renal: SCr WNL  LFTs: WNL  Prealbumin: good at 22.1 (6/19)  TGs: 100 (6/19)   Plan:   Increase Clinimix E 5/15 to 60 ml/hr.  Add 10 units/L regular insulin to TNA.  Patient will receive ~14 units from TNA over 24 hours at current infusion rate.    Continue fat emulsion 20% at 10 ml/hr.  TNA to contain MVI and TE daily   Continue CBG checks q4h with moderate SSI coverage + PTA Lantus 10 units qhs.  TNA labs on Mondays and Thursdays  Follow up daily.  BMET in AM.  Monitor patient's tolerance to oral diet.   Hershal Coria, PharmD, BCPS Pager: [phone] 06/01/2014 8:53 AM

## 2014-06-01 NOTE — Progress Notes (Signed)
Subjective: Patient in much better spirits, skills tremendously better that last drain has been removed, did place his dentures back into place, denies any chills, interestingly pain and energy level have improved dramatically with IV antibiotics per the observation of the patient and his wife, especially with respect to pain in the rod that was placed in his right leg approximately 3 weeks ago. Eating marginal amounts of soft food, continuing to get TNA, denies any respiratory difficulties, or chest pain. Wife did meet with home health nurses regarding TNA and IV antibiotics at home this morning.  Objective: Vital signs in last 24 hours: Temp:  [97.1 F (36.2 C)-98.1 F (36.7 C)] 97.1 F (36.2 C) (06/20 0500) Pulse Rate:  [69-91] 69 (06/20 0500) Resp:  [16-18] 16 (06/20 0500) BP: (104-123)/(54-64) 104/54 mmHg (06/20 0500) SpO2:  [99 %] 99 % (06/20 0500) Weight:  [65.1 kg (143 lb 8.3 oz)] 65.1 kg (143 lb 8.3 oz) (06/20 0500) Weight change: 1.37 kg (3 lb 0.3 oz) Last BM Date: 05/29/14  CBG (last 3)   Recent Labs  05/31/14 2348 06/01/14 0406 06/01/14 0857  GLUCAP 137* 237* 154*    Intake/Output from previous day: 06/19 0701 - 06/20 0700 In: 360 [P.O.:360] Out: 250 [Urine:250] Intake/Output this shift: Total I/O In: -  Out: 500 [Urine:500]  Physical exam Appropriate alert and oriented x3 in good spirits answering questions appropriately following commands Sclera anicteric extraconal movements are intact Dentures in place, but no posterior pharyngeal lesions, subjectively left-sided facial swelling near the lip significantly improved per wife and patient report, no significant erythema visualized by myself Neck supple, no cervical lymphadenopathy Lungs clear to auscultation bilaterally Regular rate and rhythm Abdomen soft, nontender, nondistended No edema Patient able to move all 4 extremities but gait not assessed and weightbearing status not assessed however was ambulating  slowly this morning for wife's report with decreased pain in the right leg   Lab Results:  Recent Labs  05/30/14 1530 05/31/14 0410 05/31/14 0919 06/01/14 0615  NA 139 136*  --  138  K 4.1 4.9 4.8 4.4  CL 102 100  --  99  CO2 24 26  --  26  GLUCOSE 105* 188*  --  152*  BUN 63* 48*  --  37*  CREATININE 1.14 0.91  --  0.83  CALCIUM 9.7 9.5  --  9.5  MG 2.1 2.0  --   --   PHOS 3.2 3.0  --   --     Recent Labs  05/30/14 1530 05/31/14 0410  AST 12 15  ALT 9 10  ALKPHOS 91 94  BILITOT 0.3 0.6  PROT 6.3 5.9*  ALBUMIN 2.8* 2.7*    Recent Labs  05/30/14 1530 05/31/14 0410  WBC 12.8* 10.8*  NEUTROABS  --  9.7*  HGB 10.7* 10.5*  HCT 31.7* 31.7*  MCV 99.1 99.7  PLT 271 235   No results found for this basename: CKTOTAL, CKMB, CKMBINDEX, TROPONINI,  in the last 72 hours  Recent Labs  05/30/14 1530  TSH 1.270   No results found for this basename: VITAMINB12, FOLATE, FERRITIN, TIBC, IRON, RETICCTPCT,  in the last 72 hours  Studies/Results: No results found.   Medications: Scheduled: . antiseptic oral rinse  15 mL Mouth Rinse q12n4p  . chlorhexidine  15 mL Mouth Rinse BID  . Chlorhexidine Gluconate Cloth  6 each Topical Q0600  . dexamethasone  4 mg Oral Q12H  . doxycycline  100 mg Oral Q12H  . enoxaparin (LOVENOX)  injection  30 mg Subcutaneous Q24H  . feeding supplement (ENSURE)  1 Container Oral TID BM  . insulin aspart  0-15 Units Subcutaneous 6 times per day  . insulin glargine  10 Units Subcutaneous QHS  . lactose free nutrition  237 mL Oral TID WC & HS  . megestrol  800 mg Oral Daily  . mupirocin ointment  1 application Nasal BID  . OxyCODONE  20 mg Oral Q12H  . pantoprazole  40 mg Oral Daily  . vancomycin  750 mg Intravenous Q12H   Continuous: . sodium chloride 10 mL/hr at 05/31/14 0802  . Marland KitchenTPN (CLINIMIX-E) Adult 40 mL/hr at 05/31/14 1710   And  . fat emulsion 250 mL (05/31/14 1709)  . Marland KitchenTPN (CLINIMIX-E) Adult     And  . fat emulsion       Assessment/Plan: Active Problems:   Facial abscess status post incision and drainage by otolaryngology, drains removed this morning, feels much better, pressure decreased, continues on vancomycin and Zosyn, with cultures growing out staph aureus however MRSA growing out of nasal swabs, final culture results pending, interestingly significant pain reduction in the right leg with antibiotic treatment, question occult issues per wife's report, will continue IV antibiotics for the next one to 2 days and then transitioned to vancomycin and doxycycline her doxycycline by itself. Drains just removed this morning, we'll need to observe   Protein-calorie malnutrition, severe-continues on TNA collocated by metastatic esophageal cancer, surgical changes, inability to maintain caloric intake with 14-15 pound weight loss in recent weeks, may need to transition to enteral feeding if appropriate based on sections with PCP and oncology, Megace initiated by oncology Dysphasia-see above Lower extremity pain, back pain-status post recent rod and placement, pain improved, but thought to be secondary to latency, receiving palliative radiation therapy question whether pain relief is secondary to palliative radiation therapy versus antibiotic treatment as observed by family, no leukocytosis Diabetes currently on Lantus and sliding scale insulin with bump in CBGs after TNA initiated, first dose of Lantus less than 24 hours go will monitor DVT prophylaxis with SCDs and low-dose Lovenox Disposition-watchful observation after drains removed, we'll followup on staph aureus ID and sensitivity however may just may need empiric treatment for MRSA based on nasal swabs, question discharge on doxycycline alone, anticipate observation for the next one-2 days and discharged on Monday or Tuesday with TNA and followup with PCP and oncology   LOS: 2 days   AVVA,RAVISANKAR R 06/01/2014, 11:47 AM

## 2014-06-01 NOTE — Procedures (Addendum)
Objective Swallowing Evaluation: Modified Barium Swallowing Study  Patient Details  Name: Matthew White MRN: [id number] Date of Birth: Jan 06, 1934  Today's Date: 06/01/2014 Time: 4401-0272 SLP Time Calculation (min): 36 min  Past Medical History:  Past Medical History  Diagnosis Date  . Gout     no flare up in ten years   . Barrett esophagus   . Esophageal cancer 12/22/2012    invasive adenocarcinoma arising in a background of barrett's mucosa  . Diabetes mellitus without complication     type II; last A1C 6.5 10/2012  . GERD (gastroesophageal reflux disease)     managed by prilosec  . Hx of pleural effusion 10/2013    left. pt states it was drained at [facility].    Past Surgical History:  Past Surgical History  Procedure Laterality Date  . Nissen fundoplication    . Tonsillectomy    . Laryngoscopy    . Esophagogastroduodenoscopy  10/18/2012    Procedure: ESOPHAGOGASTRODUODENOSCOPY (EGD);  Surgeon: Winfield Cunas., MD;  Location: [facility];  Service: Endoscopy;  Laterality: N/A;  need xray  . Savory dilation  10/18/2012    Procedure: SAVORY DILATION;  Surgeon: Winfield Cunas., MD;  Location: [facility];  Service: Endoscopy;  Laterality: N/A;  . Esophagogastroduodenoscopy  12/22/2012    Procedure: ESOPHAGOGASTRODUODENOSCOPY (EGD);  Surgeon: Winfield Cunas., MD;  Location: [facility];  Service: Endoscopy;  Laterality: N/A;  with C-Arm  . Savory dilation  12/22/2012    Procedure: SAVORY DILATION;  Surgeon: Winfield Cunas., MD;  Location: [facility];  Service: Endoscopy;  Laterality: N/A;  . Eus  01/10/2013    Procedure: ESOPHAGEAL ENDOSCOPIC ULTRASOUND (EUS) RADIAL;  Surgeon: Arta Silence, MD;  Location: [facility];  Service: Endoscopy;  Laterality: N/A;  . Removal of breast tissue    . Esophagectomy  04/20/2013  . Larynx surgery  11/2013    LARYNGEAL REPAIR   . Cholecystectomy    . Femur im nail Right 05/03/2014    Procedure: INTRAMEDULLARY (IM) NAIL FEMORAL;   Surgeon: Alta Corning, MD;  Location: [place];  Service: Orthopedics;  Laterality: Right;   HPI:  80 y.o. male with hx of adenocarcinoma of the distal esophagus and esophagogastrectomy 04/29/13 admitted with left upper lip abcsess; drained and treated with abx.  Hx includes radiation on 01/24/2013 -completed a first cycle of weekly Taxol/carboplatin on 01/25/2013; chemotherapy completed 02/22/2013, radiation completed 02/27/2013; odynophagia/early satiety, postoperative hoarseness-status post collagen injection therapy at [facility], significant weight loss which stabilized with J-tube - this has since been removed; pt on TPN and advanced 6/19  to full liquids.  Per RD notes, has preferred purees/liquids for ease of eating.  Pt was scheduled for outpt MBS on 6/22 but MD ordered eval while in house.       Assessment / Plan / Recommendation Clinical Impression  Dysphagia Diagnosis: Mild oral phase dysphagia;Moderate pharyngeal phase dysphagia- known h/o esophagogastrectomy  Clinical impression: Mild oral and moderate pharyngeal phase dysphagia due to decreased tongue base retraction and compromised laryngeal elevation resulting in vallecular/pharyngeal stasis and penetration of liquids.  Pt does not always sense pharyngeal stasis but does conduct reflexive multiple swallows across consistencies.  Chin tuck and head turn left posture significantly effective to prevent accumulation in pharynx and protect airway.  Pt reports he was informed to conduct his posture by a doctor but he forgets "at times".  He was able to "hock" and remove approx 50% of vallecular stasis that he did  not transit through pharynx.   Following solids with liquids also decreased vallecular stasis.  Barium tablet not given due to concern for aspiration.     Pt appeared with delayed clearance distally without awareness, water swallows faciliate clearance, advised pt to consume several small meal/day and take frequent rest breaks if sense poor  clearance.   Pt aware of esophagogastrectomy and lack of peristalsis requiring gravity to clear po intake.    Recommend consider diet advancement to dys3/ground meats/thin liquids with strict aspiration/stomach clearance precautions- consuming multiple small meals/day.    Using video monitor and teach back, SLP provided feedback and reiforcement of effective compensation strategies.   Pt is aware of his dysphagia and suspect it is chronic based on interview and findings.      Treatment Recommendation  Therapy as outlined in treatment plan below    Diet Recommendation Dysphagia 3 (Mechanical Soft);Thin liquid   Liquid Administration via: Cup;Straw Medication Administration: Crushed with puree Supervision: Patient able to self feed Compensations: Multiple dry swallows after each bite/sip;Small sips/bites;Follow solids with liquid Postural Changes and/or Swallow Maneuvers: Chin tuck;Head turn left during swallow (hock and expectorate at end of meal, consume several small meals)    Other  Recommendations Oral Care Recommendations: Oral care BID   Follow Up Recommendations    TBD   Frequency and Duration min 1 x/week  1 week   Pertinent Vitals/Pain Afebrile,decreased     General HPI: 80 y.o. male with hx of adenocarcinoma of the distal esophagus and esophagogastrectomy 04/29/13 admitted with left upper lip abcsess; drained and treated with abx.  Hx includes radiation on 01/24/2013 -completed a first cycle of weekly Taxol/carboplatin on 01/25/2013; chemotherapy completed 02/22/2013, radiation completed 02/27/2013; odynophagia/early satiety, postoperative hoarseness-status post collagen injection therapy at [place], significant weight loss which stabilized with J-tube - this has since been removed; pt on TPN and advanced 6/19  to full liquids.  Per RD notes, has preferred purees/liquids for ease of eating.  Pt was scheduled for outpt MBS on 6/22 but MD ordered eval while in house.   Type of Study:  Modified Barium Swallowing Study Reason for Referral: Objectively evaluate swallowing function Previous Swallow Assessment: none per records- pt reports ho Respiratory Status: Room air History of Recent Intubation: No Behavior/Cognition: Alert;Cooperative;Pleasant mood Oral Cavity - Dentition: Edentulous;Dentures, top;Dentures, bottom Oral Motor / Sensory Function: Within functional limits Self-Feeding Abilities: Able to feed self Patient Positioning: Upright in bed Baseline Vocal Quality: Low vocal intensity Volitional Cough: Weak Volitional Swallow: Able to elicit Anatomy: Within functional limits Pharyngeal Secretions: Standing secretions in (comment)    Reason for Referral Objectively evaluate swallowing function   Oral Phase Oral Preparation/Oral Phase Oral Phase: Impaired Oral - Nectar Oral - Nectar Teaspoon: Weak lingual manipulation;Lingual pumping;Delayed oral transit;Piecemeal swallowing;Reduced posterior propulsion Oral - Nectar Cup: Weak lingual manipulation;Lingual pumping;Delayed oral transit;Piecemeal swallowing;Reduced posterior propulsion Oral - Thin Oral - Thin Teaspoon: Weak lingual manipulation;Lingual pumping;Delayed oral transit;Piecemeal swallowing;Reduced posterior propulsion Oral - Thin Cup: Weak lingual manipulation;Lingual pumping;Delayed oral transit;Piecemeal swallowing;Reduced posterior propulsion Oral - Thin Straw: Weak lingual manipulation;Lingual pumping;Delayed oral transit;Piecemeal swallowing;Reduced posterior propulsion Oral - Solids Oral - Puree: Weak lingual manipulation;Lingual pumping;Delayed oral transit;Piecemeal swallowing;Reduced posterior propulsion Oral - Mechanical Soft: Weak lingual manipulation;Lingual pumping;Delayed oral transit;Impaired mastication;Piecemeal swallowing;Reduced posterior propulsion   Pharyngeal Phase Pharyngeal Phase Pharyngeal Phase: Impaired Pharyngeal - Nectar Pharyngeal - Nectar Cup: Reduced laryngeal  elevation;Reduced epiglottic inversion;Pharyngeal residue - valleculae;Reduced airway/laryngeal closure;Reduced tongue base retraction;Reduced pharyngeal peristalsis Pharyngeal - Thin Pharyngeal - Thin Teaspoon:  Pharyngeal residue - valleculae;Reduced laryngeal elevation;Reduced epiglottic inversion;Reduced airway/laryngeal closure;Reduced tongue base retraction;Reduced pharyngeal peristalsis Pharyngeal - Thin Cup: Reduced airway/laryngeal closure;Reduced laryngeal elevation;Pharyngeal residue - valleculae;Reduced epiglottic inversion;Reduced tongue base retraction;Reduced pharyngeal peristalsis;Penetration/Aspiration during swallow Penetration/Aspiration details (thin cup): Material enters airway, CONTACTS cords then ejected out Pharyngeal - Thin Straw: Reduced pharyngeal peristalsis;Reduced tongue base retraction;Reduced epiglottic inversion;Reduced laryngeal elevation;Reduced airway/laryngeal closure;Pharyngeal residue - valleculae Pharyngeal - Solids Pharyngeal - Puree: Pharyngeal residue - valleculae;Reduced laryngeal elevation;Reduced airway/laryngeal closure;Reduced epiglottic inversion;Reduced tongue base retraction;Reduced pharyngeal peristalsis Pharyngeal - Regular: Premature spillage to valleculae;Reduced pharyngeal peristalsis;Reduced tongue base retraction Penetration/Aspiration details (regular): Material does not enter airway Pharyngeal Phase - Comment Pharyngeal Comment: head turn left and chin tuck decreased pharyngeal stasis, follow solids with liquids, pt "hawked" and expectorated upon cue to decrease pharyngeal stasis   Cervical Esophageal Phase    GO    Cervical Esophageal Phase Cervical Esophageal Phase: Impaired Cervical Esophageal Phase - Comment Cervical Esophageal Comment: pt appeared with delayed clearance distally without awareness, water swallows faciliate clearance, advised pt to consume several small meal/day and take frequent rest breaks if sense poor clearance,  pt aware of esophagectomy and lack of peristalsis to clear po intake Pt appears with anterior cervical osteophytes *minimal* near C5-C6, did not appear to impact barium clearance and radiologist not present to confirm findings         Claudie Fisherman, [facility] SLP [phone]

## 2014-06-02 LAB — BASIC METABOLIC PANEL
BUN: 40 mg/dL — ABNORMAL HIGH (ref 6–23)
CALCIUM: 9.8 mg/dL (ref 8.4–10.5)
CO2: 28 meq/L (ref 19–32)
CREATININE: 0.84 mg/dL (ref 0.50–1.35)
Chloride: 98 mEq/L (ref 96–112)
GFR calc Af Amer: >90 mL/min (ref >90)
GFR calc non Af Amer: 82 mL/min — ABNORMAL LOW (ref >90)
Glucose, Bld: 65 mg/dL — ABNORMAL LOW (ref 70–99)
Potassium: 4.8 mEq/L (ref 3.7–5.3)
Sodium: 136 mEq/L — ABNORMAL LOW (ref 137–147)

## 2014-06-02 LAB — GLUCOSE, CAPILLARY
GLUCOSE-CAPILLARY: 107 mg/dL — AB (ref 70–99)
GLUCOSE-CAPILLARY: 164 mg/dL — AB (ref 70–99)
GLUCOSE-CAPILLARY: 179 mg/dL — AB (ref 70–99)
Glucose-Capillary: 118 mg/dL — ABNORMAL HIGH (ref 70–99)
Glucose-Capillary: 149 mg/dL — ABNORMAL HIGH (ref 70–99)
Glucose-Capillary: 201 mg/dL — ABNORMAL HIGH (ref 70–99)
Glucose-Capillary: 85 mg/dL (ref 70–99)

## 2014-06-02 LAB — WOUND CULTURE

## 2014-06-02 LAB — VANCOMYCIN, TROUGH: Vancomycin Tr: 18.3 ug/mL (ref 10.0–20.0)

## 2014-06-02 MED ORDER — TRACE MINERALS CR-CU-F-FE-I-MN-MO-SE-ZN IV SOLN
INTRAVENOUS | Status: DC
  Administered 2014-06-02: 18:00:00 via INTRAVENOUS
  Filled 2014-06-02: qty 2000

## 2014-06-02 MED ORDER — FAT EMULSION 20 % IV EMUL
250.0000 mL | INTRAVENOUS | Status: DC
  Administered 2014-06-02: 250 mL via INTRAVENOUS
  Filled 2014-06-02: qty 250

## 2014-06-02 NOTE — Progress Notes (Signed)
[place] NOTE  Pharmacy Consult for TNA Indication: esophageal cancer with weight loss  Allergies  Allergen Reactions  . Ivp Dye [Iodinated Diagnostic Agents] Swelling    13 hr prep/reaction was 78 years ago per patient    Patient Measurements: Height: 6\' 1"  (185.4 cm) Weight: 143 lb 8.3 oz (65.1 kg) IBW/kg (Calculated) : 79.9 Actual Body Weight: 64.2 Usual Body Weight: ~81kg a few month ago  Vital Signs: Temp: 97.5 F (36.4 C) (06/21 0540) Temp src: Oral (06/21 0540) BP: 99/53 mmHg (06/21 0540) Pulse Rate: 65 (06/21 0540) Intake/Output from previous day: 06/20 0701 - 06/21 0700 In: 360 [P.O.:360] Out: 1976 [Urine:1975; Stool:1] Intake/Output from this shift:    Labs:  Recent Labs  05/30/14 1530 05/31/14 0410  WBC 12.8* 10.8*  HGB 10.7* 10.5*  HCT 31.7* 31.7*  PLT 271 235  APTT 25  --   INR 1.01  --      Recent Labs  05/30/14 1530 05/31/14 0410 05/31/14 0919 06/01/14 0615 06/02/14 0622  NA 139 136*  --  138 136*  K 4.1 4.9 4.8 4.4 4.8  CL 102 100  --  99 98  CO2 24 26  --  26 28  GLUCOSE 105* 188*  --  152* 65*  BUN 63* 48*  --  37* 40*  CREATININE 1.14 0.91  --  0.83 0.84  CALCIUM 9.7 9.5  --  9.5 9.8  MG 2.1 2.0  --   --   --   PHOS 3.2 3.0  --   --   --   PROT 6.3 5.9*  --   --   --   ALBUMIN 2.8* 2.7*  --   --   --   AST 12 15  --   --   --   ALT 9 10  --   --   --   ALKPHOS 91 94  --   --   --   BILITOT 0.3 0.6  --   --   --   PREALBUMIN  --  22.1  --   --   --   TRIG  --  100  --   --   --    Estimated Creatinine Clearance: 67.8 ml/min (by C-G formula based on Cr of 0.84).    Recent Labs  06/01/14 2016 06/02/14 0017 06/02/14 0407  GLUCAP 131* 107* 164*    Insulin Requirements in the past 24 hours:  Lantus 10 units QHS Moderate SSI - 16 units Regular insulin 10 units/L in TNA (receiving ~14 units from current bag)  Home regimen for Hx DM  Lantus 10 units QHS  Insulin lispro SSI 0-8 unit slide scale if  CBG > 210  Current Nutrition:  FLD, starting 6/19, some po intake yesterday.  Pt has poor appetite and trouble swallowing. Clinimix E 5/15 at 60 ml/hr, lipids at 10 ml/hr  Nutritional Goals:  Per RD estimated nutritional needs: ,  Kcal 2200-2400 protein 115-125 g Fluids: 2200 mL/day   Goal Clinimix E 5/15 at 100 ml/hr + Lipids 20% at 10 ml/hr provide 2400 mL, 120g protein and 2184 kcal per day.  IVF: NS at [facility]  IV Access: PAC (placed 03/11/14)  Assessment: 80 year old man directly admitted today with dysphagia and facial abscess, possibly to get I&D. Patient on Vancomycin and Zosyn, started 6/18. Note patient with hx adenocarcinoma of distal esophagus with last cycle of CAPOX chemo on 5/18. Restaging chest CT on 6/5 showed multiple bone metastases. Patient  also stopped oral chemo Xeloda approx 2-3 weeks ago but continuing XRT. Plan to start TNA per pharmacy for metastatic esophageal cancer with severe malnurition and weight loss.  Patient has ongoing unintential weight lose due to decreased appetite, loose stools and difficulty swallowing     Day #4 TNA  CBGs:  Hx DM, CBGs improved after new bag of TNA started last night containing 10 units/L regular insulin.  Also on home Lantus and SSI.    Electrolytes: Na slightly low, other lytes ok  Renal: SCr WNL, BUN elevated but down from admission  LFTs: WNL  Prealbumin: good at 22.1 (6/19)  TGs: 100 (6/19)  Megace started by oncology for decreased appetite.   Plan:   Increase Clinimix E 5/15 to 80 ml/hr.  Add 10 units/L regular insulin to TNA.  Patient will receive ~19 units from TNA over 24 hours at current infusion rate.    Continue fat emulsion 20% at 10 ml/hr.  TNA to contain MVI and TE daily   Continue CBG checks q4h with moderate SSI coverage + PTA Lantus 10 units qhs.  TNA labs on Mondays and Thursdays  Follow up daily.  Monitor patient's tolerance to oral diet or possible transition to enteral feeding based on  secretions.   Hershal Coria, PharmD, BCPS Pager: [phone] 06/02/2014 7:20 AM

## 2014-06-02 NOTE — Progress Notes (Signed)
ANTIBIOTIC CONSULT NOTE - FOLLOW UP  Pharmacy Consult for Vancomcyin Indication: facial abscess  Allergies  Allergen Reactions  . Ivp Dye [Iodinated Diagnostic Agents] Swelling    13 hr prep/reaction was 20 years ago per patient    Patient Measurements: Height: 6\' 1"  (185.4 cm) Weight: 143 lb 8.3 oz (65.1 kg) IBW/kg (Calculated) : 79.9   Vital Signs: Temp: 97.5 F (36.4 C) (06/21 1352) Temp src: Oral (06/21 1352) BP: 93/53 mmHg (06/21 1352) Pulse Rate: 81 (06/21 1352) Intake/Output from previous day: 06/20 0701 - 06/21 0700 In: 360 [P.O.:360] Out: 1976 [KCLEX:5170; Stool:1] Intake/Output from this shift: Total I/O In: 120 [P.O.:120] Out: 300 [Urine:300]  Labs:  Recent Labs  05/30/14 1530 05/31/14 0410 06/01/14 0615 06/02/14 0622  WBC 12.8* 10.8*  --   --   HGB 10.7* 10.5*  --   --   PLT 271 235  --   --   CREATININE 1.14 0.91 0.83 0.84   Estimated Creatinine Clearance: 67.8 ml/min (by C-G formula based on Cr of 0.84).  Recent Labs  06/02/14 1400  VANCOTROUGH 18.3     Assessment: 80 year old man admitted with dysphagia and facial abscess, s/p I&D on 6/18.  Note patient with hx adenocarcinoma of distal esophagus with last cycle of CAPOX chemo on 5/18. Restaging chest CT on 6/5 showed multiple bone metastases. Pharmacy consulted to start vancomycin.  MD also started doxycycline for facial abscess, which growing MRSA.  6/18 >> Zosyn >> 6/20 6/18 >> vancomycin >> 6/20 >> doxy >>   Tmax: afebrile WBCs: 10.8 Renal: Scr improved to 0.84, CrCl 67CG  6/18 wound >> MRSA (S- vanc/tetra/septra) 6/18 MRSA PCR >> positive  Day #4 Vancomycin 750mg  IV q12 and Day #2 doxycycline 100 mg BID for MRSA facial abscess.  Vancomycin trough therapeutic today.  Goal of Therapy:  Vancomycin trough level 15-20 mcg/ml  Plan:  1.  Continue Vancomycin 750 mg IV q12h. 2.  F/u plan for antibiotic at discharge per MD.   Hershal Coria 06/02/2014,3:22 PM

## 2014-06-02 NOTE — Progress Notes (Signed)
Staff called to room by patient's wife and found patient sitting in floor beside his bed. Patient Stated he slid off side of bed to his knees while attempting to use the urinal. Assisted to standing position and returned to bed. Patient states he is not in pain and denies injury. Will off er ice for knees. MD Dr Ardeth Perfect notified

## 2014-06-02 NOTE — Progress Notes (Signed)
CRITICAL VALUE ALERT  Critical value received:  Wound culture MRSA+  Date of notification:  06/02/14  Time of notification:  0071  Critical value read back:yes  Nurse who received alert:  Jyl Heinz  MD notified (1st page):  Dr. Shanda Bumps  Time of first page:  1119  MD notified (2nd page):  Time of second page:  Responding MD:  Dr. Shanda Bumps  Time MD responded:  1125

## 2014-06-02 NOTE — Progress Notes (Addendum)
Subjective: Patient did spirits, supported by wife, denies significant pain in the face, continues to have some pain in the lower extremity status post rod placement 3 weeks ago, denies overt fevers, chills, some loose stools yesterday but none this morning, still has dentures in place status post drain removal, denies any drainage, denies orders of breath, chest pain, subjective chills. Energy level stable. Eating marginal amounts of soft food, getting TNA, Wife did meet with home health nurses regarding TNA and IV antibiotics at home this morning.  Objective: Vital signs in last 24 hours: Temp:  [97.5 F (36.4 C)-98.2 F (36.8 C)] 97.5 F (36.4 C) (06/21 0540) Pulse Rate:  [65-92] 65 (06/21 0540) Resp:  [16] 16 (06/21 0540) BP: (88-105)/(50-61) 99/53 mmHg (06/21 0540) SpO2:  [94 %-100 %] 100 % (06/21 0540) Weight change:  Last BM Date: 06/01/14  CBG (last 3)   Recent Labs  06/02/14 0017 06/02/14 0407 06/02/14 0807  GLUCAP 107* 164* 85    Intake/Output from previous day: 06/20 0701 - 06/21 0700 In: 360 [P.O.:360] Out: 1976 [Urine:1975; Stool:1] Intake/Output this shift:    Physical exam Appropriate alert and oriented x3 in good spirits answering questions appropriately following commands Sclera anicteric extraconal movements are intact Dentures in place, but no posterior pharyngeal lesions, subjectively left-sided facial swelling near the lip significantly improved per wife and patient report, no significant erythema visualized by myself Neck supple, no cervical lymphadenopathy Lungs clear to auscultation bilaterally Regular rate and rhythm Abdomen soft, nontender, nondistended No edema Patient able to move all 4 extremities but gait not assessed and weightbearing status not assessed however was ambulating slowly this morning for wife's report with decreased pain in the right leg   Lab Results:  Recent Labs  05/30/14 1530 05/31/14 0410  06/01/14 0615 06/02/14 0622   NA 139 136*  --  138 136*  K 4.1 4.9  < > 4.4 4.8  CL 102 100  --  99 98  CO2 24 26  --  26 28  GLUCOSE 105* 188*  --  152* 65*  BUN 63* 48*  --  37* 40*  CREATININE 1.14 0.91  --  0.83 0.84  CALCIUM 9.7 9.5  --  9.5 9.8  MG 2.1 2.0  --   --   --   PHOS 3.2 3.0  --   --   --   < > = values in this interval not displayed.  Recent Labs  05/30/14 1530 05/31/14 0410  AST 12 15  ALT 9 10  ALKPHOS 91 94  BILITOT 0.3 0.6  PROT 6.3 5.9*  ALBUMIN 2.8* 2.7*    Recent Labs  05/30/14 1530 05/31/14 0410  WBC 12.8* 10.8*  NEUTROABS  --  9.7*  HGB 10.7* 10.5*  HCT 31.7* 31.7*  MCV 99.1 99.7  PLT 271 235   No results found for this basename: CKTOTAL, CKMB, CKMBINDEX, TROPONINI,  in the last 72 hours  Recent Labs  05/30/14 1530  TSH 1.270   No results found for this basename: VITAMINB12, FOLATE, FERRITIN, TIBC, IRON, RETICCTPCT,  in the last 72 hours  Studies/Results: Dg Swallowing Func-speech Pathology  06/01/2014   Macario Golds, CCC-SLP     06/01/2014  7:15 PM Objective Swallowing Evaluation: Modified Barium Swallowing Study   Patient Details  Name: Matthew White MRN: [id number] Date of Birth: April 10, 1934  Today's Date: 06/01/2014 Time: 3762-8315 SLP Time Calculation (min): 36 min  Past Medical History:  Past Medical History  Diagnosis Date  .  Gout     no flare up in ten years   . Barrett esophagus   . Esophageal cancer 12/22/2012    invasive adenocarcinoma arising in a background of barrett's  mucosa  . Diabetes mellitus without complication     type II; last A1C 6.5 10/2012  . GERD (gastroesophageal reflux disease)     managed by prilosec  . Hx of pleural effusion 10/2013    left. pt states it was drained at [facility].    Past Surgical History:  Past Surgical History  Procedure Laterality Date  . Nissen fundoplication    . Tonsillectomy    . Laryngoscopy    . Esophagogastroduodenoscopy  10/18/2012    Procedure: ESOPHAGOGASTRODUODENOSCOPY (EGD);  Surgeon: Winfield Cunas., MD;   Location: [facility];  Service: Endoscopy;   Laterality: N/A;  need xray  . Savory dilation  10/18/2012    Procedure: SAVORY DILATION;  Surgeon: Winfield Cunas., MD;   Location: [facility];  Service: Endoscopy;  Laterality: N/A;  . Esophagogastroduodenoscopy  12/22/2012    Procedure: ESOPHAGOGASTRODUODENOSCOPY (EGD);  Surgeon: Winfield Cunas., MD;  Location: [facility];  Service: Endoscopy;   Laterality: N/A;  with C-Arm  . Savory dilation  12/22/2012    Procedure: SAVORY DILATION;  Surgeon: Winfield Cunas., MD;   Location: [facility];  Service: Endoscopy;  Laterality: N/A;  . Eus  01/10/2013    Procedure: ESOPHAGEAL ENDOSCOPIC ULTRASOUND (EUS) RADIAL;   Surgeon: Arta Silence, MD;  Location: [facility];  Service:  Endoscopy;  Laterality: N/A;  . Removal of breast tissue    . Esophagectomy  04/20/2013  . Larynx surgery  11/2013    LARYNGEAL REPAIR   . Cholecystectomy    . Femur im nail Right 05/03/2014    Procedure: INTRAMEDULLARY (IM) NAIL FEMORAL;  Surgeon: Alta Corning, MD;  Location: [facility];  Service: Orthopedics;  Laterality:  Right;   HPI:  80 y.o. male with hx of adenocarcinoma of the distal esophagus  and esophagogastrectomy 04/29/13 admitted with left upper lip  abcsess; drained and treated with abx.  Hx includes radiation on  01/24/2013 -completed a first cycle of weekly Taxol/carboplatin  on 01/25/2013; chemotherapy completed 02/22/2013, radiation  completed 02/27/2013; odynophagia/early satiety, postoperative  hoarseness-status post collagen injection therapy at [place],  significant weight loss which stabilized with J-tube - this has  since been removed; pt on TPN and advanced 6/19  to full liquids.   Per RD notes, has preferred purees/liquids for ease of eating.   Pt was scheduled for outpt MBS on 6/22 but MD ordered eval while  in house.       Assessment / Plan / Recommendation Clinical Impression  Dysphagia Diagnosis: Mild oral phase dysphagia;Moderate  pharyngeal phase dysphagia-  known h/o esophagogastrectomy  Clinical impression: Mild oral and moderate pharyngeal phase  dysphagia due to decreased tongue base retraction and compromised  laryngeal elevation resulting in vallecular/pharyngeal stasis and  penetration of liquids.  Pt does not always sense pharyngeal  stasis but does conduct reflexive multiple swallows across  consistencies.  Chin tuck and head turn left posture  significantly effective to prevent accumulation in pharynx and  protect airway.  Pt reports he was informed to conduct his  posture by a doctor but he forgets "at times".  He was able to  "hock" and remove approx 50% of vallecular stasis that he did not  transit through pharynx.   Following solids with liquids also  decreased vallecular stasis.  Barium tablet not given due to  concern for aspiration.     Pt appeared with delayed clearance distally without awareness,  water swallows faciliate clearance, advised pt to consume several  small meal/day and take frequent rest breaks if sense poor  clearance.   Pt aware of esophagogastrectomy and lack of  peristalsis requiring gravity to clear po intake.    Recommend consider diet advancement to dys3/ground meats/thin  liquids with strict aspiration/stomach clearance precautions-  consuming multiple small meals/day.    Using video monitor and teach back, SLP provided feedback and  reiforcement of effective compensation strategies.   Pt is aware  of his dysphagia and suspect it is chronic based on interview and  findings.      Treatment Recommendation  Therapy as outlined in treatment plan below    Diet Recommendation Dysphagia 3 (Mechanical Soft);Thin liquid   Liquid Administration via: Cup;Straw Medication Administration: Crushed with puree Supervision: Patient able to self feed Compensations: Multiple dry swallows after each bite/sip;Small  sips/bites;Follow solids with liquid Postural Changes and/or Swallow Maneuvers: Chin tuck;Head turn  left during swallow (hock and  expectorate at end of meal, consume  several small meals)    Other  Recommendations Oral Care Recommendations: Oral care BID   Follow Up Recommendations    TBD   Frequency and Duration min 1 x/week  1 week   Pertinent Vitals/Pain Afebrile,decreased     General HPI: 80 y.o. male with hx of adenocarcinoma of the distal  esophagus and esophagogastrectomy 04/29/13 admitted with left  upper lip abcsess; drained and treated with abx.  Hx includes  radiation on 01/24/2013 -completed a first cycle of weekly  Taxol/carboplatin on 01/25/2013; chemotherapy completed  02/22/2013, radiation completed 02/27/2013; odynophagia/early  satiety, postoperative hoarseness-status post collagen injection  therapy at [place], significant weight loss which stabilized with  J-tube - this has since been removed; pt on TPN and advanced 6/19   to full liquids.  Per RD notes, has preferred purees/liquids for  ease of eating.  Pt was scheduled for outpt MBS on 6/22 but MD  ordered eval while in house.   Type of Study: Modified Barium Swallowing Study Reason for Referral: Objectively evaluate swallowing function Previous Swallow Assessment: none per records- pt reports ho Respiratory Status: Room air History of Recent Intubation: No Behavior/Cognition: Alert;Cooperative;Pleasant mood Oral Cavity - Dentition: Edentulous;Dentures, top;Dentures,  bottom Oral Motor / Sensory Function: Within functional limits Self-Feeding Abilities: Able to feed self Patient Positioning: Upright in bed Baseline Vocal Quality: Low vocal intensity Volitional Cough: Weak Volitional Swallow: Able to elicit Anatomy: Within functional limits Pharyngeal Secretions: Standing secretions in (comment)    Reason for Referral Objectively evaluate swallowing function   Oral Phase Oral Preparation/Oral Phase Oral Phase: Impaired Oral - Nectar Oral - Nectar Teaspoon: Weak lingual manipulation;Lingual  pumping;Delayed oral transit;Piecemeal swallowing;Reduced  posterior propulsion Oral -  Nectar Cup: Weak lingual manipulation;Lingual  pumping;Delayed oral transit;Piecemeal swallowing;Reduced  posterior propulsion Oral - Thin Oral - Thin Teaspoon: Weak lingual manipulation;Lingual  pumping;Delayed oral transit;Piecemeal swallowing;Reduced  posterior propulsion Oral - Thin Cup: Weak lingual manipulation;Lingual  pumping;Delayed oral transit;Piecemeal swallowing;Reduced  posterior propulsion Oral - Thin Straw: Weak lingual manipulation;Lingual  pumping;Delayed oral transit;Piecemeal swallowing;Reduced  posterior propulsion Oral - Solids Oral - Puree: Weak lingual manipulation;Lingual pumping;Delayed  oral transit;Piecemeal swallowing;Reduced posterior propulsion Oral - Mechanical Soft: Weak lingual manipulation;Lingual  pumping;Delayed oral transit;Impaired mastication;Piecemeal  swallowing;Reduced posterior propulsion   Pharyngeal Phase Pharyngeal Phase Pharyngeal Phase: Impaired Pharyngeal - Nectar  Pharyngeal - Nectar Cup: Reduced laryngeal elevation;Reduced  epiglottic inversion;Pharyngeal residue - valleculae;Reduced  airway/laryngeal closure;Reduced tongue base retraction;Reduced  pharyngeal peristalsis Pharyngeal - Thin Pharyngeal - Thin Teaspoon: Pharyngeal residue -  valleculae;Reduced laryngeal elevation;Reduced epiglottic  inversion;Reduced airway/laryngeal closure;Reduced tongue base  retraction;Reduced pharyngeal peristalsis Pharyngeal - Thin Cup: Reduced airway/laryngeal closure;Reduced  laryngeal elevation;Pharyngeal residue - valleculae;Reduced  epiglottic inversion;Reduced tongue base retraction;Reduced  pharyngeal peristalsis;Penetration/Aspiration during swallow Penetration/Aspiration details (thin cup): Material enters  airway, CONTACTS cords then ejected out Pharyngeal - Thin Straw: Reduced pharyngeal peristalsis;Reduced  tongue base retraction;Reduced epiglottic inversion;Reduced  laryngeal elevation;Reduced airway/laryngeal closure;Pharyngeal  residue - valleculae Pharyngeal -  Solids Pharyngeal - Puree: Pharyngeal residue - valleculae;Reduced  laryngeal elevation;Reduced airway/laryngeal closure;Reduced  epiglottic inversion;Reduced tongue base retraction;Reduced  pharyngeal peristalsis Pharyngeal - Regular: Premature spillage to valleculae;Reduced  pharyngeal peristalsis;Reduced tongue base retraction Penetration/Aspiration details (regular): Material does not enter  airway Pharyngeal Phase - Comment Pharyngeal Comment: head turn left and chin tuck decreased  pharyngeal stasis, follow solids with liquids, pt "hawked" and  expectorated upon cue to decrease pharyngeal stasis   Cervical Esophageal Phase    GO    Cervical Esophageal Phase Cervical Esophageal Phase: Impaired Cervical Esophageal Phase - Comment Cervical Esophageal Comment: pt appeared with delayed clearance  distally without awareness, water swallows faciliate clearance,  advised pt to consume several small meal/day and take frequent  rest breaks if sense poor clearance, pt aware of esophagectomy  and lack of peristalsis to clear po intake Pt appears with anterior cervical osteophytes *minimal* near  C5-C6, did not appear to impact barium clearance and radiologist  not present to confirm findings         Claudie Fisherman, MS [facility] SLP [phone]      Medications: Scheduled: . antiseptic oral rinse  15 mL Mouth Rinse q12n4p  . chlorhexidine  15 mL Mouth Rinse BID  . Chlorhexidine Gluconate Cloth  6 each Topical Q0600  . dexamethasone  4 mg Oral Q12H  . doxycycline  100 mg Oral Q12H  . enoxaparin (LOVENOX) injection  30 mg Subcutaneous Q24H  . feeding supplement (ENSURE)  1 Container Oral TID BM  . insulin aspart  0-15 Units Subcutaneous 6 times per day  . insulin glargine  10 Units Subcutaneous QHS  . lactose free nutrition  237 mL Oral TID WC & HS  . megestrol  800 mg Oral Daily  . mupirocin ointment  1 application Nasal BID  . OxyCODONE  20 mg Oral Q12H  . pantoprazole  40 mg Oral Daily  .  vancomycin  750 mg Intravenous Q12H   Continuous: . sodium chloride 10 mL/hr at 05/31/14 0802  . Marland KitchenTPN (CLINIMIX-E) Adult 60 mL/hr at 06/01/14 1801   And  . fat emulsion 250 mL (06/01/14 1801)  . Marland KitchenTPN (CLINIMIX-E) Adult     And  . fat emulsion      Assessment/Plan: Active Problems:   Facial abscess status post incision and drainage by otolaryngology, drains removed 1 date, feels much better, pressure decreased, continues on vancomycin; and Zosyn changed over to doxycycline within the last 24 hours, with cultures growing out staph aureus however MRSA growing out of nasal swabs, final culture results pending, interestingly significant pain reduction in the right leg with antibiotic treatment, question occult issues per wife's report, will continue vancomycin and doxycycline her doxycycline pending culture results, question need for dual therapyDrains just removed this 24 hours ago, we'll need to observe   Protein-calorie malnutrition, severe-continues on TNA  collocated by metastatic esophageal cancer, surgical changes, inability to maintain caloric intake with 14-15 pound weight loss in recent weeks, may need to transition to enteral feeding if appropriate based on sections with PCP and oncology, Megace initiated by oncology, home health needs with face-to-face filled out online, for IV infusion of TNA as well as vancomycin, doses to be finalized tomorrow pending lab results by pharmacy Dysphasia-see above Lower extremity pain, back pain-status post recent rod and placement, pain improved, but thought to be secondary to latency, receiving palliative radiation therapy question whether pain relief is secondary to palliative radiation therapy versus antibiotic treatment as observed by family, no leukocytosis Diabetes currently on Lantus and sliding scale insulin with bump in CBGs after TNA initiated, first dose of Lantus on 6/19 much improved within the last 24 hours DVT prophylaxis with SCDs and  low-dose Lovenox Disposition-watchful observation after drains removed, we'll followup on staph aureus ID and sensitivity however may just may need empiric treatment for MRSA based on nasal swabs, question discharge on doxycycline [place] or minus vancomycin, anticipate observation for the next one days and discharged on Monday or Tuesday with TNA and followup with PCP and oncology   LOS: 3 days   AVVA,RAVISANKAR R 06/02/2014, 10:41 AM  Called by nursing, abscess culture positive for MRSA

## 2014-06-03 ENCOUNTER — Ambulatory Visit: Payer: Medicare PPO

## 2014-06-03 ENCOUNTER — Ambulatory Visit
Admission: RE | Admit: 2014-06-03 | Discharge: 2014-06-03 | Disposition: A | Discharge status: Home / Self Care | Payer: Medicare PPO | Source: Ambulatory Visit | Attending: Radiation Oncology | Admitting: Radiation Oncology

## 2014-06-03 ENCOUNTER — Ambulatory Visit (HOSPITAL_COMMUNITY): Payer: Medicare PPO

## 2014-06-03 ENCOUNTER — Other Ambulatory Visit (HOSPITAL_COMMUNITY): Payer: Medicare PPO

## 2014-06-03 DIAGNOSIS — R2681 Unsteadiness on feet: Secondary | ICD-10-CM | POA: Diagnosis present

## 2014-06-03 DIAGNOSIS — I959 Hypotension, unspecified: Secondary | ICD-10-CM | POA: Diagnosis present

## 2014-06-03 LAB — CBC
HEMATOCRIT: 31.6 % — AB (ref 39.0–52.0)
Hemoglobin: 10.7 g/dL — ABNORMAL LOW (ref 13.0–17.0)
MCH: 33.3 pg (ref 26.0–34.0)
MCHC: 33.9 g/dL (ref 30.0–36.0)
MCV: 98.4 fL (ref 78.0–100.0)
Platelets: 271 10*3/uL (ref 150–400)
RBC: 3.21 MIL/uL — ABNORMAL LOW (ref 4.22–5.81)
RDW: 19.5 % — AB (ref 11.5–15.5)
WBC: 9 10*3/uL (ref 4.0–10.5)

## 2014-06-03 LAB — COMPREHENSIVE METABOLIC PANEL
ALT: 8 U/L (ref 0–53)
AST: 12 U/L (ref 0–37)
Albumin: 2.6 g/dL — ABNORMAL LOW (ref 3.5–5.2)
Alkaline Phosphatase: 82 U/L (ref 39–117)
BILIRUBIN TOTAL: 0.3 mg/dL (ref 0.3–1.2)
BUN: 49 mg/dL — AB (ref 6–23)
CHLORIDE: 100 meq/L (ref 96–112)
CO2: 25 mEq/L (ref 19–32)
Calcium: 9.6 mg/dL (ref 8.4–10.5)
Creatinine, Ser: 0.73 mg/dL (ref 0.50–1.35)
GFR calc Af Amer: >90 mL/min (ref >90)
GFR, EST NON AFRICAN AMERICAN: 87 mL/min — AB (ref >90)
GLUCOSE: 114 mg/dL — AB (ref 70–99)
Potassium: 4.9 mEq/L (ref 3.7–5.3)
Sodium: 137 mEq/L (ref 137–147)
Total Protein: 5.9 g/dL — ABNORMAL LOW (ref 6.0–8.3)

## 2014-06-03 LAB — GLUCOSE, CAPILLARY
GLUCOSE-CAPILLARY: 114 mg/dL — AB (ref 70–99)
GLUCOSE-CAPILLARY: 187 mg/dL — AB (ref 70–99)
Glucose-Capillary: 139 mg/dL — ABNORMAL HIGH (ref 70–99)

## 2014-06-03 LAB — DIFFERENTIAL
BASOS ABS: 0 10*3/uL (ref 0.0–0.1)
BASOS PCT: 0 % (ref 0–1)
Eosinophils Absolute: 0 10*3/uL (ref 0.0–0.7)
Eosinophils Relative: 0 % (ref 0–5)
Lymphocytes Relative: 5 % — ABNORMAL LOW (ref 12–46)
Lymphs Abs: 0.5 10*3/uL — ABNORMAL LOW (ref 0.7–4.0)
MONO ABS: 1.3 10*3/uL — AB (ref 0.1–1.0)
Monocytes Relative: 14 % — ABNORMAL HIGH (ref 3–12)
NEUTROS PCT: 81 % — AB (ref 43–77)
Neutro Abs: 7.2 10*3/uL (ref 1.7–7.7)

## 2014-06-03 LAB — PREALBUMIN: Prealbumin: 23.5 mg/dL (ref 17.0–34.0)

## 2014-06-03 LAB — PHOSPHORUS: Phosphorus: 3.2 mg/dL (ref 2.3–4.6)

## 2014-06-03 LAB — MAGNESIUM: MAGNESIUM: 2.2 mg/dL (ref 1.5–2.5)

## 2014-06-03 LAB — TRIGLYCERIDES: Triglycerides: 80 mg/dL (ref <150)

## 2014-06-03 MED ORDER — DOXYCYCLINE HYCLATE 100 MG PO TABS: 100.0000 mg | ORAL_TABLET | Freq: Two times a day (BID) | ORAL | Status: DC

## 2014-06-03 MED ORDER — MUPIROCIN 2 % EX OINT: 1.0000 "application " | TOPICAL_OINTMENT | Freq: Two times a day (BID) | CUTANEOUS | Status: DC

## 2014-06-03 MED ORDER — VANCOMYCIN HCL IN DEXTROSE 750-5 MG/150ML-% IV SOLN: 750.0000 mg | Freq: Two times a day (BID) | INTRAVENOUS | Status: DC

## 2014-06-03 MED ORDER — HEPARIN SOD (PORK) LOCK FLUSH 100 UNIT/ML IV SOLN
500.0000 [IU] | INTRAVENOUS | Status: AC | PRN
  Administered 2014-06-03: 500 [IU]
  Filled 2014-06-03: qty 5

## 2014-06-03 MED ORDER — FAT EMULSION 20 % IV EMUL: 250.0000 mL | INTRAVENOUS | Status: DC

## 2014-06-03 MED ORDER — SODIUM CHLORIDE 0.9 % IV SOLN: 3000.0000 mL | INTRAVENOUS | Status: DC

## 2014-06-03 NOTE — Plan of Care (Signed)
Problem: Discharge Progression Outcomes Goal: Tolerating diet Outcome: Adequate for Discharge Receiving TPN

## 2014-06-03 NOTE — Progress Notes (Signed)
[facility]  Patient Status:   Matthew White is a new pt for [facility] this admission.  [name] is providing the following services: Home Health RN and Home Infusion Pharmacy for TNA.   If patient discharges after hours, please call [phone].   Larry Sierras 06/03/2014, 8:38 AM

## 2014-06-03 NOTE — Progress Notes (Signed)
Morphine 1 mg wasted in sharps bin witnessed by Minette Headland. Pt already dc'd and not on the pyxis list. Spoke with [facility] in Pharmacy.

## 2014-06-03 NOTE — Progress Notes (Signed)
Speech Language Pathology Treatment: Dysphagia  Patient Details Name: Matthew White MRN: [id number] DOB: 1934/10/26 Today's Date: 06/03/2014 Time:  -     Assessment / Plan / Recommendation Clinical Impression  Pt reports good tolerance of po diet over the weekend using compensation strategies reinforced via verbal/visual feedback provided during MBS on Saturday 06/01/14.    Given pt radiated on left collar region and at T1, suspect he may develop possible radiation effects in laryngopharyngeal region. SLP provided pt with exercises to maximize swallow function and decrease tissue fibrosis - demonstrating and providing in writing.  Advised him to conduct these every other day for at least 12 -16 weeks to see improvement and continue a few times a week beyond.    SLP to sign off as all education has been completed. Thanks for allowing me the pleasure of helping to care for this pt.     HPI HPI: 80 y.o. male with hx of adenocarcinoma of the distal esophagus and esophagogastrectomy 04/29/13 admitted with left upper lip abcsess; drained and treated with abx.  Hx includes radiation on 01/24/2013 -completed a first cycle of weekly Taxol/carboplatin on 01/25/2013; chemotherapy completed 02/22/2013, radiation completed 02/27/2013; odynophagia/early satiety, postoperative hoarseness-status post collagen injection therapy at [place], significant weight loss which stabilized with J-tube - this has since been removed; pt on TPN and advanced 6/19  to full liquids.  Per RD notes, has preferred purees/liquids for ease of eating.  Pt was scheduled for outpt MBS on 6/22 but MD ordered eval while in house.     Pertinent Vitals Afebrile,decreased  SLP Plan  Continue with current plan of care    Recommendations Diet recommendations: Thin liquid;Dysphagia 3 (mechanical soft) Liquids provided via: Cup Medication Administration: Crushed with puree Supervision: Patient able to self feed Postural Changes and/or Swallow  Maneuvers: Chin tuck;Head turn left during swallow (hock up and expectorate residuals that you may not sense)              Oral Care Recommendations: Oral care BID Plan: Continue with current plan of care    [place], [place], [facility] SLP [phone]

## 2014-06-03 NOTE — Plan of Care (Signed)
Problem: Discharge Progression Outcomes Goal: Discharge plan in place and appropriate Outcome: Completed/Met Date Met:  06/03/14 Discharged to wife, going home. [facility] will be working with pt at home.

## 2014-06-03 NOTE — Discharge Summary (Addendum)
Physician Discharge Summary  Patient ID: Matthew White MRN: [id number] DOB/AGE: May 25, 1934 80 y.o.  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 He was started on antibiotics and was seen by consultants from medical oncology, radiation oncology, and otolaryngology.  He had a CT scan of the chest done which showed multiple bony metastases with a approximate 4 cm metastasis in the T2 vertebral body with collapse of the posterior wall and impression upon the anterior spinal cord.  There are also scattered rib and spine metastases and multiple pulmonary metastases.  He also had a modified barium swallow study after which it was recommended that he be on a mechanical soft diet with thin liquids with precautions to prevent aspiration.  On June 18 he had a transoral incision and drainage procedure done for a left upper lip abscess with a large amount of purulence drained and cultures showing MRSA. He was treated with IV vancomycin for this.  In addition he was noted to have severe protein calorie malnutrition because of long-standing dysphagia and was started on TNA for this.  He did well with improvement in his overall condition but still was not consuming enough food by mouth.   At the time of discharge he was eating a small amount of mechanical soft food, and he is not having significant cough, shortness of breath, abdominal  pain, or diarrhea.  He completed palliative radiation therapy for his metastatic disease in the hospital.  He'll be discharged on TNA and IV vancomycin with home health nursing and physical therapy and speech pathology. Procedures during his hospitalization included a CT scan of the chest with IV contrast, a modified barium swallow study, and a transoral incision and drainage procedure.  There were no complications from his hospitalization.  Consults: ENT, radiation oncology, medical oncology  Significant Diagnostic Studies:  No results found.  Labs: Lab Results  Component Value Date   WBC 9.0 06/03/2014   HGB 10.7* 06/03/2014   HCT 31.6* 06/03/2014   MCV 98.4 06/03/2014   PLT 271 06/03/2014      Recent Labs Lab 06/03/14 0338  NA 137  K 4.9  CL 100  CO2 25  BUN 49*  CREATININE 0.73  CALCIUM 9.6  PROT 5.9*  BILITOT 0.3  ALKPHOS 82  ALT 8  AST 12  GLUCOSE 114*       Lab Results  Component Value Date   INR 1.01 05/30/2014   INR 1.04 05/03/2014   INR 1.00 03/07/2014     Recent Results (from the past 240 hour(s))  MRSA PCR SCREENING     Status: Abnormal   Collection Time    05/30/14  2:56 PM      Result Value Ref Range Status   MRSA by PCR POSITIVE (*) NEGATIVE Final   Comment:            The GeneXpert MRSA Assay (FDA     approved for NASAL specimens  only), is one component of a     comprehensive MRSA colonization     surveillance program. It is not     intended to diagnose MRSA     infection nor to guide or     monitor treatment for     MRSA infections.     RESULT CALLED TO, READ BACK BY AND VERIFIED WITH:     Trude Mcburney RN [id number] 05/30/14 A [facility]ARRO  WOUND CULTURE     Status: None   Collection Time    05/30/14  9:53 PM      Result Value Ref Range Status   Specimen Description ABSCESS UPPER LIP   Final   Special Requests NONE   Final   Gram Stain     Final   Value: ABUNDANT WBC PRESENT,BOTH PMN AND MONONUCLEAR     NO SQUAMOUS EPITHELIAL CELLS SEEN     FEW GRAM  POSITIVE COCCI IN CLUSTERS     Performed at [facility]   Culture     Final   Value: MODERATE METHICILLIN RESISTANT STAPHYLOCOCCUS AUREUS     Note: RIFAMPIN AND GENTAMICIN SHOULD NOT BE USED AS SINGLE DRUGS FOR TREATMENT OF STAPH INFECTIONS. This organism DOES NOT demonstrate inducible Clindamycin resistance in vitro. CRITICAL RESULT CALLED TO, READ BACK BY AND VERIFIED WITH: KIM O.6/21 @1115       BY REAMM     Performed at [facility]   Report Status 06/02/2014 FINAL   Final   Organism ID, Bacteria METHICILLIN RESISTANT STAPHYLOCOCCUS AUREUS   Final      Discharge Exam: Blood pressure 112/63, pulse 83, temperature 99.3 F (37.4 C), temperature source Oral, resp. rate 16, height 6\' 1"  (1.854 m), weight 65.1 kg (143 lb 8.3 oz), SpO2 100.00%.  Physical Exam: In general, the patient is a thin elderly white man who was in no apparent distress while sitting partially upright in bed.  HEENT exam was significant for bitemporal wasting, neck was supple without jugular venous distention or carotid bruit, chest was clear to auscultation, heart had a regular rate and rhythm, abdomen had normal bowel sounds and no tenderness, extremities were without cyanosis, clubbing, or edema.  He had 4/5 right hip flexor strength, and otherwise 5 out of 5 strength in all extremities. He was alert and well oriented with a normal affect.  Disposition:he'll be discharged to home today and home health nursing will be following him for IV antibiotic treatment as well as TNA treatment, speech pathologist work to continue treatment of moderate pharyngeal phase dysphagia, and physical therapy for her gait instability and physical deconditioning.  He should call our office at [phone] to schedule a follow-up visit with Dr. Shon Baton in 7-10 days following discharge from the hospital.      Discharge Instructions   Ambulatory referral to [place]    Complete by:  As directed   Please evaluate Matthew White for admission to [facility].  Disciplines requested: Nursing and Physical Therapy  Services to provide: Double Lumen Central Venous Access, Hyperalimentation (TPN)  and IV Antibiotics  Physician to follow patient's care (the person listed here will be responsible for signing ongoing orders): PCP  Requested Start of Care Date: Today (Please call ahead to confirm availability)  I certify that this patient is under my care and that I, or a Nurse Practitioner or Physician's Assistant working with me, had a face-to-face encounter that meets the physician face-to-face requirements with patient on 06/03/14. The encounter with  the patient was in whole, or in part for the following medical condition(s) which is the primary reason for home health care (List medical condition). Has metastatic cancer with severe weight loss, pathologic fracture  Special Instructions:  Needs to continue TPN today I Herbie Lehrmann G certify that this patient is under my care and that I, or a nurse practitioner or physician's assistant working with me, had a face-to-face encounter that meets the physician face-to-face encounter requirements with this patient on 06/03/2014. The encounter with the patient was in whole, or in part for the following medical condition(s) which is the primary reason for home health care (List medical condition): metastatic cancer, severe weight loss        Please evaluate ROD MAJERUS for admission to [facility]  Disciplines requested: Nursing and Physical Therapy  Services to provide: Hyperalimentation (TPN)  and IV Antibiotics  Physician to follow patient's care (the person listed here will be responsible for signing ongoing orders): PCP  Requested Start of Care Date: Today (Please call ahead to confirm availability)  I certify that this patient is under my care and that I, or a Nurse Practitioner or Physician's Assistant working with me, had a face-to-face encounter that  meets the physician face-to-face requirements with patient on 06/03/14. The encounter with the patient was in whole, or in part for the following medical condition(s) which is the primary reason for home health care (List medical condition). Metastatic cancer with extreme weight loss, pathologic femur fracture, gait instability, protein calorie malnutrition  Special Instructions:  none  The encounter with the patient was in whole, or in part, for the following medical condition, which is the primary reason for home health care:  metastatic cancer with weight loss  I certify that, based on my findings, the following services are medically necessary home health services:   Physical therapy Speech language pathology    My clinical findings support the need for the above services:  Unable to leave home safely without assistance and/or assistive device  Further, I certify that my clinical findings support that this patient is homebound due to:  Unsafe ambulation due to balance issues  Reason for Medically Necessary Home Health Services:  Skilled Nursing- Assessment and Training for Infusion Therapy, Line Care, and Infection Control  Does the patient have Medicare or Medicaid?:  Yes     Call MD for:    Complete by:  As directed   Call for fever, chills, severe diarrhea, or any other concerning symptoms     Care order/instruction    Complete by:  As directed   Rigid computer will not allow me to prescribe home TPN therapy, please do what is needed to make this happen     Diet - low sodium heart healthy    Complete by:  As directed      Discharge instructions    Complete by:  As directed   Flush IV every 12 hours with saline.     Increase activity slowly    Complete by:  As directed             Medication List         dexamethasone 4 MG tablet  Commonly known as:  DECADRON  Take 4 mg by mouth 2 (two) times daily with a meal.     docusate sodium 100 MG capsule  Commonly known as:  COLACE   Take 100 mg by mouth 2 (two) times daily as needed for mild constipation.  doxycycline 100 MG tablet  Commonly known as:  VIBRA-TABS  Take 1 tablet (100 mg total) by mouth every 12 (twelve) hours.     fat emulsion 20 % infusion  Inject 250 mLs into the vein continuous.     FISH OIL PO  Take 1 capsule by mouth 2 (two) times daily.     ibuprofen 200 MG tablet  Commonly known as:  ADVIL,MOTRIN  Take 200 mg by mouth 2 (two) times daily as needed for mild pain.     insulin glargine 100 UNIT/ML injection  Commonly known as:  LANTUS  Inject 10 Units into the skin at bedtime.     insulin lispro 100 UNIT/ML injection  Commonly known as:  HUMALOG  Inject 0-8 Units into the skin every evening. Patient uses a sliding scale from 0-8 units     lactose free nutrition Liqd  Take 237 mLs by mouth 3 (three) times daily between meals.     ENSURE  Take 237 mLs by mouth 4 (four) times daily as needed (for supplement.  Will use Boost or Ensure.).     lidocaine-prilocaine cream  Commonly known as:  EMLA  Apply 1 application topically as needed (for port).     loperamide 2 MG tablet  Commonly known as:  IMODIUM A-D  Take 2 mg by mouth 4 (four) times daily as needed for diarrhea or loose stools.     mupirocin ointment 2 %  Commonly known as:  BACTROBAN  Place 1 application into the nose 2 (two) times daily.     omeprazole 20 MG capsule  Commonly known as:  PRILOSEC  Take 20 mg by mouth daily as needed (for acid reflux).     OxyCODONE 20 mg T12a 12 hr tablet  Commonly known as:  OXYCONTIN  Take 1 tablet (20 mg total) by mouth every 12 (twelve) hours.     oxyCODONE-acetaminophen 5-325 MG per tablet  Commonly known as:  PERCOCET/ROXICET  Take 2 tablets by mouth every 4 (four) hours as needed for severe pain.     polyethylene glycol packet  Commonly known as:  MIRALAX / GLYCOLAX  Take 17 g by mouth daily as needed for mild constipation.     prochlorperazine 5 MG tablet  Commonly  known as:  COMPAZINE  Take 1 tablet (5 mg total) by mouth every 6 (six) hours as needed for nausea or vomiting.     sodium chloride 0.9 % infusion  Inject 3,000 mLs into the vein continuous.     Vancomycin 750 MG/150ML Soln  Commonly known as:  VANCOCIN  Inject 150 mLs (750 mg total) into the vein every 12 (twelve) hours.       Follow-up Information   Follow up with Precious Reel, MD. Schedule an appointment as soon as possible for a visit in 1 week.   Specialty:  Internal Medicine   Contact information:   [place] [address] [phone]       Signed: Donnajean Lopes 06/03/2014, 6:32 PM

## 2014-06-03 NOTE — Progress Notes (Signed)
Discharge instructions explained to pt and wife, scripts called to their pharmacy. DC'D via wheelchair

## 2014-06-04 ENCOUNTER — Ambulatory Visit: Payer: Medicare PPO

## 2014-06-04 ENCOUNTER — Ambulatory Visit
Admission: RE | Admit: 2014-06-04 | Discharge: 2014-06-04 | Disposition: A | Discharge status: Home / Self Care | Payer: Medicare PPO | Source: Ambulatory Visit | Attending: Radiation Oncology | Admitting: Radiation Oncology

## 2014-06-05 ENCOUNTER — Telehealth: Payer: Self-pay | Admitting: Oncology

## 2014-06-05 ENCOUNTER — Ambulatory Visit
Admission: RE | Admit: 2014-06-05 | Discharge: 2014-06-05 | Disposition: A | Discharge status: Home / Self Care | Payer: Medicare PPO | Source: Ambulatory Visit | Attending: Radiation Oncology | Admitting: Radiation Oncology

## 2014-06-05 ENCOUNTER — Telehealth: Payer: Self-pay | Admitting: *Deleted

## 2014-06-05 ENCOUNTER — Encounter: Payer: Self-pay | Admitting: *Deleted

## 2014-06-05 ENCOUNTER — Ambulatory Visit (HOSPITAL_BASED_OUTPATIENT_CLINIC_OR_DEPARTMENT_OTHER): Payer: Medicare PPO | Admitting: Oncology

## 2014-06-05 ENCOUNTER — Ambulatory Visit: Payer: Medicare PPO

## 2014-06-05 VITALS — BP 88/67 | HR 110 | Temp 96.9°F | Resp 18 | Ht 73.0 in | Wt 146.2 lb

## 2014-06-05 DIAGNOSIS — C155 Malignant neoplasm of lower third of esophagus: Secondary | ICD-10-CM

## 2014-06-05 DIAGNOSIS — K5900 Constipation, unspecified: Secondary | ICD-10-CM

## 2014-06-05 DIAGNOSIS — C7951 Secondary malignant neoplasm of bone: Secondary | ICD-10-CM

## 2014-06-05 DIAGNOSIS — C159 Malignant neoplasm of esophagus, unspecified: Secondary | ICD-10-CM

## 2014-06-05 DIAGNOSIS — C7952 Secondary malignant neoplasm of bone marrow: Secondary | ICD-10-CM

## 2014-06-05 DIAGNOSIS — C771 Secondary and unspecified malignant neoplasm of intrathoracic lymph nodes: Secondary | ICD-10-CM

## 2014-06-05 NOTE — Telephone Encounter (Signed)
Talked to pt's wife gave her appt for 7/8,lab, md, chemo and injection on 7/9

## 2014-06-05 NOTE — Progress Notes (Signed)
Patient's port-a-cath was dislodged when mister Borner accidently pulled the attached tubing in which his enteral nutrition was infusing.  The angiocath was half way out of the port.  Lannette Donath, RN de-acessed, cleansed the site and inserted a new angiocath, biopatch applied then area covered with hypafix tape.  Port site without any irritation.  Spouse in attendance.

## 2014-06-05 NOTE — Progress Notes (Signed)
[facility] OFFICE PROGRESS NOTE   Diagnosis:  Esophagus cancer  INTERVAL HISTORY:   Mr. Vanover was admitted 05/30/2014 with a facial abscess. He underwent drainage of the abscess and is completing outpatient antibiotics.cultures from the abscess grew MRSA.  He reports feeling much better compared to hospital admission. He continues to have dysphagia, but this improves with maneuvers recommended by speech pathology.  He was discharged to home on TNA.He drinks approximately 4 nutrition supplements per day.  Mr. Griffing denies back pain. He complains of pain in the right thigh since undergoing the femur stabilization procedure. He continues OxyContin and takes Percocet 2-3 times per day.  Mr. Pote is scheduled to complete palliative radiation over the next few days.   Objective:  Vital signs in last 24 hours:  Blood pressure 88/67, pulse 110, temperature 96.9 F (36.1 C), temperature source Oral, resp. rate 18, height _0  (1.854 m), weight 146 lb 3.2 oz (66.316 kg), SpO2 99.00%.    HEENT: healing ulceration at the upper inner lip, neck without mass, no apparent abscess over the face Resp: lungs clear bilaterally Cardio: regular rate and rhythm GI: no hepatomegaly Vascular: no leg edema Musculoskeletal: Examination of the right thigh is unremarkable    Portacath/PICC-without erythema  Lab Results:  Lab Results  Component Value Date   WBC 9.0 06/03/2014   HGB 10.7* 06/03/2014   HCT 31.6* 06/03/2014   MCV 98.4 06/03/2014   PLT 271 06/03/2014   NEUTROABS 7.2 06/03/2014    Medications: I have reviewed the patient's current medications.  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  -Status post an esophagogastrectomy 04/20/2013 with the pathology  confirming a pT1b,pN1 tumor, grade 1 with negative surgical margins.  2. History of Barrett's esophagus.  3. Adult onset diabetes.  4. Odynophagia/early satiety. The odynophagia is better.  5. Postoperative hoarseness-status post collagen injection therapy at [facility].  6. Dysphagia progressive over the past 2-4 months. He is sheduled for a dilatation procedure in the near future.  7. Chest CT 02/14/2014 showed a new masslike partially calcified lesion in the right lower lobe peripherally. There were some new lower lobe reticulonodular opacities. A peripherally located right lower lobe superior segment nodule was larger.  PET scan 02/19/2014 with evidence of multiple hypermetabolic lung nodules, hypermetabolic bone lesions, and a hypermetabolic focus at the left side of the prostate  CT biopsy of a right lower lobe subpleural nodule 03/07/2014 confirmed metastatic adenocarcinoma consistent with esophagus cancer. HER-2 negative.  Cycle 1 CAPOX beginning 03/18/2014.  Cycle 2 CAPOX beginning 04/08/2014.  Cycle 3 CAPOX beginning 04/29/2014.  Restaging chest CT 05/17/2014 with progression of multiple bone metastases including a nearly 4 cm lesion in the T2 vertebral body essentially destroying the vertebral body and penetrating through the posterior cortex into the anterior spinal canal. Another large lesion seen in the left clavicular head. Other scattered smaller rib and spinal lesions identified. Multiple masslike opacities scattered in the lungs. Some lesions progressing in the interval and others decreasing. 8. Port-A-Cath placement 03/11/2014  9. Severe pain at the left clavicle after a fall, plain x-ray of the left clavicle 03/18/2014 confirmed a lytic lesion of the medial left clavicle, PET scan at [facility] 02/19/2014 revealed a hypermetabolic medial left clavicle lesion.  10. Constipation-likely secondary to narcotics.  11. Delayed nausea following cycle 2 CAPOX. Aloxi  added beginning with cycle 3.  12.  Impending pathologic fracture right femur status post rod placement 05/03/2014.  13. facial MRSA abscess status post incision and drainage procedure 05/30/2014 14. Malnutrition-now maintained on TNA with weight gain   Disposition:  Mr. Fitzgibbon has progressive metastatic esophagus cancer. He is completing palliative radiation to T2, the left clavicle, and right femur.  I discussed systemic treatment options with Mr. Poland and his wife. We reviewed comfort care versus a trial of salvage chemotherapy. He wishes to proceed with chemotherapy. I recommend docetaxel. We reviewed the potential toxicities associated with this agent including a chance for mucositis, diarrhea, alopecia, and hematologic toxicity. We also discussed the neuropathy, allergic reaction, and fluid retention seen with docetaxel. He agrees to proceed.we also discussed Neulasta and the potential toxicities associated with this agent.  He will continue followup with Dr. Virgina Jock to discuss the indication for continuing TNA. He will discuss cycling to nighttime TNA. He is interested in placement of a jejunostomy feeding tube.I explained it would be difficult for him to undergo a surgical procedure his current condition.  Mr. Strout will return problems visit in 2 weeks. We will decide on docetaxel to be given weekly versus every 3 weeks pending his clinical status when he returns 06/19/2014.  He will begin Remeron as an appetite stimulant/antidepressant.  Betsy Coder, MD  06/05/2014  10:51 AM

## 2014-06-05 NOTE — Telephone Encounter (Signed)
, °

## 2014-06-05 NOTE — Telephone Encounter (Signed)
Per staff message and POF I have scheduled appts. Advised scheduler of appts. JMW  

## 2014-06-06 ENCOUNTER — Other Ambulatory Visit: Payer: Self-pay | Admitting: *Deleted

## 2014-06-06 ENCOUNTER — Ambulatory Visit: Payer: Medicare PPO

## 2014-06-06 ENCOUNTER — Ambulatory Visit
Admission: RE | Admit: 2014-06-06 | Discharge: 2014-06-06 | Disposition: A | Discharge status: Home / Self Care | Payer: Medicare PPO | Source: Ambulatory Visit | Attending: Radiation Oncology | Admitting: Radiation Oncology

## 2014-06-06 ENCOUNTER — Encounter: Payer: Self-pay | Admitting: Radiation Oncology

## 2014-06-06 ENCOUNTER — Encounter: Payer: Self-pay | Admitting: *Deleted

## 2014-06-06 VITALS — BP 88/49 | HR 96 | Temp 98.0°F | Resp 20 | Wt 145.5 lb

## 2014-06-06 DIAGNOSIS — C159 Malignant neoplasm of esophagus, unspecified: Secondary | ICD-10-CM

## 2014-06-06 MED ORDER — DEXAMETHASONE 4 MG PO TABS: 2.0000 mg | ORAL_TABLET | Freq: Two times a day (BID) | ORAL | Status: DC

## 2014-06-06 MED ORDER — MIRTAZAPINE 15 MG PO TABS: 15.0000 mg | ORAL_TABLET | Freq: Every day | ORAL | Status: DC

## 2014-06-06 NOTE — Progress Notes (Addendum)
  Radiation Oncology         [phone]) [phone] ________________________________  Name: Matthew White MRN: [id number]  Date: 06/06/2014  DOB: 17-Jul-1934  Weekly Radiation Therapy Management  Current Dose: 28 Gy     Planned Dose:  28 Gy  Narrative . . . . . . . . The patient presents for routine under treatment assessment.                                   The patient is without complaint.                                 Set-up films were reviewed.                                 The chart was checked. Physical Findings. . .  weight is 145 lb 8 oz (65.998 kg). His oral temperature is 98 F (36.7 C). His blood pressure is 88/49 and his pulse is 96. His respiration is 20. . Weight essentially stable.  No significant changes. Impression . . . . . . . The patient is tolerating radiation. Plan . . . . . . . . . . . . Complete treatment as planned.  ________________________________  Sheral Apley. Tammi Klippel, M.D.

## 2014-06-06 NOTE — Progress Notes (Signed)
Weekly rad tx right femur completed, just d/c hospital 06/03/14 placed on doxycycline and vancomycin, tight hip pain 3/10 scale when sitting, when standing 7/10 pain, has oxycodone pain medication, on decadron 4mg  daily to be weaned off,  Took ortho vitals, sitting b/p=94/54,P=83, rr=20,T=98.0 standiong b/p=88/49, P=96, patient stated that was pretty good for hi,", drinks boost daily 3-4x, and is on TPN via right porta cath 24/7 hours for now, intact, dry dressing, no redness at sight, appetite getting better had eggs, nbacon and boost this am  Does choke at times not as much, drinks passion fruit juice , water  Boost without difficulty,, no thush seen on tongue, energy getting some better too, has 1 month f/u appt alrady 1:32 PM

## 2014-06-07 ENCOUNTER — Ambulatory Visit: Payer: Medicare PPO

## 2014-06-15 ENCOUNTER — Other Ambulatory Visit: Payer: Self-pay | Admitting: Oncology

## 2014-06-15 DIAGNOSIS — C159 Malignant neoplasm of esophagus, unspecified: Secondary | ICD-10-CM

## 2014-06-17 NOTE — Progress Notes (Signed)
  Radiation Oncology         [phone] ________________________________  Name: Matthew White MRN: [id number]  Date: 06/06/2014  DOB: 12-Dec-1934  End of Treatment Note  Diagnosis:   Metastatic esophageal cancer with bony metastasis     Indication for treatment:  Palliative       Radiation treatment dates:   05/20/2014 through 06/06/2014  Site/dose:    1. T2 vertebral body/left medial clavicle:  This was treated to a dose of 30 gray in 10 fractions using a 4 field 3-D conformal technique. Daily image guidance was used during this treatment 2.  right femur:  The target area was treated to a dose of 28 gray in 8 fractions at 3.5 gray per fraction. AP PA fields were used.  Narrative: The patient tolerated radiation treatment relatively well.     Plan: The patient has completed radiation treatment. The patient will return to radiation oncology clinic for routine followup in one month. I advised the patient to call or return sooner if they have any questions or concerns related to their recovery or treatment. ________________________________  Jodelle Gross, M.D., Ph.D.

## 2014-06-17 NOTE — Progress Notes (Signed)
  Radiation Oncology         [phone]) [phone] ________________________________  Name: ISAK SOTOMAYOR MRN: [id number]  Date: 05/17/2014  DOB: [birth year]  SIMULATION AND TREATMENT PLANNING NOTE  DIAGNOSIS:  Metastatic esophageal cancer  Site:  T2/left medial clavicle  NARRATIVE:  The patient was brought to the [place] suite.  Identity was confirmed.  All relevant records and images related to the planned course of therapy were reviewed.   Written consent to proceed with treatment was confirmed which was freely given after reviewing the details related to the planned course of therapy had been reviewed with the patient.  Then, the patient was set-up in a stable reproducible  supine position for radiation therapy.  CT images were obtained.  Surface markings were placed.    Medically necessary complex treatment device(s) for immobilization:  Customized thermoplastic head cast.   The CT images were loaded into the planning software.  Then the target and avoidance structures were contoured.  Treatment planning then occurred.  The radiation prescription was entered and confirmed.  A total of 4 complex treatment devices were fabricated which relate to the designed radiation treatment fields. Each of these customized fields/ complex treatment devices will be used on a daily basis during the radiation course. I have requested : 3D Simulation  I have requested a DVH of the following structures: Target volume, lungs, spinal cord.   PLAN:  The patient will receive 30 Gy in 10 fractions.  ________________________________   Jodelle Gross, MD, PhD

## 2014-06-17 NOTE — Addendum Note (Signed)
Encounter addended by: Marye Round, MD on: 06/17/2014  7:49 PM<BR>     Documentation filed: Notes Section, Visit Diagnoses

## 2014-06-19 ENCOUNTER — Telehealth: Payer: Self-pay | Admitting: Oncology

## 2014-06-19 ENCOUNTER — Telehealth: Payer: Self-pay | Admitting: *Deleted

## 2014-06-19 ENCOUNTER — Ambulatory Visit (HOSPITAL_BASED_OUTPATIENT_CLINIC_OR_DEPARTMENT_OTHER): Payer: Medicare PPO | Admitting: Nurse Practitioner

## 2014-06-19 ENCOUNTER — Other Ambulatory Visit: Payer: Self-pay | Admitting: Oncology

## 2014-06-19 ENCOUNTER — Other Ambulatory Visit (HOSPITAL_BASED_OUTPATIENT_CLINIC_OR_DEPARTMENT_OTHER): Payer: Medicare PPO

## 2014-06-19 ENCOUNTER — Ambulatory Visit (HOSPITAL_BASED_OUTPATIENT_CLINIC_OR_DEPARTMENT_OTHER): Payer: Medicare PPO

## 2014-06-19 VITALS — BP 75/52 | HR 111 | Temp 98.2°F | Resp 20 | Ht 73.0 in | Wt 153.5 lb

## 2014-06-19 VITALS — BP 101/86 | HR 79 | Temp 98.2°F | Resp 18

## 2014-06-19 DIAGNOSIS — Z5111 Encounter for antineoplastic chemotherapy: Secondary | ICD-10-CM

## 2014-06-19 DIAGNOSIS — M899 Disorder of bone, unspecified: Secondary | ICD-10-CM

## 2014-06-19 DIAGNOSIS — M84551D Pathological fracture in neoplastic disease, right femur, subsequent encounter for fracture with routine healing: Principal | ICD-10-CM

## 2014-06-19 DIAGNOSIS — M949 Disorder of cartilage, unspecified: Secondary | ICD-10-CM

## 2014-06-19 DIAGNOSIS — C155 Malignant neoplasm of lower third of esophagus: Secondary | ICD-10-CM

## 2014-06-19 DIAGNOSIS — C782 Secondary malignant neoplasm of pleura: Secondary | ICD-10-CM

## 2014-06-19 DIAGNOSIS — C159 Malignant neoplasm of esophagus, unspecified: Secondary | ICD-10-CM

## 2014-06-19 DIAGNOSIS — D489 Neoplasm of uncertain behavior, unspecified: Secondary | ICD-10-CM

## 2014-06-19 LAB — COMPREHENSIVE METABOLIC PANEL (CC13)
ALBUMIN: 2.7 g/dL — AB (ref 3.5–5.0)
ALK PHOS: 70 U/L (ref 40–150)
ALT: 14 U/L (ref 0–55)
AST: 17 U/L (ref 5–34)
Anion Gap: 6 mEq/L (ref 3–11)
BUN: 65.6 mg/dL — ABNORMAL HIGH (ref 7.0–26.0)
CO2: 24 mEq/L (ref 22–29)
Calcium: 9.5 mg/dL (ref 8.4–10.4)
Chloride: 114 mEq/L — ABNORMAL HIGH (ref 98–109)
Creatinine: 0.8 mg/dL (ref 0.7–1.3)
GLUCOSE: 94 mg/dL (ref 70–140)
POTASSIUM: 4.8 meq/L (ref 3.5–5.1)
SODIUM: 143 meq/L (ref 136–145)
Total Bilirubin: 0.31 mg/dL (ref 0.20–1.20)
Total Protein: 5.9 g/dL — ABNORMAL LOW (ref 6.4–8.3)

## 2014-06-19 LAB — CBC WITH DIFFERENTIAL/PLATELET
BASO%: 0.5 % (ref 0.0–2.0)
Basophils Absolute: 0 10*3/uL (ref 0.0–0.1)
EOS ABS: 0.1 10*3/uL (ref 0.0–0.5)
EOS%: 1.8 % (ref 0.0–7.0)
HCT: 33.2 % — ABNORMAL LOW (ref 38.4–49.9)
HGB: 10.9 g/dL — ABNORMAL LOW (ref 13.0–17.1)
LYMPH%: 13.4 % — ABNORMAL LOW (ref 14.0–49.0)
MCH: 34.9 pg — ABNORMAL HIGH (ref 27.2–33.4)
MCHC: 32.7 g/dL (ref 32.0–36.0)
MCV: 106.6 fL — AB (ref 79.3–98.0)
MONO#: 0.8 10*3/uL (ref 0.1–0.9)
MONO%: 12.8 % (ref 0.0–14.0)
NEUT%: 71.5 % (ref 39.0–75.0)
NEUTROS ABS: 4.6 10*3/uL (ref 1.5–6.5)
Platelets: 190 10*3/uL (ref 140–400)
RBC: 3.12 10*6/uL — AB (ref 4.20–5.82)
RDW: 19.1 % — AB (ref 11.0–14.6)
WBC: 6.4 10*3/uL (ref 4.0–10.3)
lymph#: 0.9 10*3/uL (ref 0.9–3.3)

## 2014-06-19 MED ORDER — ONDANSETRON 8 MG/NS 50 ML IVPB
INTRAVENOUS | Status: AC
  Filled 2014-06-19: qty 8

## 2014-06-19 MED ORDER — OXYCODONE HCL ER 20 MG PO T12A: 20.0000 mg | EXTENDED_RELEASE_TABLET | Freq: Two times a day (BID) | ORAL | Status: DC

## 2014-06-19 MED ORDER — DEXAMETHASONE SODIUM PHOSPHATE 10 MG/ML IJ SOLN
10.0000 mg | Freq: Once | INTRAMUSCULAR | Status: AC
  Administered 2014-06-19: 10 mg via INTRAVENOUS

## 2014-06-19 MED ORDER — SODIUM CHLORIDE 0.9 % IJ SOLN
10.0000 mL | INTRAMUSCULAR | Status: DC | PRN
  Administered 2014-06-19: 10 mL
  Filled 2014-06-19: qty 10

## 2014-06-19 MED ORDER — DOCETAXEL CHEMO INJECTION 160 MG/16ML
20.0000 mg/m2 | Freq: Once | INTRAVENOUS | Status: AC
  Administered 2014-06-19: 40 mg via INTRAVENOUS
  Filled 2014-06-19: qty 4

## 2014-06-19 MED ORDER — SODIUM CHLORIDE 0.9 % IV SOLN
Freq: Once | INTRAVENOUS | Status: AC
  Administered 2014-06-19: 14:00:00 via INTRAVENOUS

## 2014-06-19 MED ORDER — ONDANSETRON 8 MG/50ML IVPB (CHCC)
8.0000 mg | Freq: Once | INTRAVENOUS | Status: AC
  Administered 2014-06-19: 8 mg via INTRAVENOUS

## 2014-06-19 MED ORDER — DEXAMETHASONE SODIUM PHOSPHATE 10 MG/ML IJ SOLN
INTRAMUSCULAR | Status: AC
  Filled 2014-06-19: qty 1

## 2014-06-19 MED ORDER — HEPARIN SOD (PORK) LOCK FLUSH 100 UNIT/ML IV SOLN
500.0000 [IU] | Freq: Once | INTRAVENOUS | Status: AC | PRN
  Administered 2014-06-19: 500 [IU]
  Filled 2014-06-19: qty 5

## 2014-06-19 MED ORDER — ALPRAZOLAM 1 MG PO TABS: 1.0000 mg | ORAL_TABLET | Freq: Every evening | ORAL | Status: DC | PRN

## 2014-06-19 MED ORDER — OXYCODONE-ACETAMINOPHEN 5-325 MG PO TABS: 2.0000 | ORAL_TABLET | ORAL | Status: DC | PRN

## 2014-06-19 NOTE — Telephone Encounter (Signed)
Gave pt appt for lab,md and chemo for July 2015

## 2014-06-19 NOTE — Telephone Encounter (Signed)
Per staff message and POF I have scheduled appts. Advised scheduler of appts. JMW  

## 2014-06-19 NOTE — Progress Notes (Addendum)
[place] OFFICE PROGRESS NOTE   Diagnosis:  Esophagus cancer.  INTERVAL HISTORY:   Matthew White returns as scheduled. He continues to have pain at the right hip and leg which is worse with weightbearing. He is taking oxycontin with oxycodone as needed. He continues to have difficulty swallowing. He does okay if he eats very small amounts. No nausea or vomiting. No significant diarrhea. He continues TPN 18 hours per day. He drinks 4 nutritional supplements per day.  Objective:  Vital signs in last 24 hours:  Blood pressure 75/52, pulse 111, temperature 98.2 F (36.8 C), temperature source Oral, resp. rate 20, height 6' 1"  (1.854 m), weight 153 lb 8 oz (69.627 kg). repeat heart rate 96, blood pressure 86/58.    HEENT: Tongue appear somewhat dry. Small ecchymoses bilateral buccal regions. No ulcerations. Lymphatics: Small right low neck/scalene lymph node. Resp: Lungs clear. Cardio: Regular cardiac rhythm. GI: Abdomen soft and nontender. No hepatomegaly. Vascular: No leg edema.  Port-A-Cath site without erythema.   Lab Results:  Lab Results  Component Value Date   WBC 6.4 06/19/2014   HGB 10.9* 06/19/2014   HCT 33.2* 06/19/2014   MCV 106.6* 06/19/2014   PLT 190 06/19/2014   NEUTROABS 4.6 06/19/2014    Imaging:  No results found.  Medications: I have reviewed the patient's current medications.  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  -Status post an esophagogastrectomy 04/20/2013 with the pathology confirming a pT1b,pN1 tumor, grade 1 with negative surgical margins.  2. History of Barrett's esophagus.  3. Adult onset diabetes.  4. Odynophagia/early satiety. The odynophagia is better.  5. Postoperative hoarseness-status post collagen injection  therapy at [facility].  6. Dysphagia progressive over the past 2-4 months. He is sheduled for a dilatation procedure in the near future.  7. Chest CT 02/14/2014 showed a new masslike partially calcified lesion in the right lower lobe peripherally. There were some new lower lobe reticulonodular opacities. A peripherally located right lower lobe superior segment nodule was larger.  PET scan 02/19/2014 with evidence of multiple hypermetabolic lung nodules, hypermetabolic bone lesions, and a hypermetabolic focus at the left side of the prostate  CT biopsy of a right lower lobe subpleural nodule 03/07/2014 confirmed metastatic adenocarcinoma consistent with esophagus cancer. HER-2 negative.  Cycle 1 CAPOX beginning 03/18/2014.  Cycle 2 CAPOX beginning 04/08/2014.  Cycle 3 CAPOX beginning 04/29/2014.  Restaging chest CT 05/17/2014 with progression of multiple bone metastases including a nearly 4 cm lesion in the T2 vertebral body essentially destroying the vertebral body and penetrating through the posterior cortex into the anterior spinal canal. Another large lesion seen in the left clavicular head. Other scattered smaller rib and spinal lesions identified. Multiple masslike opacities scattered in the lungs. Some lesions progressing in the interval and others decreasing. He completed palliative radiation to the left clavicle, T2, and right femur 8. Port-A-Cath placement 03/11/2014  9. Severe pain at the left clavicle after a fall, plain x-ray of the left clavicle 03/18/2014 confirmed a lytic lesion of the medial left clavicle, PET scan at [facility] 02/19/2014 revealed a hypermetabolic medial left clavicle lesion.  10. Constipation-likely secondary to narcotics.  11. Delayed nausea following cycle 2 CAPOX. Aloxi added beginning with cycle 3.  12. Impending pathologic fracture right femur status post rod placement 05/03/2014.  13. facial MRSA abscess status post  incision and drainage procedure 05/30/2014  14.  Malnutrition-now maintained on TNA with weight gain. 15. Elevated BUN. Question related to TPN.      Disposition: Matthew White clinical status appears unchanged. He would like to proceed with a trial of docetaxel. We again reviewed potential toxicities. Dr. Benay Spice recommends the docetaxel be given weekly x3 followed by a one-week break rather than on the every 3 week schedule. Matthew White is in agreement. Plan to proceed with cycle 1 week 1 today as scheduled. We will see him in followup prior to the week 2 treatment.   The BUN is elevated on labs today. Clinically he does not appear dehydrated. Question related to TPN. We will repeat in one week.   He will contact the office prior to his next visit with any problems. He was given new prescriptions for OxyContin, oxycodone and Xanax at his request.  Patient seen with Dr. Benay Spice. 25 minutes were spent face-to-face at today's visit with the majority of that time involved in counseling/coordination of care.  Ned Card ANP/GNP-BC   06/19/2014  2:12 PM  This was a shared visit with Ned Card. Matthew White would like to proceed with a trial of salvage chemotherapy. The plan is to begin weekly Taxotere. We again reviewed the potential toxicities associated with Taxotere. The BUN is significantly elevated today, but he does not appear dehydrated and reports approximately 3 L of fluid intake per day.  Julieanne Manson, M.D.

## 2014-06-19 NOTE — Patient Instructions (Addendum)
[facility] Discharge Instructions for Patients Receiving Chemotherapy  Today you received the following chemotherapy agents Taxotere.  To help prevent nausea and vomiting after your treatment, we encourage you to take your nausea medication.   If you develop nausea and vomiting that is not controlled by your nausea medication, call the clinic.   BELOW ARE SYMPTOMS THAT SHOULD BE REPORTED IMMEDIATELY:  *FEVER GREATER THAN 100.5 F  *CHILLS WITH OR WITHOUT FEVER  NAUSEA AND VOMITING THAT IS NOT CONTROLLED WITH YOUR NAUSEA MEDICATION  *UNUSUAL SHORTNESS OF BREATH  *UNUSUAL BRUISING OR BLEEDING  TENDERNESS IN MOUTH AND THROAT WITH OR WITHOUT PRESENCE OF ULCERS  *URINARY PROBLEMS  *BOWEL PROBLEMS  UNUSUAL RASH Items with * indicate a potential emergency and should be followed up as soon as possible.  Feel free to call the clinic you have any questions or concerns. The clinic phone number is [phone].   Docetaxel injection (Taxotere) What is this medicine? DOCETAXEL (doe se TAX el) is a chemotherapy drug. It targets fast dividing cells, like cancer cells, and causes these cells to die. This medicine is used to treat many types of cancers like breast cancer, certain stomach cancers, head and neck cancer, lung cancer, and prostate cancer. This medicine may be used for other purposes; ask your health care provider or pharmacist if you have questions. COMMON BRAND NAME(S): Docefrez, Taxotere What should I tell my health care provider before I take this medicine? They need to know if you have any of these conditions: -infection (especially a virus infection such as chickenpox, cold sores, or herpes) -liver disease -low blood counts, like low white cell, platelet, or red cell counts -an unusual or allergic reaction to docetaxel, polysorbate 80, other chemotherapy agents, other medicines, foods, dyes, or preservatives -pregnant or trying to get  pregnant -breast-feeding How should I use this medicine? This drug is given as an infusion into a vein. It is administered in a hospital or clinic by a specially trained health care professional. Talk to your pediatrician regarding the use of this medicine in children. Special care may be needed. Overdosage: If you think you have taken too much of this medicine contact a poison control center or emergency room at once. NOTE: This medicine is only for you. Do not share this medicine with others. What if I miss a dose? It is important not to miss your dose. Call your doctor or health care professional if you are unable to keep an appointment. What may interact with this medicine? -cyclosporine -erythromycin -ketoconazole -medicines to increase blood counts like filgrastim, pegfilgrastim, sargramostim -vaccines Talk to your doctor or health care professional before taking any of these medicines: -acetaminophen -aspirin -ibuprofen -ketoprofen -naproxen This list may not describe all possible interactions. Give your health care provider a list of all the medicines, herbs, non-prescription drugs, or dietary supplements you use. Also tell them if you smoke, drink alcohol, or use illegal drugs. Some items may interact with your medicine. What should I watch for while using this medicine? Your condition will be monitored carefully while you are receiving this medicine. You will need important blood work done while you are taking this medicine. This drug may make you feel generally unwell. This is not uncommon, as chemotherapy can affect healthy cells as well as cancer cells. Report any side effects. Continue your course of treatment even though you feel ill unless your doctor tells you to stop. In some cases, you may be given additional medicines to help  with side effects. Follow all directions for their use. Call your doctor or health care professional for advice if you get a fever, chills or sore  throat, or other symptoms of a cold or flu. Do not treat yourself. This drug decreases your body's ability to fight infections. Try to avoid being around people who are sick. This medicine may increase your risk to bruise or bleed. Call your doctor or health care professional if you notice any unusual bleeding. Be careful brushing and flossing your teeth or using a toothpick because you may get an infection or bleed more easily. If you have any dental work done, tell your dentist you are receiving this medicine. Avoid taking products that contain aspirin, acetaminophen, ibuprofen, naproxen, or ketoprofen unless instructed by your doctor. These medicines may hide a fever. This medicine contains an alcohol in the product. You may get drowsy or dizzy. Do not drive, use machinery, or do anything that needs mental alertness until you know how this medicine affects you. Do not stand or sit up quickly, especially if you are an older patient. This reduces the risk of dizzy or fainting spells. Avoid alcoholic drinks Do not become pregnant while taking this medicine. Women should inform their doctor if they wish to become pregnant or think they might be pregnant. There is a potential for serious side effects to an unborn child. Talk to your health care professional or pharmacist for more information. Do not breast-feed an infant while taking this medicine. What side effects may I notice from receiving this medicine? Side effects that you should report to your doctor or health care professional as soon as possible: -allergic reactions like skin rash, itching or hives, swelling of the face, lips, or tongue -low blood counts - This drug may decrease the number of white blood cells, red blood cells and platelets. You may be at increased risk for infections and bleeding. -signs of infection - fever or chills, cough, sore throat, pain or difficulty passing urine -signs of decreased platelets or bleeding - bruising,  pinpoint red spots on the skin, black, tarry stools, nosebleeds -signs of decreased red blood cells - unusually weak or tired, fainting spells, lightheadedness -breathing problems -fast or irregular heartbeat -low blood pressure -mouth sores -nausea and vomiting -pain, swelling, redness or irritation at the injection site -pain, tingling, numbness in the hands or feet -swelling of the ankle, feet, hands -weight gain Side effects that usually do not require medical attention (report to your prescriber or health care professional if they continue or are bothersome): -bone pain -complete hair loss including hair on your head, underarms, pubic hair, eyebrows, and eyelashes -diarrhea -excessive tearing -changes in the color of fingernails -loosening of the fingernails -nausea -muscle pain -red flush to skin -sweating -weak or tired This list may not describe all possible side effects. Call your doctor for medical advice about side effects. You may report side effects to FDA at 1-800-FDA-1088. Where should I keep my medicine? This drug is given in a hospital or clinic and will not be stored at home. NOTE: This sheet is a summary. It may not cover all possible information. If you have questions about this medicine, talk to your doctor, pharmacist, or health care provider.  2015, [employer]vier/[employer] Standard. (2013-10-25 22:21:02)

## 2014-06-20 ENCOUNTER — Ambulatory Visit: Payer: Medicare PPO

## 2014-06-21 ENCOUNTER — Telehealth: Payer: Self-pay | Admitting: *Deleted

## 2014-06-21 NOTE — Telephone Encounter (Signed)
Called pt at home and spoke with wife for post chemo follow up call.  Wife stated pt was resting when nurse called.  Per wife, pt did well with first chemo.  Denied nausea/vomiting, on TNA with weight gain; pt able to eat by mouth but very small amount due to problem with swallowing from surgery.  Has chronic constipation due to pt taking Oxycodone every 4 hours, but wife knows to manage pt's constipation ( wife is a retired [employer] scientist ).  Bladder functions fine.  Wife aware of pt's next office appts.

## 2014-06-23 ENCOUNTER — Other Ambulatory Visit: Payer: Self-pay | Admitting: Oncology

## 2014-06-24 ENCOUNTER — Telehealth: Payer: Self-pay | Admitting: *Deleted

## 2014-06-24 DIAGNOSIS — C159 Malignant neoplasm of esophagus, unspecified: Secondary | ICD-10-CM

## 2014-06-24 NOTE — Telephone Encounter (Signed)
Matthew White is on TPN per [place] with labs every Monday, then we do labs and change his Matthew White needle every Wednesday. She has requested all TPN labs be done on Wednesday at [facility] when he is here for treatment and faxed to [place]. Made her aware that we can do this for him. Orders entered.

## 2014-06-25 ENCOUNTER — Other Ambulatory Visit: Payer: Self-pay | Admitting: Orthopedic Surgery

## 2014-06-25 DIAGNOSIS — M545 Low back pain, unspecified: Secondary | ICD-10-CM

## 2014-06-26 ENCOUNTER — Telehealth: Payer: Self-pay | Admitting: *Deleted

## 2014-06-26 ENCOUNTER — Ambulatory Visit
Admission: RE | Admit: 2014-06-26 | Discharge: 2014-06-26 | Disposition: A | Discharge status: Home / Self Care | Payer: Medicare PPO | Source: Ambulatory Visit | Attending: Orthopedic Surgery | Admitting: Orthopedic Surgery

## 2014-06-26 ENCOUNTER — Telehealth: Payer: Self-pay | Admitting: Nurse Practitioner

## 2014-06-26 ENCOUNTER — Other Ambulatory Visit (HOSPITAL_BASED_OUTPATIENT_CLINIC_OR_DEPARTMENT_OTHER): Payer: Medicare PPO

## 2014-06-26 ENCOUNTER — Ambulatory Visit (HOSPITAL_BASED_OUTPATIENT_CLINIC_OR_DEPARTMENT_OTHER): Payer: Medicare PPO | Admitting: Nurse Practitioner

## 2014-06-26 ENCOUNTER — Ambulatory Visit (HOSPITAL_BASED_OUTPATIENT_CLINIC_OR_DEPARTMENT_OTHER): Payer: Medicare PPO

## 2014-06-26 ENCOUNTER — Other Ambulatory Visit: Payer: Self-pay | Admitting: Orthopedic Surgery

## 2014-06-26 ENCOUNTER — Other Ambulatory Visit: Payer: Self-pay | Admitting: *Deleted

## 2014-06-26 VITALS — BP 81/53 | HR 76 | Temp 98.0°F | Resp 18 | Ht 73.0 in | Wt 158.2 lb

## 2014-06-26 DIAGNOSIS — M545 Low back pain, unspecified: Secondary | ICD-10-CM

## 2014-06-26 DIAGNOSIS — M84551D Pathological fracture in neoplastic disease, right femur, subsequent encounter for fracture with routine healing: Principal | ICD-10-CM

## 2014-06-26 DIAGNOSIS — C155 Malignant neoplasm of lower third of esophagus: Secondary | ICD-10-CM

## 2014-06-26 DIAGNOSIS — E46 Unspecified protein-calorie malnutrition: Secondary | ICD-10-CM

## 2014-06-26 DIAGNOSIS — R1310 Dysphagia, unspecified: Secondary | ICD-10-CM

## 2014-06-26 DIAGNOSIS — D489 Neoplasm of uncertain behavior, unspecified: Secondary | ICD-10-CM

## 2014-06-26 DIAGNOSIS — C159 Malignant neoplasm of esophagus, unspecified: Secondary | ICD-10-CM

## 2014-06-26 DIAGNOSIS — E119 Type 2 diabetes mellitus without complications: Secondary | ICD-10-CM

## 2014-06-26 DIAGNOSIS — C7951 Secondary malignant neoplasm of bone: Secondary | ICD-10-CM

## 2014-06-26 DIAGNOSIS — R7989 Other specified abnormal findings of blood chemistry: Secondary | ICD-10-CM

## 2014-06-26 DIAGNOSIS — C7952 Secondary malignant neoplasm of bone marrow: Secondary | ICD-10-CM

## 2014-06-26 DIAGNOSIS — Z5111 Encounter for antineoplastic chemotherapy: Secondary | ICD-10-CM

## 2014-06-26 DIAGNOSIS — C782 Secondary malignant neoplasm of pleura: Secondary | ICD-10-CM

## 2014-06-26 DIAGNOSIS — R110 Nausea: Secondary | ICD-10-CM

## 2014-06-26 DIAGNOSIS — K5900 Constipation, unspecified: Secondary | ICD-10-CM

## 2014-06-26 LAB — CBC WITH DIFFERENTIAL/PLATELET
BASO%: 0.2 % (ref 0.0–2.0)
Basophils Absolute: 0 10*3/uL (ref 0.0–0.1)
EOS ABS: 0.1 10*3/uL (ref 0.0–0.5)
EOS%: 1.2 % (ref 0.0–7.0)
HCT: 32.3 % — ABNORMAL LOW (ref 38.4–49.9)
HEMOGLOBIN: 10.5 g/dL — AB (ref 13.0–17.1)
LYMPH%: 18.8 % (ref 14.0–49.0)
MCH: 34.7 pg — ABNORMAL HIGH (ref 27.2–33.4)
MCHC: 32.5 g/dL (ref 32.0–36.0)
MCV: 106.6 fL — AB (ref 79.3–98.0)
MONO#: 0.5 10*3/uL (ref 0.1–0.9)
MONO%: 13 % (ref 0.0–14.0)
NEUT#: 2.7 10*3/uL (ref 1.5–6.5)
NEUT%: 66.8 % (ref 39.0–75.0)
Platelets: 165 10*3/uL (ref 140–400)
RBC: 3.03 10*6/uL — AB (ref 4.20–5.82)
RDW: 16.9 % — AB (ref 11.0–14.6)
WBC: 4.1 10*3/uL (ref 4.0–10.3)
lymph#: 0.8 10*3/uL — ABNORMAL LOW (ref 0.9–3.3)

## 2014-06-26 LAB — COMPREHENSIVE METABOLIC PANEL (CC13)
ALT: 13 U/L (ref 0–55)
ANION GAP: 8 meq/L (ref 3–11)
AST: 23 U/L (ref 5–34)
Albumin: 2.6 g/dL — ABNORMAL LOW (ref 3.5–5.0)
Alkaline Phosphatase: 90 U/L (ref 40–150)
BILIRUBIN TOTAL: 0.35 mg/dL (ref 0.20–1.20)
BUN: 48.8 mg/dL — AB (ref 7.0–26.0)
CALCIUM: 9.6 mg/dL (ref 8.4–10.4)
CO2: 27 meq/L (ref 22–29)
CREATININE: 0.7 mg/dL (ref 0.7–1.3)
Chloride: 108 mEq/L (ref 98–109)
GLUCOSE: 127 mg/dL (ref 70–140)
Potassium: 5.1 mEq/L (ref 3.5–5.1)
Sodium: 142 mEq/L (ref 136–145)
Total Protein: 6 g/dL — ABNORMAL LOW (ref 6.4–8.3)

## 2014-06-26 LAB — MAGNESIUM (CC13): MAGNESIUM: 1.9 mg/dL (ref 1.5–2.5)

## 2014-06-26 LAB — PHOSPHORUS: Phosphorus: 2.9 mg/dL (ref 2.3–4.6)

## 2014-06-26 LAB — TECHNOLOGIST REVIEW

## 2014-06-26 LAB — LACTATE DEHYDROGENASE (CC13): LDH: 214 U/L (ref 125–245)

## 2014-06-26 MED ORDER — SODIUM CHLORIDE 0.9 % IV SOLN
Freq: Once | INTRAVENOUS | Status: AC
  Administered 2014-06-26: 13:00:00 via INTRAVENOUS

## 2014-06-26 MED ORDER — ONDANSETRON 8 MG/50ML IVPB (CHCC)
8.0000 mg | Freq: Once | INTRAVENOUS | Status: AC
  Administered 2014-06-26: 8 mg via INTRAVENOUS

## 2014-06-26 MED ORDER — SODIUM CHLORIDE 0.9 % IJ SOLN
10.0000 mL | INTRAMUSCULAR | Status: DC | PRN
  Administered 2014-06-26: 10 mL
  Filled 2014-06-26: qty 10

## 2014-06-26 MED ORDER — DEXAMETHASONE SODIUM PHOSPHATE 10 MG/ML IJ SOLN
10.0000 mg | Freq: Once | INTRAMUSCULAR | Status: AC
  Administered 2014-06-26: 10 mg via INTRAVENOUS

## 2014-06-26 MED ORDER — HEPARIN SOD (PORK) LOCK FLUSH 100 UNIT/ML IV SOLN
500.0000 [IU] | Freq: Once | INTRAVENOUS | Status: AC | PRN
  Administered 2014-06-26: 500 [IU]
  Filled 2014-06-26: qty 5

## 2014-06-26 MED ORDER — DEXTROSE 5 % IV SOLN
20.0000 mg/m2 | Freq: Once | INTRAVENOUS | Status: AC
  Administered 2014-06-26: 40 mg via INTRAVENOUS
  Filled 2014-06-26: qty 4

## 2014-06-26 NOTE — Telephone Encounter (Signed)
Per staff message and POF I have scheduled appts. Advised scheduler of appts. JMW  

## 2014-06-26 NOTE — Progress Notes (Signed)
[place] OFFICE PROGRESS NOTE   Diagnosis:  Esophagus cancer.  INTERVAL HISTORY:   Matthew White returns as scheduled. He completed cycle 1 week 1 Taxotere 06/19/2014. He denies nausea/vomiting. He has a single mouth sore which predated chemotherapy and is improving. He is utilizing Magic mouthwash. Bowels overall moving regularly as long as he takes a stool softener. He has occasional mild tingling in the fingertips. He continues to have pain at the right hip/leg. The pain worsens significantly with any weightbearing. He reports he is scheduled for a scan later today. He is able to ambulate a short distance with a walker. He continues TPN. He is gaining weight.  Objective:  Vital signs in last 24 hours:  Blood pressure 81/53, pulse 76, temperature 98 F (36.7 C), temperature source Oral, resp. rate 18, height 6' 1"  (1.854 m), weight 158 lb 3.2 oz (71.759 kg), SpO2 97.00%.    HEENT: No thrush or ulcerations. Lymphatics: Small right low neck/scalene lymph node. Resp: Lungs clear. Cardio: Regular cardiac rhythm. GI: Abdomen soft and nontender. No hepatomegaly. Vascular: No leg edema.  Port-A-Cath site without erythema.  Lab Results:  Lab Results  Component Value Date   WBC 4.1 06/26/2014   HGB 10.5* 06/26/2014   HCT 32.3* 06/26/2014   MCV 106.6* 06/26/2014   PLT 165 06/26/2014   NEUTROABS 2.7 06/26/2014    Imaging:  No results found.  Medications: I have reviewed the patient's current medications.  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  -Status post an esophagogastrectomy 04/20/2013 with the pathology confirming a pT1b,pN1 tumor, grade 1 with negative surgical margins.  2. History of Barrett's esophagus.  3. Adult onset diabetes.  4.  Odynophagia/early satiety. The odynophagia is better.  5. Postoperative hoarseness-status post collagen injection therapy at [facility].  6. Dysphagia progressive over the past 2-4 months. He is sheduled for a dilatation procedure in the near future.  7. Chest CT 02/14/2014 showed a new masslike partially calcified lesion in the right lower lobe peripherally. There were some new lower lobe reticulonodular opacities. A peripherally located right lower lobe superior segment nodule was larger.  PET scan 02/19/2014 with evidence of multiple hypermetabolic lung nodules, hypermetabolic bone lesions, and a hypermetabolic focus at the left side of the prostate  CT biopsy of a right lower lobe subpleural nodule 03/07/2014 confirmed metastatic adenocarcinoma consistent with esophagus cancer. HER-2 negative.  Cycle 1 CAPOX beginning 03/18/2014.  Cycle 2 CAPOX beginning 04/08/2014.  Cycle 3 CAPOX beginning 04/29/2014.  Restaging chest CT 05/17/2014 with progression of multiple bone metastases including a nearly 4 cm lesion in the T2 vertebral body essentially destroying the vertebral body and penetrating through the posterior cortex into the anterior spinal canal. Another large lesion seen in the left clavicular head. Other scattered smaller rib and spinal lesions identified. Multiple masslike opacities scattered in the lungs. Some lesions progressing in the interval and others decreasing.  He completed palliative radiation to the left clavicle, T2, and right femur. Initiation of Taxotere 06/19/2014 weekly x3 followed by a one-week break. 8. Port-A-Cath placement 03/11/2014  9. Severe pain at the left clavicle after a fall, plain x-ray of the left clavicle 03/18/2014 confirmed a lytic lesion of the medial left clavicle, PET scan at [facility] 02/19/2014 revealed a hypermetabolic medial left clavicle lesion.  10. Constipation-likely secondary to narcotics.  11.  Delayed nausea following cycle 2 CAPOX. Aloxi added beginning  with cycle 3.  12. Impending pathologic fracture right femur status post rod placement 05/03/2014.  13. facial MRSA abscess status post incision and drainage procedure 05/30/2014  14. Malnutrition-now maintained on TNA with weight gain.  15. Elevated BUN. Question related to TPN. Improved 06/26/2014.    Disposition: Matthew White appears stable. Plan to proceed with cycle 1 week 2 Taxotere today as scheduled. He will return on 07/03/2014 to complete cycle 1. We will see him in followup on 07/17/2014 prior to proceeding with cycle 2 Taxotere. He will contact the office in the interim with any problems.    Ned Card ANP/GNP-BC   06/26/2014  12:09 PM

## 2014-06-26 NOTE — Telephone Encounter (Signed)
Call from [facility] with [facility] to confirm orders for weekly lab. Pt will have CBC CMET Mag and Phosphorus done weekly in this office. Results to [place] for TPN.

## 2014-06-26 NOTE — Telephone Encounter (Signed)
Pt confirmed labs/ov per 07/15 POF, gave pt AVS and msg Michelle to add chemo schedule .Marland Kitchen..KJ

## 2014-06-26 NOTE — Patient Instructions (Signed)
Docetaxel injection  What is this medicine?  DOCETAXEL (doe se TAX el) is a chemotherapy drug. It targets fast dividing cells, like cancer cells, and causes these cells to die. This medicine is used to treat many types of cancers like breast cancer, certain stomach cancers, head and neck cancer, lung cancer, and prostate cancer.  This medicine may be used for other purposes; ask your health care provider or pharmacist if you have questions.  COMMON BRAND NAME(S): Docefrez, Taxotere  What should I tell my health care provider before I take this medicine?  They need to know if you have any of these conditions:  -infection (especially a virus infection such as chickenpox, cold sores, or herpes)  -liver disease  -low blood counts, like low white cell, platelet, or red cell counts  -an unusual or allergic reaction to docetaxel, polysorbate 80, other chemotherapy agents, other medicines, foods, dyes, or preservatives  -pregnant or trying to get pregnant  -breast-feeding  How should I use this medicine?  This drug is given as an infusion into a vein. It is administered in a hospital or clinic by a specially trained health care professional.  Talk to your pediatrician regarding the use of this medicine in children. Special care may be needed.  Overdosage: If you think you have taken too much of this medicine contact a poison control center or emergency room at once.  NOTE: This medicine is only for you. Do not share this medicine with others.  What if I miss a dose?  It is important not to miss your dose. Call your doctor or health care professional if you are unable to keep an appointment.  What may interact with this medicine?  -cyclosporine  -erythromycin  -ketoconazole  -medicines to increase blood counts like filgrastim, pegfilgrastim, sargramostim  -vaccines  Talk to your doctor or health care professional before taking any of these medicines:  -acetaminophen  -aspirin  -ibuprofen  -ketoprofen  -naproxen  This list  may not describe all possible interactions. Give your health care provider a list of all the medicines, herbs, non-prescription drugs, or dietary supplements you use. Also tell them if you smoke, drink alcohol, or use illegal drugs. Some items may interact with your medicine.  What should I watch for while using this medicine?  Your condition will be monitored carefully while you are receiving this medicine. You will need important blood work done while you are taking this medicine.  This drug may make you feel generally unwell. This is not uncommon, as chemotherapy can affect healthy cells as well as cancer cells. Report any side effects. Continue your course of treatment even though you feel ill unless your doctor tells you to stop.  In some cases, you may be given additional medicines to help with side effects. Follow all directions for their use.  Call your doctor or health care professional for advice if you get a fever, chills or sore throat, or other symptoms of a cold or flu. Do not treat yourself. This drug decreases your body's ability to fight infections. Try to avoid being around people who are sick.  This medicine may increase your risk to bruise or bleed. Call your doctor or health care professional if you notice any unusual bleeding.  Be careful brushing and flossing your teeth or using a toothpick because you may get an infection or bleed more easily. If you have any dental work done, tell your dentist you are receiving this medicine.  Avoid taking products that   contain aspirin, acetaminophen, ibuprofen, naproxen, or ketoprofen unless instructed by your doctor. These medicines may hide a fever.  This medicine contains an alcohol in the product. You may get drowsy or dizzy. Do not drive, use machinery, or do anything that needs mental alertness until you know how this medicine affects you. Do not stand or sit up quickly, especially if you are an older patient. This reduces the risk of dizzy or  fainting spells. Avoid alcoholic drinks  Do not become pregnant while taking this medicine. Women should inform their doctor if they wish to become pregnant or think they might be pregnant. There is a potential for serious side effects to an unborn child. Talk to your health care professional or pharmacist for more information. Do not breast-feed an infant while taking this medicine.  What side effects may I notice from receiving this medicine?  Side effects that you should report to your doctor or health care professional as soon as possible:  -allergic reactions like skin rash, itching or hives, swelling of the face, lips, or tongue  -low blood counts - This drug may decrease the number of white blood cells, red blood cells and platelets. You may be at increased risk for infections and bleeding.  -signs of infection - fever or chills, cough, sore throat, pain or difficulty passing urine  -signs of decreased platelets or bleeding - bruising, pinpoint red spots on the skin, black, tarry stools, nosebleeds  -signs of decreased red blood cells - unusually weak or tired, fainting spells, lightheadedness  -breathing problems  -fast or irregular heartbeat  -low blood pressure  -mouth sores  -nausea and vomiting  -pain, swelling, redness or irritation at the injection site  -pain, tingling, numbness in the hands or feet  -swelling of the ankle, feet, hands  -weight gain  Side effects that usually do not require medical attention (report to your prescriber or health care professional if they continue or are bothersome):  -bone pain  -complete hair loss including hair on your head, underarms, pubic hair, eyebrows, and eyelashes  -diarrhea  -excessive tearing  -changes in the color of fingernails  -loosening of the fingernails  -nausea  -muscle pain  -red flush to skin  -sweating  -weak or tired  This list may not describe all possible side effects. Call your doctor for medical advice about side effects. You may report side  effects to FDA at 1-800-FDA-1088.  Where should I keep my medicine?  This drug is given in a hospital or clinic and will not be stored at home.  NOTE: This sheet is a summary. It may not cover all possible information. If you have questions about this medicine, talk to your doctor, pharmacist, or health care provider.   2015, [employer]. (2013-10-25 22:21:02)

## 2014-06-28 ENCOUNTER — Other Ambulatory Visit: Payer: Self-pay | Admitting: Nurse Practitioner

## 2014-06-28 ENCOUNTER — Ambulatory Visit
Admission: RE | Admit: 2014-06-28 | Discharge: 2014-06-28 | Disposition: A | Discharge status: Home / Self Care | Payer: Medicare PPO | Source: Ambulatory Visit | Admitting: Radiation Oncology

## 2014-06-28 ENCOUNTER — Telehealth: Payer: Self-pay | Admitting: *Deleted

## 2014-06-28 ENCOUNTER — Encounter: Payer: Self-pay | Admitting: Radiation Oncology

## 2014-06-28 ENCOUNTER — Ambulatory Visit
Admission: RE | Admit: 2014-06-28 | Discharge: 2014-06-28 | Disposition: A | Discharge status: Home / Self Care | Payer: Medicare PPO | Source: Ambulatory Visit | Attending: Radiation Oncology | Admitting: Radiation Oncology

## 2014-06-28 VITALS — BP 99/51 | HR 78 | Temp 97.8°F | Resp 20 | Wt 161.8 lb

## 2014-06-28 DIAGNOSIS — C7951 Secondary malignant neoplasm of bone: Secondary | ICD-10-CM | POA: Insufficient documentation

## 2014-06-28 DIAGNOSIS — M79609 Pain in unspecified limb: Secondary | ICD-10-CM | POA: Diagnosis not present

## 2014-06-28 DIAGNOSIS — Z79899 Other long term (current) drug therapy: Secondary | ICD-10-CM | POA: Insufficient documentation

## 2014-06-28 DIAGNOSIS — C159 Malignant neoplasm of esophagus, unspecified: Secondary | ICD-10-CM

## 2014-06-28 DIAGNOSIS — C7952 Secondary malignant neoplasm of bone marrow: Secondary | ICD-10-CM

## 2014-06-28 DIAGNOSIS — Z510 Encounter for antineoplastic radiation therapy: Secondary | ICD-10-CM | POA: Insufficient documentation

## 2014-06-28 DIAGNOSIS — C155 Malignant neoplasm of lower third of esophagus: Secondary | ICD-10-CM | POA: Diagnosis not present

## 2014-06-28 DIAGNOSIS — M545 Low back pain, unspecified: Secondary | ICD-10-CM | POA: Insufficient documentation

## 2014-06-28 DIAGNOSIS — M84551A Pathological fracture in neoplastic disease, right femur, initial encounter for fracture: Secondary | ICD-10-CM

## 2014-06-28 DIAGNOSIS — Z794 Long term (current) use of insulin: Secondary | ICD-10-CM | POA: Insufficient documentation

## 2014-06-28 DIAGNOSIS — D489 Neoplasm of uncertain behavior, unspecified: Secondary | ICD-10-CM

## 2014-06-28 NOTE — Progress Notes (Signed)
Recon add on Bone metsm c/o right leg pain severe and now started with low back pain, took  2 oral percocet just before leaving from home today, takes Oxycodone 20mg  every 12 hours, and takes ibuprofen in between prn, took compazine this am felt queezy in stomach, in w/c, unsteady,  Had chemotherapy 06/26/14, appetite lousy, poor taste, trouble swallowing still, fatigued Chokes on some foods, drinks boost or ensure  3-4 cans a day CT results to be discussed with Dr.mannning today 2:09 PM

## 2014-06-28 NOTE — Progress Notes (Signed)
Please see the Nurse Progress Note in the MD Initial Consult Encounter for this patient. 

## 2014-06-28 NOTE — Progress Notes (Signed)
Radiation Oncology         [phone] ________________________________  Name: Matthew White MRN: [id number]  Date: 06/28/2014  DOB: 08-28-1934  Follow-Up Visit Note  CC: Precious Reel, MD  Precious Reel, MD  Diagnosis:   80 yo man with new painful lumbar and sacral spine metastases from esophageal cancer.  Interval Since Last Radiation:  3  weeks  Narrative:  The patient returns today with increasing lower back pain and evidence of new spine metastases on CT imaging. He also continues to have pain in the right femur.                     ALLERGIES:  is allergic to ivp dye.  Meds: Current Outpatient Prescriptions  Medication Sig Dispense Refill  . ADULT TPN Inject into the vein continuous. [facility]      . docusate sodium (COLACE) 100 MG capsule Take 100 mg by mouth 2 (two) times daily as needed for mild constipation.       . ENSURE (ENSURE) Take 237 mLs by mouth 4 (four) times daily as needed (for supplement.  Will use Boost or Ensure.).       Marland Kitchen ibuprofen (ADVIL,MOTRIN) 200 MG tablet Take 200 mg by mouth 2 (two) times daily as needed for mild pain.       Marland Kitchen insulin glargine (LANTUS) 100 UNIT/ML injection Inject 10 Units into the skin at bedtime.      . insulin lispro (HUMALOG) 100 UNIT/ML injection Inject 0-8 Units into the skin every evening. Patient uses a sliding scale from 0-8 units      . lactose free nutrition (BOOST) LIQD Take 237 mLs by mouth 3 (three) times daily between meals.      . lidocaine-prilocaine (EMLA) cream Apply 1 application topically as needed (for port).       Marland Kitchen loperamide (IMODIUM A-D) 2 MG tablet Take 2 mg by mouth 4 (four) times daily as needed for diarrhea or loose stools.      . mirtazapine (REMERON) 15 MG tablet Take 1 tablet (15 mg total) by mouth at bedtime.  30 tablet  2  . Omega-3 Fatty Acids (FISH OIL PO) Take 1 capsule by mouth 2 (two) times daily.      Marland Kitchen omeprazole (PRILOSEC) 20 MG capsule Take 20 mg by mouth 2 (two) times daily before a  meal.       . OxyCODONE (OXYCONTIN) 20 mg T12A 12 hr tablet Take 1 tablet (20 mg total) by mouth every 12 (twelve) hours.  60 tablet  0  . oxyCODONE-acetaminophen (PERCOCET/ROXICET) 5-325 MG per tablet Take 2 tablets by mouth every 4 (four) hours as needed for severe pain.  100 tablet  0  . polyethylene glycol (MIRALAX / GLYCOLAX) packet Take 17 g by mouth daily as needed for mild constipation.       . prochlorperazine (COMPAZINE) 5 MG tablet Take 1 tablet (5 mg total) by mouth every 6 (six) hours as needed for nausea or vomiting.  30 tablet  2  . ALPRAZolam (XANAX) 1 MG tablet Take 1 tablet (1 mg total) by mouth at bedtime as needed for anxiety.  30 tablet  0   No current facility-administered medications for this encounter.    Physical Findings: The patient is in no acute distress. Patient is alert and oriented.  weight is 161 lb 12.8 oz (73.392 kg). His oral temperature is 97.8 F (36.6 C). His blood pressure is 99/51 and his  pulse is 78. His respiration is 20 and oxygen saturation is 99%. . Motor strength is intact in lower chart is. Gait is unsteady without assistance the patient localizes pain to his right sacrum and her lumbar spine. No significant changes.  Lab Findings: Lab Results  Component Value Date   WBC 4.1 06/26/2014   HGB 10.5* 06/26/2014   HCT 32.3* 06/26/2014   MCV 106.6* 06/26/2014   PLT 165 06/26/2014    @LASTCHEM @  Radiographic Findings: Ct Lumbar Spine Wo Contrast  06/26/2014   CLINICAL DATA:  Low back pain.  Right hip pain.  Esophageal cancer.  EXAM: CT LUMBAR SPINE WITHOUT CONTRAST  TECHNIQUE: Multidetector CT imaging of the lumbar spine was performed without intravenous contrast administration. Multiplanar CT image reconstructions were also generated.  COMPARISON:  PET scan at [facility] 02/19/2014. PET scan at [facility] 01/04/2013.  FINDINGS: The lumbar spine is imaged from the midbody of T12 through S3. Multiple lytic metastatic lesions demonstrate  significant interval progression. A new 9 mm lesion is present on the right in the L2 vertebral body. The lesion evident in scratch study lesion in the right transverse process of L2 has expanded beyond the bone and now measures 19 x 14 mm. A large lesion at L2-3 has eroded through the inferior endplate, measuring 23 x 28 x 26 mm. A more focal anterior right inferior endplate lesion measures 13 x 6 mm.  A lesion in the posterior lateral aspect of the the L4 vertebral body extends into the pedicle in beyond the bone resulting in significant left lateral recess narrowing. This lesion measures 2.7 x 2.4 cm. There is significant expansion of a lesion in the L4 spinous process, now measuring 3.0 x 4.6 cm. The lesion in the spinous process of L5 measures 4.8 x 3.5 cm. There is significant progression of the lesion on the right at S1, now measuring 4.6 x 5.2 cm. This extends into the right S1 neural foramen and may produce radicular symptoms. A 13 mm lesion is present in the posterior elements at S2 on the left.  Standard degenerative changes of the lumbar spine contribute to the lateral recess and foraminal narrowing bilaterally at L4-5 and L5-S1.  IMPRESSION: 1. Significant progression of multiple lytic metastatic lesions throughout the lumbar spine. 2. The largest right-sided lesion is at S1 measuring 4.6 x 5.2 cm with encroachment into the right S1 neural foramen. 3. Progression of lesions in the spinous process at L4 and L5. 4. Extraosseous 6 extension of a a lesion on the left at L4 results in significant left lateral recess narrowing. 5. Additional lesions as described above without focal stenosis. 6. Degenerative changes in the lumbar spine contribute to lateral recess and foraminal narrowing at L4-5 and L5-S1.   Electronically Signed   By: Lawrence Santiago M.D.   On: 06/26/2014 19:11   Ct Hip Right Wo Contrast  06/27/2014   CLINICAL DATA:  Right hip pain. History of metastatic esophageal carcinoma.  EXAM: CT OF  THE RIGHT HIP WITHOUT CONTRAST  TECHNIQUE: Multidetector CT imaging was performed according to the standard protocol. Multiplanar CT image reconstructions were also generated.  COMPARISON:  PET CT scan 01/04/2013. CT chest, abdomen and pelvis 12/29/2012. PET CT scan 02/19/2014.  FINDINGS: There is partial visualization of a large destructive lesion in the sacrum. Please refer to report of dedicated lumbar spine CT scan the same day. A destructive lesion in the right ilium measuring 2.5 x 1.3 cm on image 6 is identified.  The patient has a dynamic hip screw and intra medullary nail in the right hip. There is a large destructive lesion in the lesser trochanter measuring approximately 3.1 x 3.2 cm in the axial plane by 6.8 cm craniocaudal. The right hip is located. The patient has a nondisplaced fracture of the right greater trochanter. There is also a nondisplaced fracture through the anterior cortex of the right femoral neck at its base. No other focal bony lesion is identified. Soft tissue structures demonstrate no focal abnormality.  IMPRESSION: Dynamic hip screw and intramedullary nail in the right hip. Nondisplaced fracture of the right greater trochanter is identified and there is a fracture through the anterior cortex of the base of the right femoral neck.  Metastatic disease about the pelvis and right hip with lesions identified in the sacrum, right ilium and lesser trochanter.   Electronically Signed   By: Inge Rise M.D.   On: 06/27/2014 08:39    Impression:  The patient is suffering with increasing back pain and recent CT shows evidence of new progressive bone metastases at multiple levels of the lumbar spine and sacrum. The patient may benefit from palliative radiotherapy to these areas.  Plan:  Today, I talked to the patient and family about the findings and work-up thus far.  We discussed the natural history of spinal metastatic disease and general treatment, highlighting the role or  radiotherapy in the management.  We discussed the available radiation techniques, and focused on the details of logistics and delivery.  We reviewed the anticipated acute and late sequelae associated with radiation in this setting.  The patient was encouraged to ask questions that I answered to the best of my ability. The patient would like to proceed with radiation and will be scheduled for CT simulation.  I spent 20 minutes minutes face to face with the patient and more than 50% of that time was spent in counseling and/or coordination of care.   _____________________________________  Sheral Apley. Tammi Klippel, M.D.

## 2014-06-28 NOTE — Telephone Encounter (Signed)
Called patient home,spoke with wife, asked if I could speak with Matthew White, she was in the garden, said his pain in mostly in his right leg where the rod is, occasional low back pain, asked me if I could tell her the results of the CT scans, She spoke with her husband and he wants to get started today ,doesn't want to wait till Monday, I informed her I couldn't tell her the results ,MD will discuss those with them, I will inform  Dr.Manning of them wanting to come in today,I will call them back with time to come in,they live 5 minutes from the hospital stated wife 10:44 AM

## 2014-06-28 NOTE — Progress Notes (Signed)
  Radiation Oncology         [phone] ________________________________  Name: Matthew White MRN: [id number]  Date: 06/28/2014  DOB: 11-23-1934  SIMULATION AND TREATMENT PLANNING NOTE  DIAGNOSIS:  80 yo man with new painful lumbar and sacral spine metastases from the lower third of esophagus cancer  NARRATIVE:  The patient was brought to the [facility].  Identity was confirmed.  All relevant records and images related to the planned course of therapy were reviewed.  The patient freely provided informed written consent to proceed with treatment after reviewing the details related to the planned course of therapy. The consent form was witnessed and verified by the simulation staff.  Then, the patient was set-up in a stable reproducible  supine position for radiation therapy.  CT images were obtained.  Surface markings were placed.  The CT images were loaded into the planning software.  Then the target and avoidance structures were contoured.  Treatment planning then occurred.  The radiation prescription was entered and confirmed.  Then, I designed and supervised the construction of a total of 2 multileaf collimators to shape radiation around the lumbar spine from L1 through the SI joints inclusive while shielding the kidneys and uninvolved bowel.  These were two medically necessary complex treatment devices .  I have requested : Isodose Plan.  I have ordered:Nutrition Consult  PLAN:  The patient will receive 30 Gy in 10 fractions.  ________________________________  Sheral Apley Tammi Klippel, M.D.

## 2014-06-28 NOTE — Telephone Encounter (Signed)
compleetd

## 2014-06-28 NOTE — Telephone Encounter (Signed)
called patient home,spoke with wife,. Becky, per Dr.Manning,  To come in fro 2pm visit, be here 145 to register in the lobby and once arrived, and has a pager, I would meet them at the elevator ground floor Becky saif they would be here then and to thank Dr.mannng and for the return call today 11:43 AM

## 2014-06-30 ENCOUNTER — Other Ambulatory Visit: Payer: Self-pay | Admitting: Oncology

## 2014-07-01 ENCOUNTER — Encounter: Payer: Self-pay | Admitting: Radiation Oncology

## 2014-07-01 ENCOUNTER — Ambulatory Visit
Admission: RE | Admit: 2014-07-01 | Discharge: 2014-07-01 | Disposition: A | Discharge status: Home / Self Care | Payer: Medicare PPO | Source: Ambulatory Visit | Attending: Radiation Oncology | Admitting: Radiation Oncology

## 2014-07-01 DIAGNOSIS — Z510 Encounter for antineoplastic radiation therapy: Secondary | ICD-10-CM | POA: Diagnosis not present

## 2014-07-01 NOTE — Progress Notes (Signed)
Simulation Verification Note  The patient was brought to the treatment unit and placed in the planned treatment position. The clinical setup was verified. Then port films were obtained and uploaded to the radiation oncology medical record software.  The treatment beams were carefully compared against the planned radiation fields. The position location and shape of the radiation fields was reviewed. They targeted volume of tissue appears to be appropriately covered by the radiation beams. Organs at risk appear to be excluded as planned.  Based on my personal review, I approved the simulation verification. The patient's treatment will proceed as planned.  -----------------------------------  Eppie Gibson, MD

## 2014-07-02 ENCOUNTER — Ambulatory Visit
Admission: RE | Admit: 2014-07-02 | Discharge: 2014-07-02 | Disposition: A | Discharge status: Home / Self Care | Payer: Medicare PPO | Source: Ambulatory Visit | Attending: Radiation Oncology | Admitting: Radiation Oncology

## 2014-07-02 DIAGNOSIS — Z510 Encounter for antineoplastic radiation therapy: Secondary | ICD-10-CM | POA: Diagnosis not present

## 2014-07-03 ENCOUNTER — Other Ambulatory Visit (HOSPITAL_BASED_OUTPATIENT_CLINIC_OR_DEPARTMENT_OTHER): Payer: Medicare PPO

## 2014-07-03 ENCOUNTER — Ambulatory Visit
Admission: RE | Admit: 2014-07-03 | Discharge: 2014-07-03 | Disposition: A | Discharge status: Home / Self Care | Payer: Medicare PPO | Source: Ambulatory Visit | Attending: Radiation Oncology | Admitting: Radiation Oncology

## 2014-07-03 ENCOUNTER — Ambulatory Visit (HOSPITAL_BASED_OUTPATIENT_CLINIC_OR_DEPARTMENT_OTHER): Payer: Medicare PPO

## 2014-07-03 VITALS — BP 89/51 | HR 101 | Temp 98.2°F | Resp 18

## 2014-07-03 DIAGNOSIS — C7952 Secondary malignant neoplasm of bone marrow: Secondary | ICD-10-CM

## 2014-07-03 DIAGNOSIS — M84551D Pathological fracture in neoplastic disease, right femur, subsequent encounter for fracture with routine healing: Principal | ICD-10-CM

## 2014-07-03 DIAGNOSIS — C782 Secondary malignant neoplasm of pleura: Secondary | ICD-10-CM

## 2014-07-03 DIAGNOSIS — C7951 Secondary malignant neoplasm of bone: Secondary | ICD-10-CM

## 2014-07-03 DIAGNOSIS — Z510 Encounter for antineoplastic radiation therapy: Secondary | ICD-10-CM | POA: Diagnosis not present

## 2014-07-03 DIAGNOSIS — D489 Neoplasm of uncertain behavior, unspecified: Secondary | ICD-10-CM

## 2014-07-03 DIAGNOSIS — C155 Malignant neoplasm of lower third of esophagus: Secondary | ICD-10-CM

## 2014-07-03 DIAGNOSIS — Z5111 Encounter for antineoplastic chemotherapy: Secondary | ICD-10-CM

## 2014-07-03 DIAGNOSIS — C159 Malignant neoplasm of esophagus, unspecified: Secondary | ICD-10-CM

## 2014-07-03 LAB — COMPREHENSIVE METABOLIC PANEL (CC13)
ALT: 11 U/L (ref 0–55)
ANION GAP: 7 meq/L (ref 3–11)
AST: 24 U/L (ref 5–34)
Albumin: 2.5 g/dL — ABNORMAL LOW (ref 3.5–5.0)
Alkaline Phosphatase: 102 U/L (ref 40–150)
BUN: 48.1 mg/dL — ABNORMAL HIGH (ref 7.0–26.0)
CALCIUM: 9.8 mg/dL (ref 8.4–10.4)
CHLORIDE: 107 meq/L (ref 98–109)
CO2: 28 mEq/L (ref 22–29)
Creatinine: 0.7 mg/dL (ref 0.7–1.3)
Glucose: 77 mg/dl (ref 70–140)
Potassium: 4.5 mEq/L (ref 3.5–5.1)
SODIUM: 141 meq/L (ref 136–145)
TOTAL PROTEIN: 6.2 g/dL — AB (ref 6.4–8.3)
Total Bilirubin: 0.36 mg/dL (ref 0.20–1.20)

## 2014-07-03 LAB — LACTATE DEHYDROGENASE (CC13): LDH: 226 U/L (ref 125–245)

## 2014-07-03 LAB — CBC WITH DIFFERENTIAL/PLATELET
BASO%: 0.5 % (ref 0.0–2.0)
Basophils Absolute: 0 10*3/uL (ref 0.0–0.1)
EOS ABS: 0 10*3/uL (ref 0.0–0.5)
EOS%: 0.5 % (ref 0.0–7.0)
HCT: 30.5 % — ABNORMAL LOW (ref 38.4–49.9)
HGB: 9.7 g/dL — ABNORMAL LOW (ref 13.0–17.1)
LYMPH%: 22.3 % (ref 14.0–49.0)
MCH: 34 pg — ABNORMAL HIGH (ref 27.2–33.4)
MCHC: 31.8 g/dL — ABNORMAL LOW (ref 32.0–36.0)
MCV: 107 fL — AB (ref 79.3–98.0)
MONO#: 0.6 10*3/uL (ref 0.1–0.9)
MONO%: 15.7 % — AB (ref 0.0–14.0)
NEUT#: 2.4 10*3/uL (ref 1.5–6.5)
NEUT%: 61 % (ref 39.0–75.0)
PLATELETS: 257 10*3/uL (ref 140–400)
RBC: 2.85 10*6/uL — ABNORMAL LOW (ref 4.20–5.82)
RDW: 16 % — ABNORMAL HIGH (ref 11.0–14.6)
WBC: 4 10*3/uL (ref 4.0–10.3)
lymph#: 0.9 10*3/uL (ref 0.9–3.3)

## 2014-07-03 LAB — TECHNOLOGIST REVIEW

## 2014-07-03 LAB — MAGNESIUM (CC13): Magnesium: 1.9 mg/dl (ref 1.5–2.5)

## 2014-07-03 LAB — PHOSPHORUS: Phosphorus: 3.4 mg/dL (ref 2.3–4.6)

## 2014-07-03 MED ORDER — HEPARIN SOD (PORK) LOCK FLUSH 100 UNIT/ML IV SOLN
500.0000 [IU] | Freq: Once | INTRAVENOUS | Status: AC | PRN
  Administered 2014-07-03: 500 [IU]
  Filled 2014-07-03: qty 5

## 2014-07-03 MED ORDER — DOCETAXEL CHEMO INJECTION 160 MG/16ML
20.0000 mg/m2 | Freq: Once | INTRAVENOUS | Status: AC
  Administered 2014-07-03: 40 mg via INTRAVENOUS
  Filled 2014-07-03: qty 4

## 2014-07-03 MED ORDER — SODIUM CHLORIDE 0.9 % IV SOLN
Freq: Once | INTRAVENOUS | Status: AC
Start: 2014-07-03 — End: 2014-07-03
  Administered 2014-07-03: 12:00:00 via INTRAVENOUS

## 2014-07-03 MED ORDER — DEXAMETHASONE SODIUM PHOSPHATE 10 MG/ML IJ SOLN
10.0000 mg | Freq: Once | INTRAMUSCULAR | Status: AC
Start: 2014-07-03 — End: 2014-07-03
  Administered 2014-07-03: 10 mg via INTRAVENOUS

## 2014-07-03 MED ORDER — ONDANSETRON 8 MG/NS 50 ML IVPB
INTRAVENOUS | Status: AC
  Filled 2014-07-03: qty 8

## 2014-07-03 MED ORDER — SODIUM CHLORIDE 0.9 % IJ SOLN
10.0000 mL | INTRAMUSCULAR | Status: DC | PRN
  Administered 2014-07-03: 10 mL
  Filled 2014-07-03: qty 10

## 2014-07-03 MED ORDER — ONDANSETRON 8 MG/50ML IVPB (CHCC)
8.0000 mg | Freq: Once | INTRAVENOUS | Status: AC
  Administered 2014-07-03: 8 mg via INTRAVENOUS

## 2014-07-03 MED ORDER — DEXAMETHASONE SODIUM PHOSPHATE 10 MG/ML IJ SOLN
INTRAMUSCULAR | Status: AC
  Filled 2014-07-03: qty 1

## 2014-07-03 NOTE — Patient Instructions (Signed)
[facility] Discharge Instructions for Patients Receiving Chemotherapy  Today you received the following chemotherapy agent Taxotere.  To help prevent nausea and vomiting after your treatment, we encourage you to take your nausea medication.   If you develop nausea and vomiting that is not controlled by your nausea medication, call the clinic.   BELOW ARE SYMPTOMS THAT SHOULD BE REPORTED IMMEDIATELY:  *FEVER GREATER THAN 100.5 F  *CHILLS WITH OR WITHOUT FEVER  NAUSEA AND VOMITING THAT IS NOT CONTROLLED WITH YOUR NAUSEA MEDICATION  *UNUSUAL SHORTNESS OF BREATH  *UNUSUAL BRUISING OR BLEEDING  TENDERNESS IN MOUTH AND THROAT WITH OR WITHOUT PRESENCE OF ULCERS  *URINARY PROBLEMS  *BOWEL PROBLEMS  UNUSUAL RASH Items with * indicate a potential emergency and should be followed up as soon as possible.  Feel free to call the clinic you have any questions or concerns. The clinic phone number is [phone].

## 2014-07-04 ENCOUNTER — Ambulatory Visit
Admission: RE | Admit: 2014-07-04 | Discharge: 2014-07-04 | Disposition: A | Discharge status: Home / Self Care | Payer: Medicare PPO | Source: Ambulatory Visit | Attending: Radiation Oncology | Admitting: Radiation Oncology

## 2014-07-04 DIAGNOSIS — Z510 Encounter for antineoplastic radiation therapy: Secondary | ICD-10-CM | POA: Diagnosis not present

## 2014-07-05 ENCOUNTER — Ambulatory Visit
Admission: RE | Admit: 2014-07-05 | Discharge: 2014-07-05 | Disposition: A | Discharge status: Home / Self Care | Payer: Medicare PPO | Source: Ambulatory Visit | Attending: Radiation Oncology | Admitting: Radiation Oncology

## 2014-07-05 ENCOUNTER — Encounter: Payer: Self-pay | Admitting: Radiation Oncology

## 2014-07-05 VITALS — BP 81/45 | HR 108 | Resp 16 | Wt 168.2 lb

## 2014-07-05 DIAGNOSIS — Z510 Encounter for antineoplastic radiation therapy: Secondary | ICD-10-CM | POA: Diagnosis not present

## 2014-07-05 DIAGNOSIS — C7952 Secondary malignant neoplasm of bone marrow: Principal | ICD-10-CM

## 2014-07-05 DIAGNOSIS — C7951 Secondary malignant neoplasm of bone: Secondary | ICD-10-CM

## 2014-07-05 NOTE — Progress Notes (Signed)
Blood pressure low today. Reports yesterday he took approximately ten step without the aid of a walker. Wife reports she has noted often when he walks with his walker he drags both legs even his left. Reports he continues to take his pain medication every four hours but, doesn't feel relief until second dose. Reports pain in his right leg at the site of the rod is less. TPN noted. Reports intermittent back pain eases off as the day progresses but, never goes away completely. Reports tingling in his finger tips but, none in his feet or legs. Reports drinking three cans of supplement on an average day and small meals by mouth. Weight gain noted. Dry mouth reported. Denies diarrhea. Reports he took compazine one day last week to word off nausea following chemotherapy. Denies emesis.

## 2014-07-05 NOTE — Progress Notes (Signed)
Department of Radiation Oncology  Phone:  [phone] Fax:        [phone]  Weekly Treatment Note    Name: Matthew White Date: 07/05/2014 MRN: [id number] DOB: 12/09/1934   Current dose: 15 Gy  Current fraction: 5   MEDICATIONS: Current Outpatient Prescriptions  Medication Sig Dispense Refill  . ADULT TPN Inject into the vein continuous. [facility]      . ALPRAZolam (XANAX) 1 MG tablet Take 1 tablet (1 mg total) by mouth at bedtime as needed for anxiety.  30 tablet  0  . docusate sodium (COLACE) 100 MG capsule Take 100 mg by mouth 2 (two) times daily as needed for mild constipation.       . ENSURE (ENSURE) Take 237 mLs by mouth 4 (four) times daily as needed (for supplement.  Will use Boost or Ensure.).       Marland Kitchen HUMULIN R 100 UNIT/ML injection       . ibuprofen (ADVIL,MOTRIN) 200 MG tablet Take 200 mg by mouth 2 (two) times daily as needed for mild pain.       Marland Kitchen insulin glargine (LANTUS) 100 UNIT/ML injection Inject 10 Units into the skin at bedtime.      . insulin lispro (HUMALOG) 100 UNIT/ML injection Inject 0-8 Units into the skin every evening. Patient uses a sliding scale from 0-8 units      . lactose free nutrition (BOOST) LIQD Take 237 mLs by mouth 3 (three) times daily between meals.      . lidocaine-prilocaine (EMLA) cream Apply 1 application topically as needed (for port).       Marland Kitchen loperamide (IMODIUM A-D) 2 MG tablet Take 2 mg by mouth 4 (four) times daily as needed for diarrhea or loose stools.      . mirtazapine (REMERON) 15 MG tablet Take 1 tablet (15 mg total) by mouth at bedtime.  30 tablet  2  . Omega-3 Fatty Acids (FISH OIL PO) Take 1 capsule by mouth 2 (two) times daily.      Marland Kitchen omeprazole (PRILOSEC) 20 MG capsule Take 20 mg by mouth 2 (two) times daily before a meal.       . OxyCODONE (OXYCONTIN) 20 mg T12A 12 hr tablet Take 1 tablet (20 mg total) by mouth every 12 (twelve) hours.  60 tablet  0  . oxyCODONE-acetaminophen (PERCOCET/ROXICET) 5-325 MG  per tablet Take 2 tablets by mouth every 4 (four) hours as needed for severe pain.  100 tablet  0  . polyethylene glycol (MIRALAX / GLYCOLAX) packet Take 17 g by mouth daily as needed for mild constipation.       . prochlorperazine (COMPAZINE) 5 MG tablet Take 1 tablet (5 mg total) by mouth every 6 (six) hours as needed for nausea or vomiting.  30 tablet  2   No current facility-administered medications for this encounter.     ALLERGIES: Ivp dye   LABORATORY DATA:  Lab Results  Component Value Date   WBC 4.0 07/03/2014   HGB 9.7* 07/03/2014   HCT 30.5* 07/03/2014   MCV 107.0* 07/03/2014   PLT 257 07/03/2014   Lab Results  Component Value Date   NA 141 07/03/2014   K 4.5 07/03/2014   CL 100 06/03/2014   CO2 28 07/03/2014   Lab Results  Component Value Date   ALT 11 07/03/2014   AST 24 07/03/2014   ALKPHOS 102 07/03/2014   BILITOT 0.36 07/03/2014     NARRATIVE: Matthew White was  seen today for weekly treatment management. The chart was checked and the patient's films were reviewed. The patient states he is doing reasonably well with treatment. He stated that his pain may have improved a little bit no major change in this in the lower back. At times he has experienced may be some increase in pain over the last week as well in the lower back. He has not had any difficulties with nausea, diarrhea, or other GI issues.  PHYSICAL EXAMINATION: weight is 168 lb 3.2 oz (76.295 kg). His blood pressure is 81/45 and his pulse is 108. His respiration is 16.        ASSESSMENT: The patient is doing satisfactorily with treatment.  PLAN: We will continue with the patient's radiation treatment as planned.

## 2014-07-07 DIAGNOSIS — Z510 Encounter for antineoplastic radiation therapy: Secondary | ICD-10-CM | POA: Diagnosis not present

## 2014-07-08 ENCOUNTER — Ambulatory Visit
Admission: RE | Admit: 2014-07-08 | Discharge: 2014-07-08 | Disposition: A | Discharge status: Home / Self Care | Payer: Medicare PPO | Source: Ambulatory Visit | Attending: Radiation Oncology | Admitting: Radiation Oncology

## 2014-07-08 DIAGNOSIS — Z510 Encounter for antineoplastic radiation therapy: Secondary | ICD-10-CM | POA: Diagnosis not present

## 2014-07-09 ENCOUNTER — Other Ambulatory Visit: Payer: Self-pay | Admitting: *Deleted

## 2014-07-09 ENCOUNTER — Ambulatory Visit
Admission: RE | Admit: 2014-07-09 | Discharge: 2014-07-09 | Disposition: A | Discharge status: Home / Self Care | Payer: Medicare PPO | Source: Ambulatory Visit | Attending: Radiation Oncology | Admitting: Radiation Oncology

## 2014-07-09 DIAGNOSIS — C159 Malignant neoplasm of esophagus, unspecified: Secondary | ICD-10-CM

## 2014-07-09 DIAGNOSIS — Z510 Encounter for antineoplastic radiation therapy: Secondary | ICD-10-CM | POA: Diagnosis not present

## 2014-07-09 MED ORDER — OXYCODONE-ACETAMINOPHEN 5-325 MG PO TABS: 2.0000 | ORAL_TABLET | ORAL | Status: DC | PRN

## 2014-07-09 NOTE — Telephone Encounter (Signed)
Notified wife that script is ready. Please pick up prior to 4:30 pm

## 2014-07-09 NOTE — Telephone Encounter (Signed)
VM from wife that he will be out of oxycodone-apap tonight and she forgot to request script today when he was in for radiation. Please call when ready to pick up.

## 2014-07-10 ENCOUNTER — Ambulatory Visit
Admission: RE | Admit: 2014-07-10 | Discharge: 2014-07-10 | Disposition: A | Discharge status: Home / Self Care | Payer: Medicare PPO | Source: Ambulatory Visit | Attending: Radiation Oncology | Admitting: Radiation Oncology

## 2014-07-10 ENCOUNTER — Encounter: Payer: Self-pay | Admitting: Radiation Oncology

## 2014-07-10 ENCOUNTER — Ambulatory Visit: Payer: Medicare PPO

## 2014-07-10 ENCOUNTER — Telehealth: Payer: Self-pay | Admitting: *Deleted

## 2014-07-10 VITALS — BP 107/67 | HR 109 | Resp 16 | Wt 168.8 lb

## 2014-07-10 DIAGNOSIS — Z510 Encounter for antineoplastic radiation therapy: Secondary | ICD-10-CM | POA: Diagnosis not present

## 2014-07-10 DIAGNOSIS — C7952 Secondary malignant neoplasm of bone marrow: Secondary | ICD-10-CM

## 2014-07-10 DIAGNOSIS — R2681 Unsteadiness on feet: Secondary | ICD-10-CM

## 2014-07-10 DIAGNOSIS — C155 Malignant neoplasm of lower third of esophagus: Secondary | ICD-10-CM

## 2014-07-10 DIAGNOSIS — C7951 Secondary malignant neoplasm of bone: Secondary | ICD-10-CM

## 2014-07-10 NOTE — Progress Notes (Signed)
  Radiation Oncology         [phone] ________________________________  Name: Matthew White MRN: [id number]  Date: 07/10/2014  DOB: [birth year]  Weekly Radiation Therapy Management  Current Dose: 24 Gy     Planned Dose:  30 Gy  Narrative . . . . . . . . The patient presents for routine under treatment assessment.                                   His pain is improved.                                 Set-up films were reviewed.                                 The chart was checked. Physical Findings. . .  weight is 168 lb 12.8 oz (76.567 kg). His blood pressure is 107/67 and his pulse is 109. His respiration is 16. . Weight essentially stable.  No significant changes. Impression . . . . . . . The patient is tolerating radiation. Plan . . . . . . . . . . . . Continue treatment as planned.  ________________________________  Sheral Apley. Tammi Klippel, M.D.

## 2014-07-10 NOTE — Progress Notes (Signed)
Weight and vitals stable. Wife disconnected TPN this morning. Edema noted right ankle and left arm. Reports right lower leg numbness continues. Reports low back pain 2 on a scale of 0-10. Patient reports this pain typically resolves after second dose of pain medication each day. Reports drinking two boost per day. Denies diarrhea or vomiting. Reports occasional nausea resolves with compazine. Reports mild fatigue. Wife reports the patient is sleeping more. Reports there are no new pressure sores on his bottom and the old ones are almost completely healed. Walks at home with aid of walker and can take 4-5 step independently. Wife reports he continues to drag both feet. On break from chemo this week. Continues to have a poor appetite.

## 2014-07-10 NOTE — Telephone Encounter (Signed)
Called patient to inform of PT appt. For 07-11-14- @ 1 pm @ [place] , spoke with patient's wife and she is aware of this appt.

## 2014-07-11 ENCOUNTER — Ambulatory Visit
Admission: RE | Admit: 2014-07-11 | Discharge: 2014-07-11 | Disposition: A | Discharge status: Home / Self Care | Payer: Medicare PPO | Source: Ambulatory Visit | Attending: Radiation Oncology | Admitting: Radiation Oncology

## 2014-07-11 ENCOUNTER — Ambulatory Visit: Payer: Medicare PPO | Attending: Radiation Oncology | Admitting: Physical Therapy

## 2014-07-11 DIAGNOSIS — Z9089 Acquired absence of other organs: Secondary | ICD-10-CM | POA: Insufficient documentation

## 2014-07-11 DIAGNOSIS — R5381 Other malaise: Secondary | ICD-10-CM | POA: Insufficient documentation

## 2014-07-11 DIAGNOSIS — R262 Difficulty in walking, not elsewhere classified: Secondary | ICD-10-CM | POA: Insufficient documentation

## 2014-07-11 DIAGNOSIS — Z923 Personal history of irradiation: Secondary | ICD-10-CM | POA: Diagnosis not present

## 2014-07-11 DIAGNOSIS — Z510 Encounter for antineoplastic radiation therapy: Secondary | ICD-10-CM | POA: Diagnosis not present

## 2014-07-11 DIAGNOSIS — C159 Malignant neoplasm of esophagus, unspecified: Secondary | ICD-10-CM | POA: Diagnosis not present

## 2014-07-11 DIAGNOSIS — C7952 Secondary malignant neoplasm of bone marrow: Secondary | ICD-10-CM

## 2014-07-11 DIAGNOSIS — C7951 Secondary malignant neoplasm of bone: Secondary | ICD-10-CM | POA: Insufficient documentation

## 2014-07-11 DIAGNOSIS — IMO0001 Reserved for inherently not codable concepts without codable children: Secondary | ICD-10-CM | POA: Diagnosis present

## 2014-07-11 DIAGNOSIS — Z87311 Personal history of (healed) other pathological fracture: Secondary | ICD-10-CM | POA: Diagnosis not present

## 2014-07-12 ENCOUNTER — Encounter: Payer: Self-pay | Admitting: Radiation Oncology

## 2014-07-12 ENCOUNTER — Ambulatory Visit
Admission: RE | Admit: 2014-07-12 | Discharge: 2014-07-12 | Disposition: A | Discharge status: Home / Self Care | Payer: Medicare PPO | Source: Ambulatory Visit | Attending: Radiation Oncology | Admitting: Radiation Oncology

## 2014-07-12 DIAGNOSIS — Z510 Encounter for antineoplastic radiation therapy: Secondary | ICD-10-CM | POA: Diagnosis not present

## 2014-07-14 ENCOUNTER — Other Ambulatory Visit: Payer: Self-pay | Admitting: Oncology

## 2014-07-14 NOTE — Progress Notes (Signed)
  Radiation Oncology         [phone] ________________________________  Name: BRAD LIEURANCE MRN: [id number]  Date: 07/12/2014  DOB: 1934/09/07  End of Treatment Note  Diagnosis:   80 yo man with new painful lumbar and sacral spine metastases from the lower third of esophagus cancer   Indication for treatment:  Palliation of pain, prevention of neurologic injury      Radiation treatment dates:  07/01/2014-07/12/2014  Site/dose:   The spine from L1 through the SI Joints, inclusive, was treated to 30 Gy in 10 fractions.  Beams/energy:   15 MV X-rays were applied to anterior and posterior radiation fields shaped around the targeted area while excluding the kidneys and uninvolved bowel.  Narrative: The patient tolerated radiation treatment relatively well.   His pain improved some in the radiation field.  He had ongoing hip pain.  Plan: The patient has completed radiation treatment. The patient will return to radiation oncology clinic for routine followup in one month. I advised them to call or return sooner if they have any questions or concerns related to their recovery or treatment. ________________________________  Sheral Apley. Tammi Klippel, M.D.

## 2014-07-17 ENCOUNTER — Ambulatory Visit (HOSPITAL_BASED_OUTPATIENT_CLINIC_OR_DEPARTMENT_OTHER): Payer: Medicare PPO

## 2014-07-17 ENCOUNTER — Telehealth: Payer: Self-pay | Admitting: Oncology

## 2014-07-17 ENCOUNTER — Other Ambulatory Visit: Payer: Self-pay | Admitting: Nurse Practitioner

## 2014-07-17 ENCOUNTER — Ambulatory Visit (HOSPITAL_BASED_OUTPATIENT_CLINIC_OR_DEPARTMENT_OTHER): Payer: Medicare PPO | Admitting: Oncology

## 2014-07-17 ENCOUNTER — Encounter: Payer: Self-pay | Admitting: *Deleted

## 2014-07-17 ENCOUNTER — Telehealth: Payer: Self-pay | Admitting: Radiation Oncology

## 2014-07-17 ENCOUNTER — Other Ambulatory Visit (HOSPITAL_BASED_OUTPATIENT_CLINIC_OR_DEPARTMENT_OTHER): Payer: Medicare PPO

## 2014-07-17 ENCOUNTER — Ambulatory Visit: Payer: Medicare PPO | Attending: Radiation Oncology | Admitting: Physical Therapy

## 2014-07-17 VITALS — BP 80/42 | HR 115 | Temp 98.9°F | Resp 19 | Ht 73.0 in | Wt 162.8 lb

## 2014-07-17 VITALS — BP 88/50

## 2014-07-17 DIAGNOSIS — Z9089 Acquired absence of other organs: Secondary | ICD-10-CM | POA: Insufficient documentation

## 2014-07-17 DIAGNOSIS — C159 Malignant neoplasm of esophagus, unspecified: Secondary | ICD-10-CM | POA: Insufficient documentation

## 2014-07-17 DIAGNOSIS — C7951 Secondary malignant neoplasm of bone: Secondary | ICD-10-CM

## 2014-07-17 DIAGNOSIS — C7952 Secondary malignant neoplasm of bone marrow: Secondary | ICD-10-CM

## 2014-07-17 DIAGNOSIS — C155 Malignant neoplasm of lower third of esophagus: Secondary | ICD-10-CM

## 2014-07-17 DIAGNOSIS — Z5111 Encounter for antineoplastic chemotherapy: Secondary | ICD-10-CM

## 2014-07-17 DIAGNOSIS — Z923 Personal history of irradiation: Secondary | ICD-10-CM | POA: Insufficient documentation

## 2014-07-17 DIAGNOSIS — IMO0001 Reserved for inherently not codable concepts without codable children: Secondary | ICD-10-CM | POA: Insufficient documentation

## 2014-07-17 DIAGNOSIS — C782 Secondary malignant neoplasm of pleura: Secondary | ICD-10-CM

## 2014-07-17 DIAGNOSIS — Z87311 Personal history of (healed) other pathological fracture: Secondary | ICD-10-CM | POA: Insufficient documentation

## 2014-07-17 DIAGNOSIS — M84551D Pathological fracture in neoplastic disease, right femur, subsequent encounter for fracture with routine healing: Principal | ICD-10-CM

## 2014-07-17 DIAGNOSIS — R262 Difficulty in walking, not elsewhere classified: Secondary | ICD-10-CM | POA: Insufficient documentation

## 2014-07-17 DIAGNOSIS — R5381 Other malaise: Secondary | ICD-10-CM | POA: Insufficient documentation

## 2014-07-17 DIAGNOSIS — D489 Neoplasm of uncertain behavior, unspecified: Secondary | ICD-10-CM

## 2014-07-17 LAB — CBC WITH DIFFERENTIAL/PLATELET
BASO%: 0.3 % (ref 0.0–2.0)
Basophils Absolute: 0 10*3/uL (ref 0.0–0.1)
EOS ABS: 0.1 10*3/uL (ref 0.0–0.5)
EOS%: 1.3 % (ref 0.0–7.0)
HCT: 28.6 % — ABNORMAL LOW (ref 38.4–49.9)
HGB: 9.3 g/dL — ABNORMAL LOW (ref 13.0–17.1)
LYMPH%: 15.7 % (ref 14.0–49.0)
MCH: 34.6 pg — ABNORMAL HIGH (ref 27.2–33.4)
MCHC: 32.5 g/dL (ref 32.0–36.0)
MCV: 106.3 fL — ABNORMAL HIGH (ref 79.3–98.0)
MONO#: 1.1 10*3/uL — AB (ref 0.1–0.9)
MONO%: 29.9 % — ABNORMAL HIGH (ref 0.0–14.0)
NEUT%: 52.8 % (ref 39.0–75.0)
NEUTROS ABS: 2 10*3/uL (ref 1.5–6.5)
PLATELETS: 174 10*3/uL (ref 140–400)
RBC: 2.69 10*6/uL — ABNORMAL LOW (ref 4.20–5.82)
RDW: 14.7 % — ABNORMAL HIGH (ref 11.0–14.6)
WBC: 3.8 10*3/uL — AB (ref 4.0–10.3)
lymph#: 0.6 10*3/uL — ABNORMAL LOW (ref 0.9–3.3)

## 2014-07-17 LAB — COMPREHENSIVE METABOLIC PANEL (CC13)
ALBUMIN: 2.5 g/dL — AB (ref 3.5–5.0)
ALT: 8 U/L (ref 0–55)
ANION GAP: 9 meq/L (ref 3–11)
AST: 14 U/L (ref 5–34)
Alkaline Phosphatase: 117 U/L (ref 40–150)
BILIRUBIN TOTAL: 0.51 mg/dL (ref 0.20–1.20)
BUN: 20.9 mg/dL (ref 7.0–26.0)
CALCIUM: 9 mg/dL (ref 8.4–10.4)
CHLORIDE: 107 meq/L (ref 98–109)
CO2: 26 mEq/L (ref 22–29)
Creatinine: 0.7 mg/dL (ref 0.7–1.3)
GLUCOSE: 142 mg/dL — AB (ref 70–140)
POTASSIUM: 4 meq/L (ref 3.5–5.1)
SODIUM: 141 meq/L (ref 136–145)
TOTAL PROTEIN: 6 g/dL — AB (ref 6.4–8.3)

## 2014-07-17 LAB — PHOSPHORUS: PHOSPHORUS: 2.2 mg/dL — AB (ref 2.3–4.6)

## 2014-07-17 LAB — MAGNESIUM (CC13): Magnesium: 1.6 mg/dl (ref 1.5–2.5)

## 2014-07-17 MED ORDER — SODIUM CHLORIDE 0.9 % IV SOLN
Freq: Once | INTRAVENOUS | Status: AC
  Administered 2014-07-17: 13:00:00 via INTRAVENOUS

## 2014-07-17 MED ORDER — DOCETAXEL CHEMO INJECTION 160 MG/16ML
20.0000 mg/m2 | Freq: Once | INTRAVENOUS | Status: AC
  Administered 2014-07-17: 40 mg via INTRAVENOUS
  Filled 2014-07-17: qty 4

## 2014-07-17 MED ORDER — DEXAMETHASONE SODIUM PHOSPHATE 10 MG/ML IJ SOLN
10.0000 mg | Freq: Once | INTRAMUSCULAR | Status: AC
  Administered 2014-07-17: 10 mg via INTRAVENOUS

## 2014-07-17 MED ORDER — SODIUM CHLORIDE 0.9 % IV SOLN
500.0000 mL | Freq: Once | INTRAVENOUS | Status: AC
  Administered 2014-07-17: 500 mL via INTRAVENOUS

## 2014-07-17 MED ORDER — HEPARIN SOD (PORK) LOCK FLUSH 100 UNIT/ML IV SOLN
500.0000 [IU] | Freq: Once | INTRAVENOUS | Status: AC | PRN
  Administered 2014-07-17: 500 [IU]
  Filled 2014-07-17: qty 5

## 2014-07-17 MED ORDER — SODIUM CHLORIDE 0.9 % IJ SOLN
10.0000 mL | INTRAMUSCULAR | Status: DC | PRN
  Administered 2014-07-17: 10 mL
  Filled 2014-07-17: qty 10

## 2014-07-17 MED ORDER — ONDANSETRON 8 MG/50ML IVPB (CHCC)
8.0000 mg | Freq: Once | INTRAVENOUS | Status: AC
  Administered 2014-07-17: 8 mg via INTRAVENOUS

## 2014-07-17 MED ORDER — ONDANSETRON 8 MG/NS 50 ML IVPB
INTRAVENOUS | Status: AC
  Filled 2014-07-17: qty 8

## 2014-07-17 MED ORDER — DEXAMETHASONE SODIUM PHOSPHATE 10 MG/ML IJ SOLN
INTRAMUSCULAR | Status: AC
  Filled 2014-07-17: qty 1

## 2014-07-17 NOTE — Telephone Encounter (Signed)
gv adn printed appt sched and avs for pt for Aug and SEpt....sed added tx.

## 2014-07-17 NOTE — Patient Instructions (Signed)
[facility] Discharge Instructions for Patients Receiving Chemotherapy  Today you received the following chemotherapy agents Taxotere.  To help prevent nausea and vomiting after your treatment, we encourage you to take your nausea medication as prescribed.   If you develop nausea and vomiting that is not controlled by your nausea medication, call the clinic.   BELOW ARE SYMPTOMS THAT SHOULD BE REPORTED IMMEDIATELY:  *FEVER GREATER THAN 100.5 F  *CHILLS WITH OR WITHOUT FEVER  NAUSEA AND VOMITING THAT IS NOT CONTROLLED WITH YOUR NAUSEA MEDICATION  *UNUSUAL SHORTNESS OF BREATH  *UNUSUAL BRUISING OR BLEEDING  TENDERNESS IN MOUTH AND THROAT WITH OR WITHOUT PRESENCE OF ULCERS  *URINARY PROBLEMS  *BOWEL PROBLEMS  UNUSUAL RASH Items with * indicate a potential emergency and should be followed up as soon as possible.  Feel free to call the clinic you have any questions or concerns. The clinic phone number is [phone].

## 2014-07-17 NOTE — Telephone Encounter (Signed)
Placed physical therapy orders in Dr. Johny Shears inbox to sign. Will fax to outpatient rehab once he returns signed form to this Probation officer.

## 2014-07-17 NOTE — Progress Notes (Signed)
Matthew White   Diagnosis: esophagus cancer  INTERVAL HISTORY:   Matthew White returns as scheduled. He has completed 3 treatments with Taxotere. He was referred to Matthew White after a CT confirmed a large lesion at the right sacrum, likely responsible for the right leg pain.he completed treatment from L1 through the sacroiliac joints on 07/12/2014.  The right leg pain has improved, but she continues to have right leg and foot weakness. He took a trip to the beach last weekend. The Port-A-Cath needle became dislodged and he has missed  TPN for the past 2 days. He is able to drink liquids. Objective:  Vital signs in last 24 hours:  Blood pressure 80/42, pulse 115, temperature 98.9 F (37.2 C), temperature source Oral, resp. rate 19, height _0  (1.854 m), weight 162 lb 12.8 oz (73.846 kg), SpO2 98.00%.    HEENT: no thrush, small ulceration at the lower anterior gum Lymphatics: 1.5 cm mobile right supraclavicular node Resp: lungs clear bilaterally Cardio: regular rate and rhythm GI: no hepatomegaly, nontender Vascular: no leg edema Neuro:decreased strength with dorsi flexion at the right foot and flexion at the right hip    Portacath/PICC-without erythema  Lab Results:  Lab Results  Component Value Date   WBC 3.8* 07/17/2014   HGB 9.3* 07/17/2014   HCT 28.6* 07/17/2014   MCV 106.3* 07/17/2014   PLT 174 07/17/2014   NEUTROABS 2.0 07/17/2014   Medications: I have reviewed the patient's current medications.  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  -Status post an esophagogastrectomy 04/20/2013 with the pathology confirming a pT1b,pN1 tumor, grade 1 with negative surgical margins.  2. History of Barrett's esophagus.  3. Adult onset  diabetes.  4. Odynophagia/early satiety. The odynophagia is better.  5. Postoperative hoarseness-status post collagen injection therapy at [facility].  6. Dysphagia progressive over the past 2-4 months. He is sheduled for a dilatation procedure in the near future.  7. Chest CT 02/14/2014 showed a new masslike partially calcified lesion in the right lower lobe peripherally. There were some new lower lobe reticulonodular opacities. A peripherally located right lower lobe superior segment nodule was larger.  PET scan 02/19/2014 with evidence of multiple hypermetabolic lung nodules, hypermetabolic bone lesions, and a hypermetabolic focus at the left side of the prostate  CT biopsy of a right lower lobe subpleural nodule 03/07/2014 confirmed metastatic adenocarcinoma consistent with esophagus cancer. HER-2 negative.  Cycle 1 CAPOX beginning 03/18/2014.  Cycle 2 CAPOX beginning 04/08/2014.  Cycle 3 CAPOX beginning 04/29/2014.  Restaging chest CT 05/17/2014 with progression of multiple bone metastases including a nearly 4 cm lesion in the T2 vertebral body essentially destroying the vertebral body and penetrating through the posterior cortex into the anterior spinal canal. Another large lesion seen in the left clavicular head. Other scattered smaller rib and spinal lesions identified. Multiple masslike opacities scattered in the lungs. Some lesions progressing in the interval and others decreasing.  He completed palliative radiation to the left clavicle, T2, and right femur.  Initiation of Taxotere 06/19/2014 weekly x3 followed by a one-week break. Palliative radiation, L1 through the sacroiliac joints completed 07/12/2014 8. Port-A-Cath placement 03/11/2014  9. Severe pain at the left clavicle after a fall, plain x-ray of the left clavicle 03/18/2014 confirmed a lytic lesion of the medial left clavicle, PET scan at  [facility] 02/19/2014 revealed a hypermetabolic medial left clavicle lesion.  10. History  ofConstipation-likely secondary to narcotics.  11. Delayed nausea following cycle 2 CAPOX. Aloxi added beginning with cycle 3.  12. Impending pathologic fracture right femur status post rod placement 05/03/2014.  13. facial MRSA abscess status post incision and drainage procedure 05/30/2014  14. Malnutrition-now maintained on TNA with weight gain.  15. Elevated BUN. Question related to TPN. Improved    Disposition:  Matthew White has completed one cycle of weekly Taxotere. He appears to be tolerating the chemotherapy well. The right leg pain has improved after a course of palliative radiation. He continues to have right leg weakness. He appears to have a slightly improved performance status. We decided to proceed with a second three-week cycle of Taxotere today. He will return for an office visit on 08/07/2014. He will be scheduled for a restaging chest CT in early September. Matthew White continues TPN.  Betsy Coder, MD  07/17/2014  3:45 PM

## 2014-07-18 ENCOUNTER — Ambulatory Visit: Payer: Medicare PPO | Admitting: Radiation Oncology

## 2014-07-18 ENCOUNTER — Telehealth: Payer: Self-pay | Admitting: Oncology

## 2014-07-18 MED ORDER — ALPRAZOLAM 1 MG PO TABS: 1.0000 mg | ORAL_TABLET | Freq: Every evening | ORAL | Status: DC | PRN

## 2014-07-18 NOTE — Telephone Encounter (Signed)
s.w. pt wife and advised on Aug appt...ok and aware

## 2014-07-19 ENCOUNTER — Telehealth: Payer: Self-pay | Admitting: *Deleted

## 2014-07-19 NOTE — Telephone Encounter (Signed)
Message copied by Wardell Heath on Fri Jul 19, 2014  3:16 PM ------      Message from: Owens Shark      Created: Fri Jul 19, 2014  2:25 PM       Please fax lab results to the home care agency/pharmacy managing TPN.      ----- Message -----         From: [facility] in [facility] Interface         Sent: 07/17/2014  10:56 AM           To: Owens Shark, NP                   ------

## 2014-07-19 NOTE — Telephone Encounter (Signed)
Faxed lab results to [facility] [phone].  Per Elby Showers. Marcello Moores

## 2014-07-21 ENCOUNTER — Other Ambulatory Visit: Payer: Self-pay | Admitting: Oncology

## 2014-07-22 ENCOUNTER — Telehealth: Payer: Self-pay | Admitting: Radiation Oncology

## 2014-07-22 NOTE — Telephone Encounter (Signed)
Faxed signed INITIAL SUMMARY FOR PHYSICAL THERAPY SERVICES to [facility]. Confirmation fax of delivery obtained.

## 2014-07-23 ENCOUNTER — Ambulatory Visit: Payer: Medicare PPO | Admitting: Physical Therapy

## 2014-07-23 DIAGNOSIS — IMO0001 Reserved for inherently not codable concepts without codable children: Secondary | ICD-10-CM | POA: Diagnosis not present

## 2014-07-23 DIAGNOSIS — C159 Malignant neoplasm of esophagus, unspecified: Secondary | ICD-10-CM | POA: Diagnosis not present

## 2014-07-23 DIAGNOSIS — Z9089 Acquired absence of other organs: Secondary | ICD-10-CM | POA: Diagnosis not present

## 2014-07-23 DIAGNOSIS — Z87311 Personal history of (healed) other pathological fracture: Secondary | ICD-10-CM | POA: Diagnosis not present

## 2014-07-23 DIAGNOSIS — R5381 Other malaise: Secondary | ICD-10-CM | POA: Diagnosis not present

## 2014-07-23 DIAGNOSIS — C7951 Secondary malignant neoplasm of bone: Secondary | ICD-10-CM | POA: Diagnosis not present

## 2014-07-23 DIAGNOSIS — Z923 Personal history of irradiation: Secondary | ICD-10-CM | POA: Diagnosis not present

## 2014-07-23 DIAGNOSIS — R262 Difficulty in walking, not elsewhere classified: Secondary | ICD-10-CM | POA: Diagnosis not present

## 2014-07-24 ENCOUNTER — Ambulatory Visit (HOSPITAL_COMMUNITY)
Admission: RE | Admit: 2014-07-24 | Discharge: 2014-07-24 | Disposition: A | Discharge status: Home / Self Care | Payer: Medicare PPO | Source: Ambulatory Visit | Attending: Hematology and Oncology | Admitting: Hematology and Oncology

## 2014-07-24 ENCOUNTER — Other Ambulatory Visit (HOSPITAL_BASED_OUTPATIENT_CLINIC_OR_DEPARTMENT_OTHER): Payer: Medicare PPO

## 2014-07-24 ENCOUNTER — Ambulatory Visit: Payer: Medicare PPO | Admitting: Physical Therapy

## 2014-07-24 ENCOUNTER — Ambulatory Visit (HOSPITAL_BASED_OUTPATIENT_CLINIC_OR_DEPARTMENT_OTHER): Payer: Medicare PPO

## 2014-07-24 VITALS — BP 83/48 | HR 99 | Temp 98.3°F

## 2014-07-24 DIAGNOSIS — C155 Malignant neoplasm of lower third of esophagus: Secondary | ICD-10-CM | POA: Diagnosis not present

## 2014-07-24 DIAGNOSIS — C7952 Secondary malignant neoplasm of bone marrow: Secondary | ICD-10-CM

## 2014-07-24 DIAGNOSIS — C7951 Secondary malignant neoplasm of bone: Secondary | ICD-10-CM | POA: Diagnosis not present

## 2014-07-24 DIAGNOSIS — E46 Unspecified protein-calorie malnutrition: Secondary | ICD-10-CM

## 2014-07-24 DIAGNOSIS — D489 Neoplasm of uncertain behavior, unspecified: Secondary | ICD-10-CM

## 2014-07-24 DIAGNOSIS — Z5111 Encounter for antineoplastic chemotherapy: Secondary | ICD-10-CM

## 2014-07-24 DIAGNOSIS — M84551D Pathological fracture in neoplastic disease, right femur, subsequent encounter for fracture with routine healing: Secondary | ICD-10-CM

## 2014-07-24 DIAGNOSIS — C782 Secondary malignant neoplasm of pleura: Secondary | ICD-10-CM

## 2014-07-24 DIAGNOSIS — IMO0001 Reserved for inherently not codable concepts without codable children: Secondary | ICD-10-CM | POA: Diagnosis not present

## 2014-07-24 LAB — URINALYSIS, MICROSCOPIC - CHCC
Bilirubin (Urine): NEGATIVE
Blood: NEGATIVE
Glucose: NEGATIVE mg/dL
KETONES: NEGATIVE mg/dL
LEUKOCYTE ESTERASE: NEGATIVE
Nitrite: NEGATIVE
PH: 6 (ref 4.6–8.0)
Protein: NEGATIVE mg/dL
Specific Gravity, Urine: 1.015 (ref 1.003–1.035)
Urobilinogen, UR: 0.2 mg/dL (ref 0.2–1)

## 2014-07-24 LAB — COMPREHENSIVE METABOLIC PANEL
ALK PHOS: 139 U/L — AB (ref 39–117)
ALT: 9 U/L (ref 0–53)
AST: 26 U/L (ref 0–37)
Albumin: 2.3 g/dL — ABNORMAL LOW (ref 3.5–5.2)
BILIRUBIN TOTAL: 0.3 mg/dL (ref 0.2–1.2)
BUN: 47 mg/dL — AB (ref 6–23)
CO2: 25 mEq/L (ref 19–32)
CREATININE: 0.75 mg/dL (ref 0.50–1.35)
Calcium: 9.2 mg/dL (ref 8.4–10.5)
Chloride: 100 mEq/L (ref 96–112)
Glucose, Bld: 147 mg/dL — ABNORMAL HIGH (ref 70–99)
Potassium: 5.4 mEq/L — ABNORMAL HIGH (ref 3.5–5.3)
Sodium: 136 mEq/L (ref 135–145)
Total Protein: 6 g/dL (ref 6.0–8.3)

## 2014-07-24 LAB — CBC WITH DIFFERENTIAL/PLATELET
BASO%: 0.3 % (ref 0.0–2.0)
Basophils Absolute: 0 10*3/uL (ref 0.0–0.1)
EOS%: 2.8 % (ref 0.0–7.0)
Eosinophils Absolute: 0.1 10*3/uL (ref 0.0–0.5)
HCT: 27.2 % — ABNORMAL LOW (ref 38.4–49.9)
HEMOGLOBIN: 8.9 g/dL — AB (ref 13.0–17.1)
LYMPH%: 7.7 % — AB (ref 14.0–49.0)
MCH: 34.1 pg — AB (ref 27.2–33.4)
MCHC: 32.6 g/dL (ref 32.0–36.0)
MCV: 104.6 fL — AB (ref 79.3–98.0)
MONO#: 1.1 10*3/uL — ABNORMAL HIGH (ref 0.1–0.9)
MONO%: 21.6 % — ABNORMAL HIGH (ref 0.0–14.0)
NEUT#: 3.5 10*3/uL (ref 1.5–6.5)
NEUT%: 67.6 % (ref 39.0–75.0)
PLATELETS: 183 10*3/uL (ref 140–400)
RBC: 2.6 10*6/uL — ABNORMAL LOW (ref 4.20–5.82)
RDW: 14.8 % — AB (ref 11.0–14.6)
WBC: 5.1 10*3/uL (ref 4.0–10.3)
lymph#: 0.4 10*3/uL — ABNORMAL LOW (ref 0.9–3.3)

## 2014-07-24 LAB — TECHNOLOGIST REVIEW

## 2014-07-24 MED ORDER — SODIUM CHLORIDE 0.9 % IJ SOLN
10.0000 mL | INTRAMUSCULAR | Status: DC | PRN
  Administered 2014-07-24: 10 mL
  Filled 2014-07-24: qty 10

## 2014-07-24 MED ORDER — DOCETAXEL CHEMO INJECTION 160 MG/16ML
20.0000 mg/m2 | Freq: Once | INTRAVENOUS | Status: AC
  Administered 2014-07-24: 40 mg via INTRAVENOUS
  Filled 2014-07-24: qty 4

## 2014-07-24 MED ORDER — SODIUM CHLORIDE 0.9 % IV SOLN
Freq: Once | INTRAVENOUS | Status: AC
  Administered 2014-07-24: 15:00:00 via INTRAVENOUS

## 2014-07-24 MED ORDER — ONDANSETRON 8 MG/50ML IVPB (CHCC)
8.0000 mg | Freq: Once | INTRAVENOUS | Status: AC
  Administered 2014-07-24: 8 mg via INTRAVENOUS

## 2014-07-24 MED ORDER — DEXAMETHASONE SODIUM PHOSPHATE 10 MG/ML IJ SOLN
10.0000 mg | Freq: Once | INTRAMUSCULAR | Status: AC
  Administered 2014-07-24: 10 mg via INTRAVENOUS

## 2014-07-24 MED ORDER — DEXAMETHASONE SODIUM PHOSPHATE 10 MG/ML IJ SOLN
INTRAMUSCULAR | Status: AC
  Filled 2014-07-24: qty 1

## 2014-07-24 MED ORDER — ONDANSETRON 8 MG/NS 50 ML IVPB
INTRAVENOUS | Status: AC
  Filled 2014-07-24: qty 8

## 2014-07-24 MED ORDER — HEPARIN SOD (PORK) LOCK FLUSH 100 UNIT/ML IV SOLN
500.0000 [IU] | Freq: Once | INTRAVENOUS | Status: AC | PRN
  Administered 2014-07-24: 500 [IU]
  Filled 2014-07-24: qty 5

## 2014-07-24 NOTE — Patient Instructions (Signed)
[facility] Discharge Instructions for Patients Receiving Chemotherapy  Today you received the following chemotherapy agents taxotere   To help prevent nausea and vomiting after your treatment, we encourage you to take your nausea medication as directed   If you develop nausea and vomiting that is not controlled by your nausea medication, call the clinic.   BELOW ARE SYMPTOMS THAT SHOULD BE REPORTED IMMEDIATELY:  *FEVER GREATER THAN 100.5 F  *CHILLS WITH OR WITHOUT FEVER  NAUSEA AND VOMITING THAT IS NOT CONTROLLED WITH YOUR NAUSEA MEDICATION  *UNUSUAL SHORTNESS OF BREATH  *UNUSUAL BRUISING OR BLEEDING  TENDERNESS IN MOUTH AND THROAT WITH OR WITHOUT PRESENCE OF ULCERS  *URINARY PROBLEMS  *BOWEL PROBLEMS  UNUSUAL RASH Items with * indicate a potential emergency and should be followed up as soon as possible.  Feel free to call the clinic you have any questions or concerns. The clinic phone number is [phone].  

## 2014-07-24 NOTE — Progress Notes (Signed)
Pt wife notified RN pt has had increased temp up to 102 over the weekend, but resolved.  Wife stated pt has been confused off and on since the weekend.  BP today 80's/40's.  Dr Alvy Bimler notified, instructed to obtain urinalysis and chest Xray.  Ok to continue with treatment today per Dr Alvy Bimler.

## 2014-07-25 ENCOUNTER — Encounter (HOSPITAL_COMMUNITY): Payer: Self-pay | Admitting: Emergency Medicine

## 2014-07-25 ENCOUNTER — Telehealth: Payer: Self-pay | Admitting: *Deleted

## 2014-07-25 ENCOUNTER — Emergency Department (HOSPITAL_COMMUNITY)
Admission: EM | Admit: 2014-07-25 | Discharge: 2014-07-25 | Disposition: A | Discharge status: Home / Self Care | Payer: Medicare PPO | Attending: Emergency Medicine | Admitting: Emergency Medicine

## 2014-07-25 DIAGNOSIS — Z79899 Other long term (current) drug therapy: Secondary | ICD-10-CM | POA: Insufficient documentation

## 2014-07-25 DIAGNOSIS — Z87891 Personal history of nicotine dependence: Secondary | ICD-10-CM | POA: Insufficient documentation

## 2014-07-25 DIAGNOSIS — Z9889 Other specified postprocedural states: Secondary | ICD-10-CM | POA: Diagnosis not present

## 2014-07-25 DIAGNOSIS — K219 Gastro-esophageal reflux disease without esophagitis: Secondary | ICD-10-CM | POA: Insufficient documentation

## 2014-07-25 DIAGNOSIS — R509 Fever, unspecified: Secondary | ICD-10-CM | POA: Insufficient documentation

## 2014-07-25 DIAGNOSIS — Z8709 Personal history of other diseases of the respiratory system: Secondary | ICD-10-CM | POA: Diagnosis not present

## 2014-07-25 DIAGNOSIS — D649 Anemia, unspecified: Secondary | ICD-10-CM | POA: Diagnosis not present

## 2014-07-25 DIAGNOSIS — E119 Type 2 diabetes mellitus without complications: Secondary | ICD-10-CM | POA: Insufficient documentation

## 2014-07-25 DIAGNOSIS — Z794 Long term (current) use of insulin: Secondary | ICD-10-CM | POA: Insufficient documentation

## 2014-07-25 DIAGNOSIS — C7889 Secondary malignant neoplasm of other digestive organs: Secondary | ICD-10-CM | POA: Diagnosis not present

## 2014-07-25 DIAGNOSIS — C801 Malignant (primary) neoplasm, unspecified: Secondary | ICD-10-CM | POA: Insufficient documentation

## 2014-07-25 DIAGNOSIS — C799 Secondary malignant neoplasm of unspecified site: Secondary | ICD-10-CM

## 2014-07-25 LAB — URINALYSIS, ROUTINE W REFLEX MICROSCOPIC
Bilirubin Urine: NEGATIVE
Glucose, UA: NEGATIVE mg/dL
Hgb urine dipstick: NEGATIVE
Ketones, ur: NEGATIVE mg/dL
Leukocytes, UA: NEGATIVE
Nitrite: NEGATIVE
Protein, ur: NEGATIVE mg/dL
Specific Gravity, Urine: 1.02 (ref 1.005–1.030)
Urobilinogen, UA: 0.2 mg/dL (ref 0.0–1.0)
pH: 7 (ref 5.0–8.0)

## 2014-07-25 LAB — CBC WITH DIFFERENTIAL/PLATELET
Basophils Absolute: 0 10*3/uL (ref 0.0–0.1)
Basophils Relative: 0 % (ref 0–1)
Eosinophils Absolute: 0 10*3/uL (ref 0.0–0.7)
Eosinophils Relative: 0 % (ref 0–5)
HCT: 24.2 % — ABNORMAL LOW (ref 39.0–52.0)
Hemoglobin: 7.7 g/dL — ABNORMAL LOW (ref 13.0–17.0)
Lymphocytes Relative: 11 % — ABNORMAL LOW (ref 12–46)
Lymphs Abs: 0.5 10*3/uL — ABNORMAL LOW (ref 0.7–4.0)
MCH: 33.3 pg (ref 26.0–34.0)
MCHC: 31.8 g/dL (ref 30.0–36.0)
MCV: 104.8 fL — ABNORMAL HIGH (ref 78.0–100.0)
Monocytes Absolute: 0.7 10*3/uL (ref 0.1–1.0)
Monocytes Relative: 16 % — ABNORMAL HIGH (ref 3–12)
Neutro Abs: 3.3 10*3/uL (ref 1.7–7.7)
Neutrophils Relative %: 73 % (ref 43–77)
Platelets: 165 10*3/uL (ref 150–400)
RBC: 2.31 MIL/uL — ABNORMAL LOW (ref 4.22–5.81)
RDW: 14.4 % (ref 11.5–15.5)
WBC: 4.5 10*3/uL (ref 4.0–10.5)

## 2014-07-25 LAB — IRON AND TIBC
Iron: 100 ug/dL (ref 42–135)
UIBC: <15 ug/dL — ABNORMAL LOW (ref 125–400)

## 2014-07-25 LAB — PREPARE RBC (CROSSMATCH)

## 2014-07-25 LAB — BASIC METABOLIC PANEL
Anion gap: 11 (ref 5–15)
BUN: 44 mg/dL — ABNORMAL HIGH (ref 6–23)
CO2: 27 mEq/L (ref 19–32)
Calcium: 8.9 mg/dL (ref 8.4–10.5)
Chloride: 101 mEq/L (ref 96–112)
Creatinine, Ser: 0.69 mg/dL (ref 0.50–1.35)
GFR calc Af Amer: >90 mL/min (ref >90)
GFR calc non Af Amer: 89 mL/min — ABNORMAL LOW (ref >90)
Glucose, Bld: 149 mg/dL — ABNORMAL HIGH (ref 70–99)
Potassium: 5 mEq/L (ref 3.7–5.3)
Sodium: 139 mEq/L (ref 137–147)

## 2014-07-25 LAB — FERRITIN: Ferritin: 427 ng/mL — ABNORMAL HIGH (ref 22–322)

## 2014-07-25 LAB — VITAMIN B12: Vitamin B-12: 459 pg/mL (ref 211–911)

## 2014-07-25 LAB — RETICULOCYTES
RBC.: 2.16 MIL/uL — ABNORMAL LOW (ref 4.22–5.81)
Retic Count, Absolute: 73.4 10*3/uL (ref 19.0–186.0)
Retic Ct Pct: 3.4 % — ABNORMAL HIGH (ref 0.4–3.1)

## 2014-07-25 LAB — ABO/RH: ABO/RH(D): A NEG

## 2014-07-25 LAB — FOLATE: Folate: 14.3 ng/mL

## 2014-07-25 LAB — LACTIC ACID, PLASMA: Lactic Acid, Venous: 1.7 mmol/L (ref 0.5–2.2)

## 2014-07-25 MED ORDER — HEPARIN SOD (PORK) LOCK FLUSH 100 UNIT/ML IV SOLN
500.0000 [IU] | Freq: Once | INTRAVENOUS | Status: AC
  Administered 2014-07-25: 500 [IU]
  Filled 2014-07-25: qty 5

## 2014-07-25 MED ORDER — SODIUM CHLORIDE 0.9 % IV BOLUS (SEPSIS)
1000.0000 mL | Freq: Once | INTRAVENOUS | Status: AC
  Administered 2014-07-25: 1000 mL via INTRAVENOUS

## 2014-07-25 MED ORDER — SODIUM CHLORIDE 0.9 % IV SOLN
10.0000 mL/h | Freq: Once | INTRAVENOUS | Status: AC
  Administered 2014-07-25: 10 mL/h via INTRAVENOUS

## 2014-07-25 NOTE — Telephone Encounter (Signed)
Notified wife that CXR was negative for infection, but there looks like some increase in size of tumor mass RLL lung, but this would not cause fever or hypotension. His urinalysis was unremarkable also. She reports he is still sleeping, but when he awakens she will check his BP and temp and call nurse with status update. Last night temp was in 98's. Did get up once during the night confused, looking for his glasses that were on his face. She said he has been comfortable and not short of breath. TPN continues per Dr. Keane Police orders over 14 hours/day.

## 2014-07-25 NOTE — ED Provider Notes (Signed)
CSN: [id number]     Arrival date & time 07/25/14  1246 History   First MD Initiated Contact with Patient 07/25/14 1322     Chief Complaint  Patient presents with  . Fever  . Fatigue     (Consider location/radiation/quality/duration/timing/severity/associated sxs/prior Treatment) HPI  79yM with fatigue and feeling "slow." Fever earlier in the week which has since resolved. For past couple days though just very run down/tired which out of character. No confusion but not his normal self. No acute pain complaints. Appetite he says is ok, but wife reports little oral intake. Was able to eat most of a sausage patty this morning. Hx of esophageal CA.  Does get TPN. No complaints at port site. No respiratory complaints. No urinary complaints.  Denies HA.   Past Medical History  Diagnosis Date  . Gout     no flare up in ten years   . Barrett esophagus   . Esophageal cancer 12/22/2012    invasive adenocarcinoma arising in a background of barrett's mucosa  . Diabetes mellitus without complication     type II; last A1C 6.5 10/2012  . GERD (gastroesophageal reflux disease)     managed by prilosec  . Hx of pleural effusion 10/2013    left. pt states it was drained at [facility].    Past Surgical History  Procedure Laterality Date  . Nissen fundoplication    . Tonsillectomy    . Laryngoscopy    . Esophagogastroduodenoscopy  10/18/2012    Procedure: ESOPHAGOGASTRODUODENOSCOPY (EGD);  Surgeon: Winfield Cunas., MD;  Location: [facility];  Service: Endoscopy;  Laterality: N/A;  need xray  . Savory dilation  10/18/2012    Procedure: SAVORY DILATION;  Surgeon: Winfield Cunas., MD;  Location: [facility];  Service: Endoscopy;  Laterality: N/A;  . Esophagogastroduodenoscopy  12/22/2012    Procedure: ESOPHAGOGASTRODUODENOSCOPY (EGD);  Surgeon: Winfield Cunas., MD;  Location: [facility];  Service: Endoscopy;  Laterality: N/A;  with C-Arm  . Savory dilation  12/22/2012    Procedure: SAVORY  DILATION;  Surgeon: Winfield Cunas., MD;  Location: [facility];  Service: Endoscopy;  Laterality: N/A;  . Eus  01/10/2013    Procedure: ESOPHAGEAL ENDOSCOPIC ULTRASOUND (EUS) RADIAL;  Surgeon: Arta Silence, MD;  Location: [facility];  Service: Endoscopy;  Laterality: N/A;  . Removal of breast tissue    . Esophagectomy  04/20/2013  . Larynx surgery  11/2013    LARYNGEAL REPAIR   . Cholecystectomy    . Femur im nail Right 05/03/2014    Procedure: INTRAMEDULLARY (IM) NAIL FEMORAL;  Surgeon: Alta Corning, MD;  Location: [place];  Service: Orthopedics;  Laterality: Right;   Family History  Problem Relation Age of Onset  . Cancer Father     pancreatic cancer; questionable miss dx  . Dementia Father   . Cancer Mother     cervical   History  Substance Use Topics  . Smoking status: Former Smoker    Start date: 12/13/1954    Quit date: 10/19/1983  . Smokeless tobacco: Never Used  . Alcohol Use: No     Comment: stopped drinking in 1985    Review of Systems  All systems reviewed and negative, other than as noted in HPI.   Allergies  Ivp dye  Home Medications   Prior to Admission medications   Medication Sig Start Date End Date Taking? Authorizing Provider  ADULT TPN Inject into the vein continuous. [facility] - goes  for about 14 hours   Yes Historical Provider, MD  ALPRAZolam (XANAX) 1 MG tablet Take 1 tablet (1 mg total) by mouth at bedtime as needed for sleep. 07/18/14  Yes Ladell Pier, MD  docusate sodium (COLACE) 100 MG capsule Take 100 mg by mouth 2 (two) times daily as needed for mild constipation.    Yes Historical Provider, MD  ENSURE (ENSURE) Take 237 mLs by mouth 4 (four) times daily as needed (for supplement.  Will use Boost or Ensure.).    Yes Historical Provider, MD  HUMULIN R 100 UNIT/ML injection Inject 20 Units into the skin daily. Added with the TPN 06/27/14  Yes Historical Provider, MD  ibuprofen (ADVIL,MOTRIN) 200 MG tablet Take 400 mg by mouth 2  (two) times daily as needed for mild pain.    Yes Historical Provider, MD  insulin glargine (LANTUS) 100 UNIT/ML injection Inject 10 Units into the skin at bedtime.   Yes Historical Provider, MD  insulin lispro (HUMALOG) 100 UNIT/ML injection Inject 0-8 Units into the skin daily as needed for high blood sugar. Patient uses a sliding scale from 0-8 units   Yes Historical Provider, MD  lactose free nutrition (BOOST) LIQD Take 237 mLs by mouth 3 (three) times daily between meals.   Yes Historical Provider, MD  lidocaine-prilocaine (EMLA) cream Apply 1 application topically as needed (for port).    Yes Historical Provider, MD  loperamide (IMODIUM A-D) 2 MG tablet Take 2 mg by mouth 4 (four) times daily as needed for diarrhea or loose stools.   Yes Historical Provider, MD  mirtazapine (REMERON) 15 MG tablet Take 1 tablet (15 mg total) by mouth at bedtime. 06/06/14  Yes Ladell Pier, MD  Omega-3 Fatty Acids (FISH OIL PO) Take 1 capsule by mouth 2 (two) times daily.   Yes Historical Provider, MD  omeprazole (PRILOSEC) 20 MG capsule Take 20 mg by mouth 2 (two) times daily before a meal.    Yes Historical Provider, MD  OxyCODONE (OXYCONTIN) 20 mg T12A 12 hr tablet Take 1 tablet (20 mg total) by mouth every 12 (twelve) hours. 06/19/14  Yes Owens Shark, NP  oxyCODONE-acetaminophen (PERCOCET/ROXICET) 5-325 MG per tablet Take 2 tablets by mouth every 4 (four) hours as needed for severe pain. 07/09/14  Yes Ladell Pier, MD  polyethylene glycol [facility] / GLYCOLAX) packet Take 17 g by mouth daily as needed for mild constipation.    Yes Historical Provider, MD  PRESCRIPTION MEDICATION DOCEtaxel (TAXOTERE) 40 mg in dextrose 5 % 150 mL chemo infusion 20 mg/m2  1.89 m2 (Treatment Plan Actual)  Once 07/24/2014   Yes Historical Provider, MD  prochlorperazine (COMPAZINE) 5 MG tablet Take 1 tablet (5 mg total) by mouth every 6 (six) hours as needed for nausea or vomiting. 03/12/14  Yes Ladell Pier, MD   BP 100/50   Pulse 97  Temp(Src) 97.9 F (36.6 C) (Oral)  Resp 19  Ht 6\' 1"  (1.854 m)  Wt 172 lb (78.019 kg)  BMI 22.70 kg/m2  SpO2 99% Physical Exam  Nursing note and vitals reviewed. Constitutional: He appears well-developed and well-nourished. No distress.  HENT:  Head: Normocephalic and atraumatic.  Eyes: Conjunctivae are normal. Right eye exhibits no discharge. Left eye exhibits no discharge.  Neck: Neck supple.  Cardiovascular: Regular rhythm and normal heart sounds.  Exam reveals no gallop and no friction rub.   No murmur heard. Pulmonary/Chest: Effort normal and breath sounds normal. No respiratory distress.  Abdominal: Soft. He  exhibits no distension. There is no tenderness.  Musculoskeletal: He exhibits no edema and no tenderness.  Neurological: He is alert.  Skin: Skin is warm and dry.  Psychiatric: He has a normal mood and affect. His behavior is normal. Thought content normal.    ED Course  Procedures (including critical care time) Labs Review Labs Reviewed  CBC WITH DIFFERENTIAL - Abnormal; Notable for the following:    RBC 2.31 (*)    Hemoglobin 7.7 (*)    HCT 24.2 (*)    MCV 104.8 (*)    Lymphocytes Relative 11 (*)    Monocytes Relative 16 (*)    Lymphs Abs 0.5 (*)    All other components within normal limits  BASIC METABOLIC PANEL - Abnormal; Notable for the following:    Glucose, Bld 149 (*)    BUN 44 (*)    GFR calc non Af Amer 89 (*)    All other components within normal limits  URINALYSIS, ROUTINE W REFLEX MICROSCOPIC - Abnormal; Notable for the following:    APPearance CLOUDY (*)    All other components within normal limits  IRON AND TIBC - Abnormal; Notable for the following:    UIBC <15 (*)    All other components within normal limits  FERRITIN - Abnormal; Notable for the following:    Ferritin 427 (*)    All other components within normal limits  RETICULOCYTES - Abnormal; Notable for the following:    Retic Ct Pct 3.4 (*)    RBC. 2.16 (*)    All  other components within normal limits  CULTURE, BLOOD (ROUTINE X 2)  CULTURE, BLOOD (ROUTINE X 2)  LACTIC ACID, PLASMA  VITAMIN B12  FOLATE  TYPE AND SCREEN  PREPARE RBC (CROSSMATCH)  ABO/RH    Imaging Review Dg Chest 2 View  07/25/2014   CLINICAL DATA:  Esophageal malignancy  EXAM: CHEST  2 VIEW  COMPARISON:  Chest x-ray of March 07, 2014 and CT scan of the chest of May 17, 2014  FINDINGS: There has been interval growth of the right lower lobe pleural-based mass. It now measures 3.3 cm in diameter. No other masses on the right are demonstrated. On the left the heart border is partially obscured but this is stable. The cardiac silhouette is normal in size. The pulmonary vascularity is not engorged. The mediastinal mass is not evident on today's study. The power port appliance tip lies in the midportion of the SVC. The known bony lesions are not clearly evident on today's study.  IMPRESSION: There has been interval growth of a posterior right lower lobe mass consistent with metastatic disease. Otherwise there is no significant change in the appearance of the chest since the previous study.   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 Fever earlier this week which has seemed to resolve. Afebrile in ED. No clear source. Recent UA and again today clear. Reports CXR yesterday clear. Has not respiratory complaints. He is hypotensive, but per review of records he is consistently in this range. He denies dizziness/lightheadedness. No leukopenia/neutropenia. BUN elevated, but Cr normal. Doesn't clinically appear dehydrated. Likely from TPN. Worsening anemia. May be contributing to symptoms although doesn't explain fever. Possibly marrow suppression with chemo or infectious process. Denies BRBPR or melena.  Frequent  blood draws likely contributing as well. Symptoms of fatigue, feeling foggy and H/H continuing to trend down. Will transfuse. Discussed admission with pt/wife. He would prefer to go home at this time. I think this is reasonable. Blood cultures were drawn. My suspicion for bacteremia is pretty low at this time though. He is actually quite well looking considering his underlying medical issues and worsening anemia.  Understands need to follow-up with PCP or cancer center within next week and have repeat blood work. Understands need to return to ED for symptoms as discussed or anything concerning to them.    Vitals - 1 value per visit 07/25/2014 07/24/2014 07/17/2014 07/17/2014 06/09/3150  SYSTOLIC 92 83 88 80 761  DIASTOLIC 53 48 50 42 67   Vitals - 1 value per visit 07/05/2014 07/03/2014 06/28/2014 05/19/3709 05/15/6947  SYSTOLIC 81 89 99 81 546  DIASTOLIC 45 51 51 53 86   Vitals - 1 value per visit 06/19/2014 06/06/2014 2/70/3500  SYSTOLIC 75 88 88  DIASTOLIC 52 [place]    Virgel Manifold, MD 07/31/14 2355

## 2014-07-25 NOTE — Telephone Encounter (Signed)
Wife returned call : temp today is 95 & 96.0 (she confirmed thermometer is accurate). BP lying down is 86/50. He is very lethargic. Denies pain, no cough. Voided large amount of clear urine this morning. He does have H/O MRSA abscess. Informed her I am concerned he may be septic and may need to be hospitalized. Spoke with NP/Dr. Julien Nordmann: Patient needs to go to emergency room for evaluation and treatment. Wife understands and agrees.

## 2014-07-25 NOTE — ED Notes (Signed)
Pt's Wife's phone number : [phone]

## 2014-07-25 NOTE — ED Notes (Signed)
MD at bedside. 

## 2014-07-25 NOTE — ED Notes (Signed)
Will get urine sample, after MD is finished with patient

## 2014-07-25 NOTE — ED Notes (Signed)
Pt from home.  [place] pt of Dr. Benay Spice.  Pt had a fever of 102 on Saturday.  States that they have been managing his fever at home.  Earlier this week, temp was low around 94.  Wife states that he is normally "sharp as a tack" but he is forgetting things and is very lethargic.  Good urine output.  Had a UA and CXR which was neg.  Pt states that he feels like his "brain is melting", meaning that he is being forgetful and tired.

## 2014-07-26 LAB — TYPE AND SCREEN
ABO/RH(D): A NEG
Antibody Screen: NEGATIVE
Unit division: 0

## 2014-07-28 ENCOUNTER — Other Ambulatory Visit: Payer: Self-pay | Admitting: Oncology

## 2014-07-29 ENCOUNTER — Other Ambulatory Visit: Payer: Self-pay | Admitting: *Deleted

## 2014-07-29 DIAGNOSIS — C159 Malignant neoplasm of esophagus, unspecified: Secondary | ICD-10-CM

## 2014-07-29 MED ORDER — OXYCODONE HCL ER 20 MG PO T12A: 20.0000 mg | EXTENDED_RELEASE_TABLET | Freq: Two times a day (BID) | ORAL | Status: AC

## 2014-07-29 MED ORDER — OXYCODONE-ACETAMINOPHEN 5-325 MG PO TABS: 2.0000 | ORAL_TABLET | ORAL | Status: DC | PRN

## 2014-07-29 NOTE — Telephone Encounter (Signed)
Patient's wife called and requested pain medication refill. Rx will be left in rx book for pick up

## 2014-07-30 ENCOUNTER — Ambulatory Visit: Payer: Medicare PPO | Admitting: Physical Therapy

## 2014-07-30 DIAGNOSIS — IMO0001 Reserved for inherently not codable concepts without codable children: Secondary | ICD-10-CM | POA: Diagnosis not present

## 2014-07-31 ENCOUNTER — Ambulatory Visit (HOSPITAL_BASED_OUTPATIENT_CLINIC_OR_DEPARTMENT_OTHER): Payer: Medicare PPO

## 2014-07-31 ENCOUNTER — Other Ambulatory Visit (HOSPITAL_BASED_OUTPATIENT_CLINIC_OR_DEPARTMENT_OTHER): Payer: Medicare PPO

## 2014-07-31 VITALS — BP 88/53 | HR 96 | Temp 98.1°F

## 2014-07-31 DIAGNOSIS — C782 Secondary malignant neoplasm of pleura: Secondary | ICD-10-CM

## 2014-07-31 DIAGNOSIS — M84551D Pathological fracture in neoplastic disease, right femur, subsequent encounter for fracture with routine healing: Principal | ICD-10-CM

## 2014-07-31 DIAGNOSIS — C155 Malignant neoplasm of lower third of esophagus: Secondary | ICD-10-CM

## 2014-07-31 DIAGNOSIS — D489 Neoplasm of uncertain behavior, unspecified: Secondary | ICD-10-CM

## 2014-07-31 DIAGNOSIS — Z5111 Encounter for antineoplastic chemotherapy: Secondary | ICD-10-CM

## 2014-07-31 LAB — CBC WITH DIFFERENTIAL/PLATELET
BASO%: 0.4 % (ref 0.0–2.0)
BASOS ABS: 0 10*3/uL (ref 0.0–0.1)
EOS%: 1.1 % (ref 0.0–7.0)
Eosinophils Absolute: 0.1 10*3/uL (ref 0.0–0.5)
HEMATOCRIT: 29.3 % — AB (ref 38.4–49.9)
HEMOGLOBIN: 9.5 g/dL — AB (ref 13.0–17.1)
LYMPH%: 11 % — ABNORMAL LOW (ref 14.0–49.0)
MCH: 33.8 pg — ABNORMAL HIGH (ref 27.2–33.4)
MCHC: 32.4 g/dL (ref 32.0–36.0)
MCV: 104.3 fL — AB (ref 79.3–98.0)
MONO#: 1.1 10*3/uL — ABNORMAL HIGH (ref 0.1–0.9)
MONO%: 22.8 % — ABNORMAL HIGH (ref 0.0–14.0)
NEUT#: 3.1 10*3/uL (ref 1.5–6.5)
NEUT%: 64.7 % (ref 39.0–75.0)
Platelets: 220 10*3/uL (ref 140–400)
RBC: 2.81 10*6/uL — ABNORMAL LOW (ref 4.20–5.82)
RDW: 14.8 % — ABNORMAL HIGH (ref 11.0–14.6)
WBC: 4.7 10*3/uL (ref 4.0–10.3)
lymph#: 0.5 10*3/uL — ABNORMAL LOW (ref 0.9–3.3)
nRBC: 0 % (ref 0–0)

## 2014-07-31 LAB — COMPREHENSIVE METABOLIC PANEL (CC13)
ALK PHOS: 111 U/L (ref 40–150)
ALT: 8 U/L (ref 0–55)
AST: 21 U/L (ref 5–34)
Albumin: 2.4 g/dL — ABNORMAL LOW (ref 3.5–5.0)
Anion Gap: 6 mEq/L (ref 3–11)
BUN: 47.1 mg/dL — AB (ref 7.0–26.0)
CALCIUM: 9.1 mg/dL (ref 8.4–10.4)
CHLORIDE: 107 meq/L (ref 98–109)
CO2: 26 mEq/L (ref 22–29)
CREATININE: 0.7 mg/dL (ref 0.7–1.3)
GLUCOSE: 136 mg/dL (ref 70–140)
POTASSIUM: 4.7 meq/L (ref 3.5–5.1)
Sodium: 140 mEq/L (ref 136–145)
Total Bilirubin: 0.28 mg/dL (ref 0.20–1.20)
Total Protein: 5.6 g/dL — ABNORMAL LOW (ref 6.4–8.3)

## 2014-07-31 LAB — CULTURE, BLOOD (ROUTINE X 2)
Culture: NO GROWTH
Culture: NO GROWTH

## 2014-07-31 LAB — TECHNOLOGIST REVIEW

## 2014-07-31 MED ORDER — DEXAMETHASONE SODIUM PHOSPHATE 10 MG/ML IJ SOLN
INTRAMUSCULAR | Status: AC
  Filled 2014-07-31: qty 1

## 2014-07-31 MED ORDER — SODIUM CHLORIDE 0.9 % IV SOLN
Freq: Once | INTRAVENOUS | Status: AC
  Administered 2014-07-31: 13:00:00 via INTRAVENOUS

## 2014-07-31 MED ORDER — ONDANSETRON 8 MG/NS 50 ML IVPB
INTRAVENOUS | Status: AC
Start: 2014-07-31 — End: 2014-07-31
  Filled 2014-07-31: qty 8

## 2014-07-31 MED ORDER — ONDANSETRON 8 MG/50ML IVPB (CHCC)
8.0000 mg | Freq: Once | INTRAVENOUS | Status: AC
  Administered 2014-07-31: 8 mg via INTRAVENOUS

## 2014-07-31 MED ORDER — SODIUM CHLORIDE 0.9 % IJ SOLN
10.0000 mL | INTRAMUSCULAR | Status: DC | PRN
  Administered 2014-07-31: 10 mL
  Filled 2014-07-31: qty 10

## 2014-07-31 MED ORDER — DEXTROSE 5 % IV SOLN
20.0000 mg/m2 | Freq: Once | INTRAVENOUS | Status: AC
  Administered 2014-07-31: 40 mg via INTRAVENOUS
  Filled 2014-07-31: qty 4

## 2014-07-31 MED ORDER — HEPARIN SOD (PORK) LOCK FLUSH 100 UNIT/ML IV SOLN
500.0000 [IU] | Freq: Once | INTRAVENOUS | Status: AC | PRN
  Administered 2014-07-31: 500 [IU]
  Filled 2014-07-31: qty 5

## 2014-07-31 MED ORDER — DEXAMETHASONE SODIUM PHOSPHATE 10 MG/ML IJ SOLN
10.0000 mg | Freq: Once | INTRAMUSCULAR | Status: AC
  Administered 2014-07-31: 10 mg via INTRAVENOUS

## 2014-07-31 NOTE — Patient Instructions (Signed)
[facility] Discharge Instructions for Patients Receiving Chemotherapy  Today you received the following chemotherapy agents Taxotere.  To help prevent nausea and vomiting after your treatment, we encourage you to take your nausea medication as prescribed.   If you develop nausea and vomiting that is not controlled by your nausea medication, call the clinic.   BELOW ARE SYMPTOMS THAT SHOULD BE REPORTED IMMEDIATELY:  *FEVER GREATER THAN 100.5 F  *CHILLS WITH OR WITHOUT FEVER  NAUSEA AND VOMITING THAT IS NOT CONTROLLED WITH YOUR NAUSEA MEDICATION  *UNUSUAL SHORTNESS OF BREATH  *UNUSUAL BRUISING OR BLEEDING  TENDERNESS IN MOUTH AND THROAT WITH OR WITHOUT PRESENCE OF ULCERS  *URINARY PROBLEMS  *BOWEL PROBLEMS  UNUSUAL RASH Items with * indicate a potential emergency and should be followed up as soon as possible.  Feel free to call the clinic you have any questions or concerns. The clinic phone number is [phone].

## 2014-08-06 ENCOUNTER — Ambulatory Visit: Payer: Medicare PPO | Admitting: Physical Therapy

## 2014-08-06 DIAGNOSIS — IMO0001 Reserved for inherently not codable concepts without codable children: Secondary | ICD-10-CM | POA: Diagnosis not present

## 2014-08-08 ENCOUNTER — Encounter: Payer: Self-pay | Admitting: Radiation Oncology

## 2014-08-08 ENCOUNTER — Ambulatory Visit: Payer: Medicare PPO | Admitting: Physical Therapy

## 2014-08-08 ENCOUNTER — Ambulatory Visit (HOSPITAL_BASED_OUTPATIENT_CLINIC_OR_DEPARTMENT_OTHER): Payer: Medicare PPO | Admitting: Nurse Practitioner

## 2014-08-08 ENCOUNTER — Ambulatory Visit
Admission: RE | Admit: 2014-08-08 | Discharge: 2014-08-08 | Disposition: A | Discharge status: Home / Self Care | Payer: Medicare PPO | Source: Ambulatory Visit | Attending: Radiation Oncology | Admitting: Radiation Oncology

## 2014-08-08 VITALS — BP 118/55 | HR 115 | Temp 98.4°F | Resp 20

## 2014-08-08 VITALS — BP 90/73 | HR 127 | Resp 18 | Wt 167.8 lb

## 2014-08-08 DIAGNOSIS — C155 Malignant neoplasm of lower third of esophagus: Secondary | ICD-10-CM

## 2014-08-08 DIAGNOSIS — C782 Secondary malignant neoplasm of pleura: Secondary | ICD-10-CM

## 2014-08-08 DIAGNOSIS — IMO0001 Reserved for inherently not codable concepts without codable children: Secondary | ICD-10-CM | POA: Diagnosis not present

## 2014-08-08 DIAGNOSIS — C7951 Secondary malignant neoplasm of bone: Secondary | ICD-10-CM

## 2014-08-08 DIAGNOSIS — C7952 Secondary malignant neoplasm of bone marrow: Secondary | ICD-10-CM

## 2014-08-08 NOTE — Progress Notes (Signed)
Continues TPN. Chemotherapy held this week and next. Wife concerned the patient has received two cycles of taxotere and med onc wants to scan again already. Drinking three cans of Ensure per day to take pills. Reports difficulty swallowing thus he has to take medication with the thickened Ensure liquid. Denies diarrhea. Reports pain has declined tremendously therefore he doesn't have to take this oxycontin anymore and only takes percocet one tablet bid. Reports nausea is relieved with compazine. Denies vomiting. Reports he is able to walk with walker and does PT twice per week. Right foot drop noted.

## 2014-08-08 NOTE — Progress Notes (Addendum)
Matthew White OFFICE PROGRESS NOTE   Diagnosis:  Esophagus cancer  INTERVAL HISTORY:   Matthew White returns as scheduled. He has completed 6 treatments with weekly Taxotere (3 weeks on/1 week off). He has occasional nausea. No vomiting. He had a single mouth sore which has resolved. Bowels moving. No diarrhea. He has had some problems with constipation. He reports his weight is stable. He continues TPN. He continues to have difficulty swallowing. Pain continues to be improved. He is no longer taking OxyContin. He takes oxycodone as needed. He reports recent right subscapular pain. He has "sores" on his buttocks which are healing.  Objective:  Vital signs in last 24 hours:  Blood pressure 118/55, pulse 115, temperature 98.4 F (36.9 C), temperature source Oral, resp. rate 20.    HEENT: No thrush or ulcers. Lymphatics: 3 cm right supraclavicular lymph node. Resp: Lungs clear bilaterally. Cardio: Regular rate and rhythm. GI: Abdomen soft and nontender. No hepatomegaly. Vascular: No leg edema. Skin: Medial buttocks/gluteal fold erythematous with superficial skin breakdown.  Port-A-Cath site without erythema   Lab Results:  Lab Results  Component Value Date   WBC 4.7 07/31/2014   HGB 9.5* 07/31/2014   HCT 29.3* 07/31/2014   MCV 104.3* 07/31/2014   PLT 220 07/31/2014   NEUTROABS 3.1 07/31/2014    Imaging:  No results found.  Medications: I have reviewed the patient's current medications.  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  -Status post an esophagogastrectomy 04/20/2013 with the pathology confirming a pT1b,pN1 tumor, grade 1 with negative surgical margins.  2. History of Barrett's esophagus.  3. Adult onset diabetes.  4. Odynophagia/early  satiety. The odynophagia is better.  5. Postoperative hoarseness-status post collagen injection therapy at [facility].  6. Dysphagia progressive over the past 2-4 months. He is sheduled for a dilatation procedure in the near future.  7. Chest CT 02/14/2014 showed a new masslike partially calcified lesion in the right lower lobe peripherally. There were some new lower lobe reticulonodular opacities. A peripherally located right lower lobe superior segment nodule was larger.  PET scan 02/19/2014 with evidence of multiple hypermetabolic lung nodules, hypermetabolic bone lesions, and a hypermetabolic focus at the left side of the prostate  CT biopsy of a right lower lobe subpleural nodule 03/07/2014 confirmed metastatic adenocarcinoma consistent with esophagus cancer. HER-2 negative.  Cycle 1 CAPOX beginning 03/18/2014.  Cycle 2 CAPOX beginning 04/08/2014.  Cycle 3 CAPOX beginning 04/29/2014.  Restaging chest CT 05/17/2014 with progression of multiple bone metastases including a nearly 4 cm lesion in the T2 vertebral body essentially destroying the vertebral body and penetrating through the posterior cortex into the anterior spinal canal. Another large lesion seen in the left clavicular head. Other scattered smaller rib and spinal lesions identified. Multiple masslike opacities scattered in the lungs. Some lesions progressing in the interval and others decreasing.  He completed palliative radiation to the left clavicle, T2, and right femur.  Initiation of Taxotere 06/19/2014 weekly x3 followed by a one-week break.  Palliative radiation, L1 through the sacroiliac joints completed 07/12/2014. Cycle 2 Taxotere initiated 07/17/2014. 8. Port-A-Cath placement 03/11/2014  9. Severe pain at the left clavicle after a fall, plain x-ray of the left clavicle 03/18/2014 confirmed a lytic lesion of the medial left clavicle, PET scan at [facility] 02/19/2014 revealed a hypermetabolic medial left clavicle lesion.  10. History of  Constipation-likely secondary to narcotics.  11. Delayed nausea following cycle 2 CAPOX. Aloxi added beginning with cycle 3.  12. Impending pathologic fracture right femur status post rod placement 05/03/2014.  13. facial MRSA abscess status post incision and drainage procedure 05/30/2014  14. Malnutrition-now maintained on TNA with weight gain.  15. Elevated BUN. Question related to TPN. Improved.      Disposition: Matthew White appears unchanged. He has completed 2 cycles of weekly Taxotere. The right supraclavicular lymph node is larger. Dr. Benay Spice recommends a restaging CT scan of the chest prior to further chemotherapy.  Matthew White will be out-of-town next week to attend a wedding. We will schedule the chest CT on 08/20/2014. He will return for a followup visit with Dr. Benay Spice on 08/21/2014 to review the results. He will contact the office in the interim with any problems.  Patient seen with Dr. Benay Spice. 25 minutes recent face-to-face at today's visit with the majority of that time involved in counseling/coordination of care    Ned Card ANP/GNP-BC   08/08/2014  10:07 AM  This was a shared office visit with Ned Card. Matthew White appears to have an improved performance status and his pain is under better control. He will continue TPN. He will undergo a restaging CT evaluation prior to and office visit 08/21/2014.  Julieanne Manson, M.D.

## 2014-08-08 NOTE — Progress Notes (Signed)
Radiation Oncology         [phone] ________________________________  Name: Matthew White MRN: [id number]  Date: 08/08/2014  DOB: 08/01/1934  Follow-Up Visit Note  CC: Precious Reel, MD  Precious Reel, MD  Diagnosis:   80 yo man with metastatic lower third of esophagus cancer s/p multiple courses of XRT including: 01/24/13 - 02/27/13 Initial pre-op chemo RT for  T2/T3, N1  nodal area 45 Gy in 25 fractions, boost to 50 Gy 05/20/2014 through 06/06/2014  1. T2 vertebral body/left medial clavicle: to 30 gray  2. right femur: to 28 gray  07/01/2014-07/12/2014 painful lumbar and sacral spine metastases L1 through the SI Joints, inclusive, treated to 30 Gy in   Interval Since Last Radiation:  4  weeks  Narrative:  The patient returns today for routine follow-up.  Continues TPN. Chemotherapy held this week and next. Wife concerned the patient has received two cycles of taxotere and med onc wants to scan again already. Drinking three cans of Ensure per day to take pills. Reports difficulty swallowing thus he has to take medication with the thickened Ensure liquid. Denies diarrhea. Reports pain has declined tremendously therefore he doesn't have to take this oxycontin anymore and only takes percocet one tablet bid. Reports nausea is relieved with compazine. Denies vomiting. Reports he is able to walk with walker and does PT twice per week. Right foot drop noted                               ALLERGIES:  is allergic to ivp dye.  Meds: Current Outpatient Prescriptions  Medication Sig Dispense Refill  . ADULT TPN Inject into the vein continuous. [facility] - goes for about 14 hours      . ALPRAZolam (XANAX) 1 MG tablet Take 1 tablet (1 mg total) by mouth at bedtime as needed for sleep.  30 tablet  0  . docusate sodium (COLACE) 100 MG capsule Take 100 mg by mouth 2 (two) times daily as needed for mild constipation.       . ENSURE (ENSURE) Take 237 mLs by mouth 4 (four) times daily as needed  (for supplement.  Will use Boost or Ensure.).       Marland Kitchen HUMULIN R 100 UNIT/ML injection Inject 20 Units into the skin daily. Added with the TPN      . ibuprofen (ADVIL,MOTRIN) 200 MG tablet Take 400 mg by mouth 2 (two) times daily as needed for mild pain.       Marland Kitchen insulin glargine (LANTUS) 100 UNIT/ML injection Inject 10 Units into the skin at bedtime.      . insulin lispro (HUMALOG) 100 UNIT/ML injection Inject 0-8 Units into the skin daily as needed for high blood sugar. Patient uses a sliding scale from 0-8 units      . lidocaine (XYLOCAINE) 2 % solution       . lidocaine-prilocaine (EMLA) cream Apply 1 application topically as needed (for port).       . mirtazapine (REMERON) 15 MG tablet Take 1 tablet (15 mg total) by mouth at bedtime.  30 tablet  2  . Omega-3 Fatty Acids (FISH OIL PO) Take 1 capsule by mouth 2 (two) times daily.      Marland Kitchen omeprazole (PRILOSEC) 20 MG capsule Take 20 mg by mouth 2 (two) times daily before a meal.       . ONE TOUCH ULTRA TEST test strip       .  oxyCODONE-acetaminophen (PERCOCET/ROXICET) 5-325 MG per tablet Take 2 tablets by mouth every 4 (four) hours as needed for severe pain.  100 tablet  0  . polyethylene glycol (MIRALAX / GLYCOLAX) packet Take 17 g by mouth daily as needed for mild constipation.       Marland Kitchen PRESCRIPTION MEDICATION DOCEtaxel (TAXOTERE) 40 mg in dextrose 5 % 150 mL chemo infusion 20 mg/m2  1.89 m2 (Treatment Plan Actual)  Once 07/24/2014      . prochlorperazine (COMPAZINE) 5 MG tablet Take 1 tablet (5 mg total) by mouth every 6 (six) hours as needed for nausea or vomiting.  30 tablet  2  . lactose free nutrition (BOOST) LIQD Take 237 mLs by mouth 3 (three) times daily between meals.      Marland Kitchen loperamide (IMODIUM A-D) 2 MG tablet Take 2 mg by mouth 4 (four) times daily as needed for diarrhea or loose stools.      . OxyCODONE (OXYCONTIN) 20 mg T12A 12 hr tablet Take 1 tablet (20 mg total) by mouth every 12 (twelve) hours.  60 tablet  0   No current  facility-administered medications for this encounter.    Physical Findings: The patient is in no acute distress. Patient is alert and oriented.  weight is 167 lb 12.8 oz (76.114 kg). His blood pressure is 90/73 and his pulse is 127. His respiration is 18 and oxygen saturation is 95%. .  2 cm mobile right supraclav node.  No significant changes.  Lab Findings: Lab Results  Component Value Date   WBC 4.7 07/31/2014   HGB 9.5* 07/31/2014   HCT 29.3* 07/31/2014   MCV 104.3* 07/31/2014   PLT 220 07/31/2014    @LASTCHEM @  Radiographic Findings: Dg Chest 2 View  07/25/2014   CLINICAL DATA:  Esophageal malignancy  EXAM: CHEST  2 VIEW  COMPARISON:  Chest x-ray of March 07, 2014 and CT scan of the chest of May 17, 2014  FINDINGS: There has been interval growth of the right lower lobe pleural-based mass. It now measures 3.3 cm in diameter. No other masses on the right are demonstrated. On the left the heart border is partially obscured but this is stable. The cardiac silhouette is normal in size. The pulmonary vascularity is not engorged. The mediastinal mass is not evident on today's study. The power port appliance tip lies in the midportion of the SVC. The known bony lesions are not clearly evident on today's study.  IMPRESSION: There has been interval growth of a posterior right lower lobe mass consistent with metastatic disease. Otherwise there is no significant change in the appearance of the chest since the previous study.   Electronically Signed   By: David  Martinique   On: 07/25/2014 08:19    Impression:  The patient is recovering from the effects of radiation.  Pain improved.  Plan:  Follow-up prn.  _____________________________________  Sheral Apley. Tammi Klippel, M.D.

## 2014-08-09 ENCOUNTER — Telehealth: Payer: Self-pay | Admitting: Oncology

## 2014-08-09 NOTE — Telephone Encounter (Signed)
s.w. pt and advised on ct scan moved per MD request....pt ok adn aware of d.t

## 2014-08-12 ENCOUNTER — Ambulatory Visit: Payer: Medicare PPO | Admitting: Physical Therapy

## 2014-08-12 DIAGNOSIS — IMO0001 Reserved for inherently not codable concepts without codable children: Secondary | ICD-10-CM | POA: Diagnosis not present

## 2014-08-15 ENCOUNTER — Ambulatory Visit: Payer: Medicare PPO | Attending: Radiation Oncology | Admitting: Physical Therapy

## 2014-08-15 DIAGNOSIS — R262 Difficulty in walking, not elsewhere classified: Secondary | ICD-10-CM | POA: Diagnosis not present

## 2014-08-15 DIAGNOSIS — Z923 Personal history of irradiation: Secondary | ICD-10-CM | POA: Insufficient documentation

## 2014-08-15 DIAGNOSIS — IMO0001 Reserved for inherently not codable concepts without codable children: Secondary | ICD-10-CM | POA: Diagnosis present

## 2014-08-15 DIAGNOSIS — C7951 Secondary malignant neoplasm of bone: Secondary | ICD-10-CM | POA: Insufficient documentation

## 2014-08-15 DIAGNOSIS — R5381 Other malaise: Secondary | ICD-10-CM | POA: Diagnosis not present

## 2014-08-15 DIAGNOSIS — C7952 Secondary malignant neoplasm of bone marrow: Secondary | ICD-10-CM | POA: Diagnosis not present

## 2014-08-15 DIAGNOSIS — C159 Malignant neoplasm of esophagus, unspecified: Secondary | ICD-10-CM | POA: Insufficient documentation

## 2014-08-15 DIAGNOSIS — Z9089 Acquired absence of other organs: Secondary | ICD-10-CM | POA: Diagnosis not present

## 2014-08-15 DIAGNOSIS — Z87311 Personal history of (healed) other pathological fracture: Secondary | ICD-10-CM | POA: Diagnosis not present

## 2014-08-16 ENCOUNTER — Telehealth: Payer: Self-pay | Admitting: *Deleted

## 2014-08-16 DIAGNOSIS — C159 Malignant neoplasm of esophagus, unspecified: Secondary | ICD-10-CM

## 2014-08-16 NOTE — Telephone Encounter (Signed)
Call from Dr. Berenice Primas requesting Dr. Benay Spice call when back in the office 9/8. Hip has worsened on Xray. Wonders if he can have any additional radiation to the site.

## 2014-08-20 ENCOUNTER — Ambulatory Visit (HOSPITAL_COMMUNITY)
Admission: RE | Admit: 2014-08-20 | Discharge: 2014-08-20 | Disposition: A | Discharge status: Home / Self Care | Payer: Medicare PPO | Source: Ambulatory Visit | Attending: Oncology | Admitting: Oncology

## 2014-08-20 ENCOUNTER — Ambulatory Visit: Payer: Medicare PPO | Admitting: Physical Therapy

## 2014-08-20 ENCOUNTER — Encounter (HOSPITAL_COMMUNITY): Payer: Self-pay

## 2014-08-20 DIAGNOSIS — R222 Localized swelling, mass and lump, trunk: Secondary | ICD-10-CM | POA: Insufficient documentation

## 2014-08-20 DIAGNOSIS — C155 Malignant neoplasm of lower third of esophagus: Secondary | ICD-10-CM | POA: Diagnosis present

## 2014-08-20 DIAGNOSIS — R599 Enlarged lymph nodes, unspecified: Secondary | ICD-10-CM | POA: Diagnosis not present

## 2014-08-20 DIAGNOSIS — M949 Disorder of cartilage, unspecified: Secondary | ICD-10-CM | POA: Diagnosis not present

## 2014-08-20 DIAGNOSIS — M899 Disorder of bone, unspecified: Secondary | ICD-10-CM | POA: Diagnosis not present

## 2014-08-20 DIAGNOSIS — J90 Pleural effusion, not elsewhere classified: Secondary | ICD-10-CM | POA: Insufficient documentation

## 2014-08-20 DIAGNOSIS — IMO0001 Reserved for inherently not codable concepts without codable children: Secondary | ICD-10-CM | POA: Diagnosis not present

## 2014-08-21 ENCOUNTER — Telehealth: Payer: Self-pay | Admitting: *Deleted

## 2014-08-21 ENCOUNTER — Other Ambulatory Visit (HOSPITAL_BASED_OUTPATIENT_CLINIC_OR_DEPARTMENT_OTHER): Payer: Medicare PPO

## 2014-08-21 ENCOUNTER — Ambulatory Visit (HOSPITAL_BASED_OUTPATIENT_CLINIC_OR_DEPARTMENT_OTHER): Payer: Medicare PPO | Admitting: Oncology

## 2014-08-21 ENCOUNTER — Ambulatory Visit: Payer: Medicare PPO

## 2014-08-21 ENCOUNTER — Telehealth: Payer: Self-pay | Admitting: Oncology

## 2014-08-21 ENCOUNTER — Other Ambulatory Visit: Payer: Self-pay | Admitting: *Deleted

## 2014-08-21 VITALS — BP 98/48 | HR 129 | Temp 98.2°F | Resp 17 | Ht 73.0 in | Wt 171.3 lb

## 2014-08-21 DIAGNOSIS — C155 Malignant neoplasm of lower third of esophagus: Secondary | ICD-10-CM

## 2014-08-21 DIAGNOSIS — Z23 Encounter for immunization: Secondary | ICD-10-CM

## 2014-08-21 DIAGNOSIS — C7800 Secondary malignant neoplasm of unspecified lung: Secondary | ICD-10-CM

## 2014-08-21 DIAGNOSIS — C7951 Secondary malignant neoplasm of bone: Secondary | ICD-10-CM

## 2014-08-21 DIAGNOSIS — C7952 Secondary malignant neoplasm of bone marrow: Secondary | ICD-10-CM

## 2014-08-21 LAB — COMPREHENSIVE METABOLIC PANEL (CC13)
ALBUMIN: 2.3 g/dL — AB (ref 3.5–5.0)
ALT: 11 U/L (ref 0–55)
ANION GAP: 12 meq/L — AB (ref 3–11)
AST: 38 U/L — ABNORMAL HIGH (ref 5–34)
Alkaline Phosphatase: 144 U/L (ref 40–150)
BILIRUBIN TOTAL: 0.31 mg/dL (ref 0.20–1.20)
BUN: 44.9 mg/dL — ABNORMAL HIGH (ref 7.0–26.0)
CO2: 24 meq/L (ref 22–29)
Calcium: 9.5 mg/dL (ref 8.4–10.4)
Chloride: 104 mEq/L (ref 98–109)
Creatinine: 0.7 mg/dL (ref 0.7–1.3)
Glucose: 123 mg/dl (ref 70–140)
POTASSIUM: 4.1 meq/L (ref 3.5–5.1)
SODIUM: 139 meq/L (ref 136–145)
TOTAL PROTEIN: 6.2 g/dL — AB (ref 6.4–8.3)

## 2014-08-21 LAB — CBC WITH DIFFERENTIAL/PLATELET
BASO%: 0.6 % (ref 0.0–2.0)
Basophils Absolute: 0.1 10*3/uL (ref 0.0–0.1)
EOS ABS: 0.2 10*3/uL (ref 0.0–0.5)
EOS%: 2.1 % (ref 0.0–7.0)
HCT: 29.4 % — ABNORMAL LOW (ref 38.4–49.9)
HGB: 9.5 g/dL — ABNORMAL LOW (ref 13.0–17.1)
LYMPH%: 26.7 % (ref 14.0–49.0)
MCH: 32.6 pg (ref 27.2–33.4)
MCHC: 32.4 g/dL (ref 32.0–36.0)
MCV: 100.6 fL — ABNORMAL HIGH (ref 79.3–98.0)
MONO#: 1.6 10*3/uL — ABNORMAL HIGH (ref 0.1–0.9)
MONO%: 14.3 % — ABNORMAL HIGH (ref 0.0–14.0)
NEUT%: 56.3 % (ref 39.0–75.0)
NEUTROS ABS: 6.2 10*3/uL (ref 1.5–6.5)
Platelets: 347 10*3/uL (ref 140–400)
RBC: 2.92 10*6/uL — ABNORMAL LOW (ref 4.20–5.82)
RDW: 15.6 % — AB (ref 11.0–14.6)
WBC: 11.1 10*3/uL — AB (ref 4.0–10.3)
lymph#: 3 10*3/uL (ref 0.9–3.3)

## 2014-08-21 MED ORDER — OXYCODONE HCL 5 MG PO TABS: ORAL_TABLET | ORAL | Status: AC

## 2014-08-21 MED ORDER — INFLUENZA VAC SPLIT QUAD 0.5 ML IM SUSY
0.5000 mL | PREFILLED_SYRINGE | Freq: Once | INTRAMUSCULAR | Status: DC
  Filled 2014-08-21: qty 0.5

## 2014-08-21 NOTE — Telephone Encounter (Signed)
Per staff message and POF I have scheduled appts. Advised scheduler of appts. JMW  

## 2014-08-21 NOTE — Progress Notes (Signed)
[place] OFFICE PROGRESS NOTE   Diagnosis: esophagus cancer  INTERVAL HISTORY:   Matthew White returns as scheduled. He was last treated with Taxotere on 07/31/2014. He continues to have pain in the right thigh and groin with weightbearing. His daughter taking OxyContin. He takes Percocet for relief of pain. He has a poor appetite. He continues cyclic TNA.  Matthew White was able to attend the wedding of his son last weekend.  He has discomfort near the left scapula.    Objective:  Vital signs in last 24 hours:  Blood pressure 98/48, pulse 129, temperature 98.2 F (36.8 C), temperature source Oral, resp. rate 17, height 6' 1"  (1.854 m), weight 171 lb 4.8 oz (77.701 kg), SpO2 96.00%.    HEENT: No thrush Lymphatics: 3 cm node at the right lower neck/supraclavicular fossa Resp: decreased breath sounds at the right posterior chest, no respiratory distress Cardio: regular rate and rhythm, positive rub GI: no hepatomegaly, nontender Vascular: trace edema at the right greater than left lower leg Neuro:alert and oriented, decreased strength with dorsi flexion of the right foot and extension at the right knee  Skin:no rash  Musculoskeletal: Mass at the left mid back inferior to the scapula. Tender at the medial inferior border of the left scapula.  Portacath/PICC-without erythema  Lab Results:  Lab Results  Component Value Date   WBC 11.1* 08/21/2014   HGB 9.5* 08/21/2014   HCT 29.4* 08/21/2014   MCV 100.6* 08/21/2014   PLT 347 08/21/2014   NEUTROABS 6.2 08/21/2014     Lab Results  Component Value Date   CEA <0.5 03/12/2014    Imaging:  Ct Chest Wo Contrast  08/20/2014   CLINICAL DATA:  Esophageal cancer 2014.  EXAM: CT CHEST WITHOUT CONTRAST  TECHNIQUE: Multidetector CT imaging of the chest was performed following the standard protocol without IV contrast.  COMPARISON:  Chest CT 05/17/2014  FINDINGS: Changes of esophagectomy with gastric pull-through. No mediastinal or  axillary adenopathy. Increasing rounded soft tissue in the right hilum, likely worsening right hilar adenopathy, difficult to measure without intravenous contrast but approximately 2.6 cm on image 39.  Enlarging right lower lobe subpleural mass measuring 3.5 cm on today's study compared with 1.9 cm previously.  Left lingular subpleural nodule measures 2.6 cm, stable. Small right pleural effusion, new since prior study. Pleural thickening noted at the left lung base, stable, likely postoperative.  Enlarging destructive lytic lesion within the lateral right second rib with increasing abnormal soft tissue. Enlarging destructive lesion within the posterior fourth rib, now with associated soft tissue mass measuring approximately 3.5 cm. There is new destructive lytic lesion with soft tissue mass measuring 16 mm associated with the anterior right third rib.  Large soft tissue mass arising from the posterior left tenth rib. The soft tissue mass measures 4.4 cm. The lytic lesion within the posterior tenth rib previously measured 1.9 cm.  Increasing sclerosis within the left clavicle head lesion, likely related to treated metastasis. Decreasing soft tissue mass associated with the T2 vertebral body. Enlarging right T8 vertebral body lytic lesion currently measuring 17 mm on image 33, very small prior study. Enlarging left T10 vertebral body lesion measuring 20 mm on today's study compared to 13 mm previously.  Heart is normal size. Dense coronary artery calcifications. Right Port-A-Cath remains in place, unchanged.  Imaging into the upper abdomen shows no acute findings. Cystic area in the upper pole of the left kidney is stable.  IMPRESSION: While there is improved appearance  of the left clavicular head lytic lesion and lytic lesion with associated soft tissue associated with the T2 vertebral body, multiple other bony metastases (bilateral ribs) have enlarged and now demonstrate associated soft tissue masses. Enlarging  right T8 vertebral body lytic lesion. Enlarging left T10 vertebral body lytic lesion.  Enlarging subpleural right lower lobe mass. Stable lingular subpleural lesion.  Enlarging right hilar adenopathy.  Trace right pleural effusion.  Stable left pleural thickening.   Electronically Signed   By: Rolm Baptise M.D.   On: 08/20/2014 15:31    Medications: I have reviewed the patient's current medications.  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  -Status post an esophagogastrectomy 04/20/2013 with the pathology confirming a pT1b,pN1 tumor, grade 1 with negative surgical margins.  2. History of Barrett's esophagus.  3. Adult onset diabetes.  4. Odynophagia/early satiety. The odynophagia is better.  5. Postoperative hoarseness-status post collagen injection therapy at [facility].  6. Dysphagia progressive over the past 2-4 months. He is sheduled for a dilatation procedure in the near future.  7. Chest CT 02/14/2014 showed a new masslike partially calcified lesion in the right lower lobe peripherally. There were some new lower lobe reticulonodular opacities. A peripherally located right lower lobe superior segment nodule was larger.  PET scan 02/19/2014 with evidence of multiple hypermetabolic lung nodules, hypermetabolic bone lesions, and a hypermetabolic focus at the left side of the prostate  CT biopsy of a right lower lobe subpleural nodule 03/07/2014 confirmed metastatic adenocarcinoma consistent with esophagus cancer. HER-2 negative.  Cycle 1 CAPOX beginning 03/18/2014.  Cycle 2 CAPOX beginning 04/08/2014.  Cycle 3 CAPOX beginning 04/29/2014.  Restaging chest CT 05/17/2014 with progression of multiple bone metastases including a nearly 4 cm lesion in the T2 vertebral body essentially  destroying the vertebral body and penetrating through the posterior cortex into the anterior spinal canal. Another large lesion seen in the left clavicular head. Other scattered smaller rib and spinal lesions identified. Multiple masslike opacities scattered in the lungs. Some lesions progressing in the interval and others decreasing.  He completed palliative radiation to the left clavicle, T2, and right femur.  Initiation of Taxotere 06/19/2014 weekly x3 followed by a one-week break.  Palliative radiation, L1 through the sacroiliac joints completed 07/12/2014.  Cycle 2 Taxotere initiated 07/17/2014. Restaging CT of the chest 08/20/2014 confirmed disease progression involving multiple bone lesions with soft tissue component 8. Port-A-Cath placement 03/11/2014  9. Severe pain at the left clavicle after a fall, plain x-ray of the left clavicle 03/18/2014 confirmed a lytic lesion of the medial left clavicle, PET scan at [facility] 02/19/2014 revealed a hypermetabolic medial left clavicle lesion.  10. History of Constipation-likely secondary to narcotics.  11. Delayed nausea following cycle 2 CAPOX. Aloxi added beginning with cycle 3.  12. Impending pathologic fracture right femur status post rod placement 05/03/2014.  13. facial MRSA abscess status post incision and drainage procedure 05/30/2014  14. Malnutrition-now maintained on TNA with weight gain.  15. Elevated BUN. Question related to TPN. Improved.   Disposition: There is clinical and CT evidence of disease progression. He has a borderline performance status. I discussed treatment options at length with Matthew White and his wife. We reviewed the CT images. I explained the chance of  clinical improvement with further chemotherapy is small. I do not recommend further systemic chemotherapy. We discussed  supportive care versus a trial of biologic therapy with ramucirumab. He understands the expected response rate and survival benefit associated with this  agent. He was given reading materials and we discussed the potential toxicities associated with ramucirumab. He would like to proceed with a trial of this therapy.  Matthew White will resume OxyContin and continue oxycodone for breakthrough pain.  We will monitor his clinical status closely while on ramucirumab and plan for a Hospice referral if there is further evidence of disease progression.   Betsy Coder, MD  08/21/2014  10:49 AM

## 2014-08-21 NOTE — Telephone Encounter (Signed)
Pt confirmed labs/ov per 09/09 POF, sent msg to add chemo, gave pt AVS......KJ

## 2014-08-22 ENCOUNTER — Ambulatory Visit: Payer: Medicare PPO | Admitting: Physical Therapy

## 2014-08-25 ENCOUNTER — Other Ambulatory Visit: Payer: Self-pay | Admitting: Oncology

## 2014-08-26 ENCOUNTER — Emergency Department (HOSPITAL_COMMUNITY): Payer: Medicare PPO

## 2014-08-26 ENCOUNTER — Inpatient Hospital Stay (HOSPITAL_COMMUNITY)
Admission: EM | Admit: 2014-08-26 | Discharge: 2014-08-31 | DRG: 871 | Disposition: A | Discharge status: Home / Self Care | Payer: Medicare PPO | Attending: Internal Medicine | Admitting: Internal Medicine

## 2014-08-26 ENCOUNTER — Encounter (HOSPITAL_COMMUNITY): Payer: Self-pay | Admitting: Emergency Medicine

## 2014-08-26 ENCOUNTER — Telehealth: Payer: Self-pay | Admitting: *Deleted

## 2014-08-26 DIAGNOSIS — C7951 Secondary malignant neoplasm of bone: Secondary | ICD-10-CM | POA: Diagnosis present

## 2014-08-26 DIAGNOSIS — F1011 Alcohol abuse, in remission: Secondary | ICD-10-CM | POA: Diagnosis present

## 2014-08-26 DIAGNOSIS — Z66 Do not resuscitate: Secondary | ICD-10-CM | POA: Diagnosis present

## 2014-08-26 DIAGNOSIS — C7952 Secondary malignant neoplasm of bone marrow: Secondary | ICD-10-CM | POA: Diagnosis present

## 2014-08-26 DIAGNOSIS — Z87891 Personal history of nicotine dependence: Secondary | ICD-10-CM

## 2014-08-26 DIAGNOSIS — A419 Sepsis, unspecified organism: Secondary | ICD-10-CM | POA: Diagnosis present

## 2014-08-26 DIAGNOSIS — J189 Pneumonia, unspecified organism: Secondary | ICD-10-CM | POA: Diagnosis present

## 2014-08-26 DIAGNOSIS — Z79899 Other long term (current) drug therapy: Secondary | ICD-10-CM | POA: Diagnosis not present

## 2014-08-26 DIAGNOSIS — G8929 Other chronic pain: Secondary | ICD-10-CM | POA: Diagnosis present

## 2014-08-26 DIAGNOSIS — F411 Generalized anxiety disorder: Secondary | ICD-10-CM | POA: Diagnosis present

## 2014-08-26 DIAGNOSIS — K219 Gastro-esophageal reflux disease without esophagitis: Secondary | ICD-10-CM | POA: Diagnosis present

## 2014-08-26 DIAGNOSIS — Z794 Long term (current) use of insulin: Secondary | ICD-10-CM

## 2014-08-26 DIAGNOSIS — IMO0002 Reserved for concepts with insufficient information to code with codable children: Secondary | ICD-10-CM | POA: Diagnosis not present

## 2014-08-26 DIAGNOSIS — E119 Type 2 diabetes mellitus without complications: Secondary | ICD-10-CM | POA: Diagnosis present

## 2014-08-26 DIAGNOSIS — R52 Pain, unspecified: Secondary | ICD-10-CM

## 2014-08-26 DIAGNOSIS — L02419 Cutaneous abscess of limb, unspecified: Secondary | ICD-10-CM | POA: Diagnosis present

## 2014-08-26 DIAGNOSIS — Z515 Encounter for palliative care: Secondary | ICD-10-CM | POA: Diagnosis not present

## 2014-08-26 DIAGNOSIS — A4902 Methicillin resistant Staphylococcus aureus infection, unspecified site: Secondary | ICD-10-CM | POA: Diagnosis present

## 2014-08-26 DIAGNOSIS — E43 Unspecified severe protein-calorie malnutrition: Secondary | ICD-10-CM | POA: Diagnosis present

## 2014-08-26 DIAGNOSIS — J690 Pneumonitis due to inhalation of food and vomit: Secondary | ICD-10-CM | POA: Diagnosis present

## 2014-08-26 DIAGNOSIS — A4102 Sepsis due to Methicillin resistant Staphylococcus aureus: Principal | ICD-10-CM | POA: Diagnosis present

## 2014-08-26 DIAGNOSIS — K2270 Barrett's esophagus without dysplasia: Secondary | ICD-10-CM | POA: Diagnosis present

## 2014-08-26 DIAGNOSIS — C155 Malignant neoplasm of lower third of esophagus: Secondary | ICD-10-CM | POA: Diagnosis present

## 2014-08-26 DIAGNOSIS — R531 Weakness: Secondary | ICD-10-CM

## 2014-08-26 DIAGNOSIS — R509 Fever, unspecified: Secondary | ICD-10-CM | POA: Diagnosis present

## 2014-08-26 DIAGNOSIS — L03119 Cellulitis of unspecified part of limb: Secondary | ICD-10-CM

## 2014-08-26 LAB — CBC WITH DIFFERENTIAL/PLATELET
BASOS ABS: 0 10*3/uL (ref 0.0–0.1)
Basophils Relative: 0 % (ref 0–1)
EOS ABS: 0.3 10*3/uL (ref 0.0–0.7)
Eosinophils Relative: 2 % (ref 0–5)
HCT: 27.2 % — ABNORMAL LOW (ref 39.0–52.0)
Hemoglobin: 8.9 g/dL — ABNORMAL LOW (ref 13.0–17.0)
LYMPHS ABS: 2.7 10*3/uL (ref 0.7–4.0)
LYMPHS PCT: 21 % (ref 12–46)
MCH: 32.5 pg (ref 26.0–34.0)
MCHC: 32.7 g/dL (ref 30.0–36.0)
MCV: 99.3 fL (ref 78.0–100.0)
Monocytes Absolute: 1.7 10*3/uL — ABNORMAL HIGH (ref 0.1–1.0)
Monocytes Relative: 13 % — ABNORMAL HIGH (ref 3–12)
Neutro Abs: 8.2 10*3/uL — ABNORMAL HIGH (ref 1.7–7.7)
Neutrophils Relative %: 64 % (ref 43–77)
Platelets: 364 10*3/uL (ref 150–400)
RBC: 2.74 MIL/uL — ABNORMAL LOW (ref 4.22–5.81)
RDW: 15.2 % (ref 11.5–15.5)
WBC: 12.9 10*3/uL — ABNORMAL HIGH (ref 4.0–10.5)

## 2014-08-26 LAB — PRO B NATRIURETIC PEPTIDE: Pro B Natriuretic peptide (BNP): 612.2 pg/mL — ABNORMAL HIGH (ref 0–450)

## 2014-08-26 LAB — COMPREHENSIVE METABOLIC PANEL
ALT: 8 U/L (ref 0–53)
AST: 29 U/L (ref 0–37)
Albumin: 2.3 g/dL — ABNORMAL LOW (ref 3.5–5.2)
Alkaline Phosphatase: 172 U/L — ABNORMAL HIGH (ref 39–117)
Anion gap: 12 (ref 5–15)
BUN: 38 mg/dL — AB (ref 6–23)
CALCIUM: 9.7 mg/dL (ref 8.4–10.5)
CO2: 28 mEq/L (ref 19–32)
Chloride: 98 mEq/L (ref 96–112)
Creatinine, Ser: 0.61 mg/dL (ref 0.50–1.35)
GFR calc Af Amer: >90 mL/min (ref >90)
GFR calc non Af Amer: >90 mL/min (ref >90)
GLUCOSE: 144 mg/dL — AB (ref 70–99)
Potassium: 4.5 mEq/L (ref 3.7–5.3)
SODIUM: 138 meq/L (ref 137–147)
TOTAL PROTEIN: 6.1 g/dL (ref 6.0–8.3)
Total Bilirubin: 0.2 mg/dL — ABNORMAL LOW (ref 0.3–1.2)

## 2014-08-26 LAB — I-STAT CG4 LACTIC ACID, ED: Lactic Acid, Venous: 1.38 mmol/L (ref 0.5–2.2)

## 2014-08-26 LAB — URINALYSIS, ROUTINE W REFLEX MICROSCOPIC
BILIRUBIN URINE: NEGATIVE
Glucose, UA: NEGATIVE mg/dL
Hgb urine dipstick: NEGATIVE
Ketones, ur: NEGATIVE mg/dL
Leukocytes, UA: NEGATIVE
NITRITE: NEGATIVE
Protein, ur: NEGATIVE mg/dL
Specific Gravity, Urine: 1.016 (ref 1.005–1.030)
UROBILINOGEN UA: 0.2 mg/dL (ref 0.0–1.0)
pH: 7 (ref 5.0–8.0)

## 2014-08-26 LAB — MAGNESIUM: MAGNESIUM: 2 mg/dL (ref 1.5–2.5)

## 2014-08-26 LAB — PHOSPHORUS: PHOSPHORUS: 2.5 mg/dL (ref 2.3–4.6)

## 2014-08-26 LAB — MRSA PCR SCREENING: MRSA by PCR: POSITIVE — AB

## 2014-08-26 LAB — TROPONIN I: Troponin I: <0.3 ng/mL (ref <0.30)

## 2014-08-26 MED ORDER — LORAZEPAM 2 MG/ML IJ SOLN
0.5000 mg | INTRAMUSCULAR | Status: DC | PRN
  Administered 2014-08-26 – 2014-08-30 (×9): 0.5 mg via INTRAVENOUS
  Filled 2014-08-26 (×10): qty 1

## 2014-08-26 MED ORDER — CHLORHEXIDINE GLUCONATE CLOTH 2 % EX PADS
6.0000 | MEDICATED_PAD | Freq: Every day | CUTANEOUS | Status: AC
  Administered 2014-08-27 – 2014-08-31 (×5): 6 via TOPICAL

## 2014-08-26 MED ORDER — ACETAMINOPHEN 650 MG RE SUPP
650.0000 mg | Freq: Once | RECTAL | Status: AC
  Administered 2014-08-26: 650 mg via RECTAL
  Filled 2014-08-26: qty 1

## 2014-08-26 MED ORDER — PANTOPRAZOLE SODIUM 40 MG PO TBEC
40.0000 mg | DELAYED_RELEASE_TABLET | Freq: Every day | ORAL | Status: DC
  Administered 2014-08-26 – 2014-08-27 (×2): 40 mg via ORAL
  Filled 2014-08-26 (×4): qty 1

## 2014-08-26 MED ORDER — PIPERACILLIN-TAZOBACTAM 3.375 G IVPB 30 MIN: 3.3750 g | Freq: Four times a day (QID) | INTRAVENOUS | Status: DC

## 2014-08-26 MED ORDER — IPRATROPIUM-ALBUTEROL 0.5-2.5 (3) MG/3ML IN SOLN
3.0000 mL | RESPIRATORY_TRACT | Status: DC
  Administered 2014-08-26 – 2014-08-27 (×5): 3 mL via RESPIRATORY_TRACT
  Filled 2014-08-26 (×5): qty 3

## 2014-08-26 MED ORDER — BOOST PO LIQD
237.0000 mL | Freq: Three times a day (TID) | ORAL | Status: DC
  Administered 2014-08-26 – 2014-08-28 (×4): 237 mL via ORAL
  Filled 2014-08-26 (×15): qty 237

## 2014-08-26 MED ORDER — PIPERACILLIN-TAZOBACTAM 3.375 G IVPB
3.3750 g | Freq: Three times a day (TID) | INTRAVENOUS | Status: AC
  Administered 2014-08-26 – 2014-08-29 (×9): 3.375 g via INTRAVENOUS
  Filled 2014-08-26 (×9): qty 50

## 2014-08-26 MED ORDER — ACETAMINOPHEN 325 MG PO TABS: 650.0000 mg | ORAL_TABLET | Freq: Four times a day (QID) | ORAL | Status: DC | PRN

## 2014-08-26 MED ORDER — ENOXAPARIN SODIUM 40 MG/0.4ML ~~LOC~~ SOLN
40.0000 mg | Freq: Every day | SUBCUTANEOUS | Status: DC
Start: 2014-08-26 — End: 2014-08-30
  Administered 2014-08-27 – 2014-08-29 (×3): 40 mg via SUBCUTANEOUS
  Filled 2014-08-26 (×5): qty 0.4

## 2014-08-26 MED ORDER — HYDROMORPHONE HCL PF 1 MG/ML IJ SOLN
1.0000 mg | INTRAMUSCULAR | Status: DC | PRN
  Administered 2014-08-26 – 2014-08-27 (×2): 1 mg via INTRAVENOUS
  Filled 2014-08-26 (×2): qty 1

## 2014-08-26 MED ORDER — OXYCODONE HCL 5 MG PO TABS
5.0000 mg | ORAL_TABLET | Freq: Four times a day (QID) | ORAL | Status: DC | PRN
  Filled 2014-08-26: qty 1

## 2014-08-26 MED ORDER — OXYCODONE HCL 5 MG PO TABS
5.0000 mg | ORAL_TABLET | Freq: Four times a day (QID) | ORAL | Status: DC | PRN
  Administered 2014-08-26: 5 mg via ORAL
  Administered 2014-08-27: 10 mg via ORAL
  Filled 2014-08-26 (×2): qty 2

## 2014-08-26 MED ORDER — DEXTROSE-NACL 5-0.45 % IV SOLN
INTRAVENOUS | Status: DC
  Administered 2014-08-26 – 2014-08-30 (×5): via INTRAVENOUS
  Administered 2014-08-30: 1000 mL via INTRAVENOUS
  Administered 2014-08-31: 09:00:00 via INTRAVENOUS

## 2014-08-26 MED ORDER — ALPRAZOLAM 0.5 MG PO TABS
0.5000 mg | ORAL_TABLET | Freq: Every evening | ORAL | Status: DC | PRN
  Administered 2014-08-26: 1 mg via ORAL
  Filled 2014-08-26: qty 2

## 2014-08-26 MED ORDER — ONDANSETRON HCL 4 MG/2ML IJ SOLN: 4.0000 mg | Freq: Four times a day (QID) | INTRAMUSCULAR | Status: DC | PRN

## 2014-08-26 MED ORDER — CETYLPYRIDINIUM CHLORIDE 0.05 % MT LIQD
7.0000 mL | Freq: Two times a day (BID) | OROMUCOSAL | Status: DC
  Administered 2014-08-26 – 2014-08-31 (×9): 7 mL via OROMUCOSAL

## 2014-08-26 MED ORDER — PROCHLORPERAZINE MALEATE 10 MG PO TABS
5.0000 mg | ORAL_TABLET | Freq: Four times a day (QID) | ORAL | Status: DC | PRN
  Filled 2014-08-26: qty 1

## 2014-08-26 MED ORDER — PIPERACILLIN-TAZOBACTAM 3.375 G IVPB: 3.3750 g | Freq: Once | INTRAVENOUS | Status: DC

## 2014-08-26 MED ORDER — VANCOMYCIN HCL IN DEXTROSE 1-5 GM/200ML-% IV SOLN
1000.0000 mg | Freq: Once | INTRAVENOUS | Status: AC
  Administered 2014-08-26: 1000 mg via INTRAVENOUS
  Filled 2014-08-26: qty 200

## 2014-08-26 MED ORDER — VANCOMYCIN HCL IN DEXTROSE 750-5 MG/150ML-% IV SOLN
750.0000 mg | Freq: Two times a day (BID) | INTRAVENOUS | Status: DC
  Administered 2014-08-26 – 2014-08-30 (×9): 750 mg via INTRAVENOUS
  Filled 2014-08-26 (×14): qty 150

## 2014-08-26 MED ORDER — DOCUSATE SODIUM 100 MG PO CAPS
100.0000 mg | ORAL_CAPSULE | Freq: Two times a day (BID) | ORAL | Status: DC | PRN
  Filled 2014-08-26: qty 1

## 2014-08-26 MED ORDER — MUPIROCIN 2 % EX OINT
1.0000 "application " | TOPICAL_OINTMENT | Freq: Two times a day (BID) | CUTANEOUS | Status: AC
  Administered 2014-08-26 – 2014-08-31 (×10): 1 via NASAL
  Filled 2014-08-26: qty 22

## 2014-08-26 MED ORDER — ACETAMINOPHEN 650 MG RE SUPP
650.0000 mg | Freq: Four times a day (QID) | RECTAL | Status: DC | PRN
  Administered 2014-08-28: 650 mg via RECTAL
  Filled 2014-08-26: qty 1

## 2014-08-26 MED ORDER — PIPERACILLIN-TAZOBACTAM 3.375 G IVPB 30 MIN
3.3750 g | Freq: Once | INTRAVENOUS | Status: AC
  Administered 2014-08-26: 3.375 g via INTRAVENOUS
  Filled 2014-08-26: qty 50

## 2014-08-26 MED ORDER — LIDOCAINE-PRILOCAINE 2.5-2.5 % EX CREA
1.0000 "application " | TOPICAL_CREAM | CUTANEOUS | Status: DC | PRN
  Filled 2014-08-26: qty 5

## 2014-08-26 MED ORDER — OXYCODONE HCL ER 20 MG PO T12A
20.0000 mg | EXTENDED_RELEASE_TABLET | Freq: Two times a day (BID) | ORAL | Status: DC
  Administered 2014-08-26 – 2014-08-28 (×4): 20 mg via ORAL
  Filled 2014-08-26 (×4): qty 1

## 2014-08-26 MED ORDER — ONDANSETRON HCL 4 MG PO TABS: 4.0000 mg | ORAL_TABLET | Freq: Four times a day (QID) | ORAL | Status: DC | PRN

## 2014-08-26 MED ORDER — IBUPROFEN 200 MG PO TABS: 400.0000 mg | ORAL_TABLET | Freq: Two times a day (BID) | ORAL | Status: DC | PRN

## 2014-08-26 MED ORDER — CHLORHEXIDINE GLUCONATE 0.12 % MT SOLN
15.0000 mL | Freq: Two times a day (BID) | OROMUCOSAL | Status: DC
  Administered 2014-08-26 – 2014-08-31 (×9): 15 mL via OROMUCOSAL
  Filled 2014-08-26 (×11): qty 15

## 2014-08-26 MED ORDER — POLYETHYLENE GLYCOL 3350 17 G PO PACK: 17.0000 g | PACK | Freq: Every day | ORAL | Status: DC | PRN

## 2014-08-26 MED ORDER — VANCOMYCIN HCL IN DEXTROSE 1-5 GM/200ML-% IV SOLN: 1000.0000 mg | INTRAVENOUS | Status: DC

## 2014-08-26 NOTE — ED Notes (Signed)
Pt arrived via EMS from home where pt had a fever last night that has continued to today. Per EMS family sts pt is also having increased weakness compared to normal. Pt does have a port-a-cath already accessed on arrival. Pt has had some SOB per EMS.

## 2014-08-26 NOTE — Progress Notes (Addendum)
Pt admitted to SD unit via stretcher from ED. On assessment we found multiple skin issues including moisture associated skin damage to sacrum with what looks like multiple healed decubitus ulcers surrounding and stage 2 in the middle. He also has a red,tender area on his right hip that has a protruding section that may have fluid inside of it. Will report to MD when he is at bedside. Pt and wife are aware of skin problems and report his skin looks better now than a couple weeks ago.

## 2014-08-26 NOTE — ED Notes (Signed)
Family at bedside. 

## 2014-08-26 NOTE — ED Notes (Signed)
ICU nurse will call back for report.

## 2014-08-26 NOTE — H&P (Signed)
Matthew White is an 80 y.o. male.   PCP:   Gwen Pounds, MD   Chief Complaint:  PNA, Tachypnea, Tachycardia  HPI:   44 male pt c metastatic Esoph CA on Chemo and HS TNA.  He presnts to ED c @ 5 days ofNight sweats and Fever spike to 102 last night.   He has had some cough and shortness of breath. He has also had worsening weakness. No reported history of dysuria or hematuria. No vomiting or diarrhea noted. + coughing/wheezing.  They called Dr Truett Perna last night and he was sent to ED by EMS. Pt does have a port-a-cath and it was already accessed on arrival.  EKG showed sinus Tachy. WBC 12.9.  BNP elevated at 612.  CXR c/w New patchy bilateral airspace opacities in the setting of increased pleural effusions and mild cardiomegaly, most likely edema. However, progression of metastatic disease or other alveolar filling processes could appear similar.  He was Dxed with PNA and given Vanco and Zosyn.  I was called for inpt admit.  He was admitted to step down d/t how ill he is.  i am seeing the pt in 1227.  He seems to know he is dying.  Wife not ready to let go.  Long Discussion @ end of life, QOL and other issues.   Past Medical History:  Past Medical History  Diagnosis Date  . Gout     no flare up in ten years   . Barrett esophagus   . Esophageal cancer 12/22/2012    invasive adenocarcinoma arising in a background of barrett's mucosa  . Diabetes mellitus without complication     type II; last A1C 6.5 10/2012  . GERD (gastroesophageal reflux disease)     managed by prilosec  . Hx of pleural effusion 10/2013    left. pt states it was drained at [facility].     Past Surgical History  Procedure Laterality Date  . Nissen fundoplication    . Tonsillectomy    . Laryngoscopy    . Esophagogastroduodenoscopy  10/18/2012    Procedure: ESOPHAGOGASTRODUODENOSCOPY (EGD);  Surgeon: Vertell Novak., MD;  Location: [facility];  Service: Endoscopy;  Laterality: N/A;  need xray  . Savory dilation   10/18/2012    Procedure: SAVORY DILATION;  Surgeon: Vertell Novak., MD;  Location: [facility];  Service: Endoscopy;  Laterality: N/A;  . Esophagogastroduodenoscopy  12/22/2012    Procedure: ESOPHAGOGASTRODUODENOSCOPY (EGD);  Surgeon: Vertell Novak., MD;  Location: [facility];  Service: Endoscopy;  Laterality: N/A;  with C-Arm  . Savory dilation  12/22/2012    Procedure: SAVORY DILATION;  Surgeon: Vertell Novak., MD;  Location: [facility];  Service: Endoscopy;  Laterality: N/A;  . Eus  01/10/2013    Procedure: ESOPHAGEAL ENDOSCOPIC ULTRASOUND (EUS) RADIAL;  Surgeon: Willis Modena, MD;  Location: [facility];  Service: Endoscopy;  Laterality: N/A;  . Removal of breast tissue    . Esophagectomy  04/20/2013  . Larynx surgery  11/2013    LARYNGEAL REPAIR   . Cholecystectomy    . Femur im nail Right 05/03/2014    Procedure: INTRAMEDULLARY (IM) NAIL FEMORAL;  Surgeon: Harvie Junior, MD;  Location: [facility] OR;  Service: Orthopedics;  Laterality: Right;     DM2 Dxed 1/03. Former excessive ETOH-resolved, Broken arm 1952, GERD/HH/Barrett's/Stricture--Dilitation Dr Randa Evens, Gout, H/O SBO, Onchymycoses, RLS/Nighttime leg cramps, Hyperlipidemia, Rosacea, Pyronies Dz, ED, Rosacea. H/O foot Cellulitis.  LDL is 110/TG is 214  Esophageal Carcinoma  S/P Extensive Treatment. Esophagectomy 04/20/13, Pl effusion tap 10/23/2013, Larnx plasty 11/16/13. Recurrent CA c CHEMO/XRT sugical pin for impending Hip Patholic Fx April - June 7124  Facial Abscess 05/2014  Surgical History (reviewed - no changes required): Intestinal blockage SBO/LBG NGT lavage, Bowel rec 1/03  Recurrent SBO 11/06  Laryngoscopy 1958, benign polyp  T & A 1942  August 2013 - Eye Lid surgery Amy Vickki Muff  Family History (reviewed - no changes required): Father died at age 56, DM2, Agranulocytosis.  Mother died at age 22  Sister OA  Brother Gout  Brother CAD, CAGB  Social History (reviewed - no changes required): Patient is married, 4  children, 3 grandchildren. Retired;part-time auto-trader. Quit smoking in 1980, quit drinking in 1990.  Family History Summary:  Reviewed history Last on 06/07/2014 and no changes required:07/10/2014  Father Ailene Ravel.) - Has Family History of Diabetes - Entered On: 08/21/2013  General Comments - FH:  Father died at age 27, DM2, Agranulocytosis.  Mother died at age 22  Sister OA  Brother Gout  Brother CAD, CAGB  Social History:  Reviewed history from 06/30/2009 and no changes required:  Patient is married, 4 children, 3 grandchildren. Retired;part-time auto-trader. Quit smoking in 1980, quit drinking in 1990.       Allergies:   Allergies  Allergen Reactions  . Ivp Dye [Iodinated Diagnostic Agents] Swelling    13 hr prep/reaction was 20 years ago per patient     Medications: Prior to Admission medications   Medication Sig Start Date End Date Taking? Authorizing Provider  ADULT TPN Inject into the vein continuous. [facility] - goes for about 14 hours    Historical Provider, MD  ALPRAZolam Duanne Moron) 1 MG tablet Take 1 tablet (1 mg total) by mouth at bedtime as needed for sleep. 07/18/14   Ladell Pier, MD  docusate sodium (COLACE) 100 MG capsule Take 100 mg by mouth 2 (two) times daily as needed for mild constipation.     Historical Provider, MD  ENSURE (ENSURE) Take 237 mLs by mouth 4 (four) times daily as needed (for supplement.  Will use Boost or Ensure.).     Historical Provider, MD  HUMULIN R 100 UNIT/ML injection Inject 20 Units into the skin daily. Added with the TPN 06/27/14   Historical Provider, MD  ibuprofen (ADVIL,MOTRIN) 200 MG tablet Take 400 mg by mouth 2 (two) times daily as needed for mild pain.     Historical Provider, MD  insulin glargine (LANTUS) 100 UNIT/ML injection Inject 10 Units into the skin at bedtime.    Historical Provider, MD  insulin lispro (HUMALOG) 100 UNIT/ML injection Inject 0-8 Units into the skin daily as needed for high blood sugar. Patient uses  a sliding scale from 0-8 units    Historical Provider, MD  lactose free nutrition (BOOST) LIQD Take 237 mLs by mouth 3 (three) times daily between meals.    Historical Provider, MD  lidocaine (XYLOCAINE) 2 % solution  06/07/14   Historical Provider, MD  lidocaine-prilocaine (EMLA) cream Apply 1 application topically as needed (for port).     Historical Provider, MD  loperamide (IMODIUM A-D) 2 MG tablet Take 2 mg by mouth 4 (four) times daily as needed for diarrhea or loose stools.    Historical Provider, MD  mirtazapine (REMERON) 15 MG tablet Take 1 tablet (15 mg total) by mouth at bedtime. 06/06/14   Ladell Pier, MD  Omega-3 Fatty Acids (FISH OIL PO) Take 1 capsule by mouth 2 (two)  times daily.    Historical Provider, MD  omeprazole (PRILOSEC) 20 MG capsule Take 20 mg by mouth 2 (two) times daily before a meal.     Historical Provider, MD  ONE TOUCH ULTRA TEST test strip  07/22/14   Historical Provider, MD  oxyCODONE (OXY IR/ROXICODONE) 5 MG immediate release tablet Take 1-3 tablets by mouth every 4 hours as needed 08/21/14   Ladell Pier, MD  OxyCODONE (OXYCONTIN) 20 mg T12A 12 hr tablet Take 1 tablet (20 mg total) by mouth every 12 (twelve) hours. 07/29/14   Ladell Pier, MD  polyethylene glycol [facility] Floria Raveling) packet Take 17 g by mouth daily as needed for mild constipation.     Historical Provider, MD  PRESCRIPTION MEDICATION DOCEtaxel (TAXOTERE) 40 mg in dextrose 5 % 150 mL chemo infusion 20 mg/m2  1.89 m2 (Treatment Plan Actual)  Once 07/24/2014    Historical Provider, MD  prochlorperazine (COMPAZINE) 5 MG tablet Take 1 tablet (5 mg total) by mouth every 6 (six) hours as needed for nausea or vomiting. 03/12/14   Ladell Pier, MD     Complete Medication List:  1) Mirtazapine 15 Mg Oral Tabs (Mirtazapine) .... One hs  2) Tpn Electrolytes Iv Soln (Parenteral electrolytes) .... 2400ccs 18hr/24  3) Vancomycin Hcl 750 Mg Iv Solr (Vancomycin hcl) .... Bid  4) Doxycycline Hyclate 100 Mg  Oral Caps (Doxycycline hyclate) .... Bid  5) Decadron 2mg  .... Bid  6) Oxycontin 20 Mg Oral T12a (Oxycodone hcl) .... Bid  7) Percocet 5-325 Mg Oral Tabs (Oxycodone-acetaminophen) .... 2 q4h prn  8) Humalog Kwikpen 100 Unit/ml Soln (Insulin lispro (human)) .... Ssi 2-3 times per day cbg 150-250 4 units cbg 251-400 6 units  9) Bd Pen Needle Ultrafine 29g X 12.24mm Misc (Insulin pen needle) .... Use 4 pen needles daily dx: 250.00  10) Lantus Solostar 100 Unit/ml Soln (Insulin glargine) .Marland Kitchen.. 10-12 units qhs  11) Xanax 0.5 Mg Tabs (Alprazolam) .Marland Kitchen.. 1 po qhs  12) Omeprazole 20 Mg Cpdr (Omeprazole) .... One po bid  13) Onetouch Ultra Blue Strp (Glucose blood) .... Test blood sugars twice daily     Medications Prior to Admission  Medication Sig Dispense Refill  . ADULT TPN Inject into the vein continuous. [facility] - goes for about 14 hours      . ALPRAZolam (XANAX) 1 MG tablet Take 0.5-1 mg by mouth at bedtime as needed for sleep.      Marland Kitchen docusate sodium (COLACE) 100 MG capsule Take 100 mg by mouth 2 (two) times daily as needed for mild constipation. 100mg  in the morning and 200mg  at bedtime      . ENSURE (ENSURE) Take 237 mLs by mouth 4 (four) times daily as needed (for supplement.  Will use Boost or Ensure.).       Marland Kitchen HUMULIN R 100 UNIT/ML injection Inject 20 Units into the skin daily. Added with the TPN      . ibuprofen (ADVIL,MOTRIN) 200 MG tablet Take 400 mg by mouth 2 (two) times daily as needed for mild pain.       Marland Kitchen insulin glargine (LANTUS) 100 UNIT/ML injection Inject 10 Units into the skin at bedtime.      . insulin lispro (HUMALOG) 100 UNIT/ML injection Inject 0-8 Units into the skin daily as needed for high blood sugar. Patient uses a sliding scale from 0-8 units      . lactose free nutrition (BOOST) LIQD Take 237 mLs by mouth 3 (three)  times daily between meals.      . lidocaine-prilocaine (EMLA) cream Apply 1 application topically as needed (for port).       Marland Kitchen loperamide  (IMODIUM A-D) 2 MG tablet Take 2 mg by mouth 4 (four) times daily as needed for diarrhea or loose stools.      Marland Kitchen omeprazole (PRILOSEC) 20 MG capsule Take 20 mg by mouth 2 (two) times daily before a meal.       . oxyCODONE (OXY IR/ROXICODONE) 5 MG immediate release tablet Take 1-3 tablets by mouth every 4 hours as needed  75 tablet  0  . OxyCODONE (OXYCONTIN) 20 mg T12A 12 hr tablet Take 1 tablet (20 mg total) by mouth every 12 (twelve) hours.  60 tablet  0  . oxyCODONE-acetaminophen (PERCOCET/ROXICET) 5-325 MG per tablet Take 1 tablet by mouth every 6 (six) hours as needed for severe pain.      . polyethylene glycol (MIRALAX / GLYCOLAX) packet Take 17 g by mouth daily as needed for mild constipation.       Marland Kitchen PRESCRIPTION MEDICATION DOCEtaxel (TAXOTERE) 40 mg in dextrose 5 % 150 mL chemo infusion 20 mg/m2  1.89 m2 (Treatment Plan Actual)      . prochlorperazine (COMPAZINE) 5 MG tablet Take 1 tablet (5 mg total) by mouth every 6 (six) hours as needed for nausea or vomiting.  30 tablet  2  . sodium phosphate (FLEET) enema Place 1 enema rectally once. follow package directions         Social History:  reports that he quit smoking about 30 years ago. He started smoking about 59 years ago. He has never used smokeless tobacco. He reports that he does not drink alcohol or use illicit drugs.  Family History: Family History  Problem Relation Age of Onset  . Cancer Father     pancreatic cancer; questionable miss dx  . Dementia Father   . Cancer Mother     cervical    Review of Systems:  Review of Systems - Severe pain, SOB, congestion, fever, AFTT.  Very ill   Physical Exam:  Blood pressure 127/63, pulse 112, temperature 99.2 F (37.3 C), temperature source Oral, resp. rate 24, height 6' 2" (1.88 m), weight 78 kg (171 lb 15.3 oz), SpO2 90.00%. Filed Vitals:   08/26/14 1351 08/26/14 1500 08/26/14 1515 08/26/14 1600  BP: 125/60 153/65 126/55 127/63  Pulse:  119 119 112  Temp: 99.2 F (37.3 C)      TempSrc: Oral     Resp: _0 Height:  6' 2" (1.88 m)    Weight:  78 kg (171 lb 15.3 oz)    SpO2: 92% 92% 91% 90%   General appearance: Looks older. Slurred speech.Very weak and frail Head: Normocephalic, without obvious abnormality, atraumatic Eyes: conjunctivae/corneas clear. PERRL, EOM's intact.  Nose: Nares normal. Septum midline. Mucosa normal. No drainage or sinus tenderness. Throat:OP dry and poor dentition Neck: no adenopathy, no carotid bruit, no JVD and thyroid not enlarged, symmetric, no tenderness/mass/nodules Resp: + Rhonchi.  Using [employer]: Reg Tachy Chest [facility] CD[facility] GI: soft, non-tender; bowel sounds normal; no masses,  no organomegaly R Hip is warm with ?fluctuant blister at surgical site Neurologic: Alert and oriented X 3,VERY WEAK    Labs on Admission:   Recent Labs  08/26/14 1105  NA 138  K 4.5  CL 98  CO2 28  GLUCOSE 144*  BUN 38*  CREATININE 0.61  CALCIUM 9.7  MG 2.0  PHOS 2.5    Recent Labs  08/26/14 1105  AST 29  ALT 8  ALKPHOS 172*  BILITOT 0.2*  PROT 6.1  ALBUMIN 2.3*   No results found for this basename: LIPASE, AMYLASE,  in the last 72 hours  Recent Labs  08/26/14 1105  WBC 12.9*  NEUTROABS 8.2*  HGB 8.9*  HCT 27.2*  MCV 99.3  PLT 364    Recent Labs  08/26/14 1105  TROPONINI <0.30   Lab Results  Component Value Date   INR 1.01 05/30/2014   INR 1.04 05/03/2014   INR 1.00 03/07/2014     LAB RESULT POCT:  Results for orders placed during the hospital encounter of 08/26/14  CBC WITH DIFFERENTIAL      Result Value Ref Range   WBC 12.9 (*) 4.0 - 10.5 K/uL   RBC 2.74 (*) 4.22 - 5.81 MIL/uL   Hemoglobin 8.9 (*) 13.0 - 17.0 g/dL   HCT 27.2 (*) 39.0 - 52.0 %   MCV 99.3  78.0 - 100.0 fL   MCH 32.5  26.0 - 34.0 pg   MCHC 32.7  30.0 - 36.0 g/dL   RDW 15.2  11.5 - 15.5 %   Platelets 364  150 - 400 K/uL   Neutrophils Relative % 64  43 - 77 %   Lymphocytes Relative 21  12 - 46 %   Monocytes Relative 13  (*) 3 - 12 %   Eosinophils Relative 2  0 - 5 %   Basophils Relative 0  0 - 1 %   Neutro Abs 8.2 (*) 1.7 - 7.7 K/uL   Lymphs Abs 2.7  0.7 - 4.0 K/uL   Monocytes Absolute 1.7 (*) 0.1 - 1.0 K/uL   Eosinophils Absolute 0.3  0.0 - 0.7 K/uL   Basophils Absolute 0.0  0.0 - 0.1 K/uL   RBC Morphology POLYCHROMASIA PRESENT     WBC Morphology MILD LEFT SHIFT (1-5% METAS, OCC MYELO, OCC BANDS)    COMPREHENSIVE METABOLIC PANEL      Result Value Ref Range   Sodium 138  137 - 147 mEq/L   Potassium 4.5  3.7 - 5.3 mEq/L   Chloride 98  96 - 112 mEq/L   CO2 28  19 - 32 mEq/L   Glucose, Bld 144 (*) 70 - 99 mg/dL   BUN 38 (*) 6 - 23 mg/dL   Creatinine, Ser 0.61  0.50 - 1.35 mg/dL   Calcium 9.7  8.4 - 10.5 mg/dL   Total Protein 6.1  6.0 - 8.3 g/dL   Albumin 2.3 (*) 3.5 - 5.2 g/dL   AST 29  0 - 37 U/L   ALT 8  0 - 53 U/L   Alkaline Phosphatase 172 (*) 39 - 117 U/L   Total Bilirubin 0.2 (*) 0.3 - 1.2 mg/dL   GFR calc non Af Amer >90  >90 mL/min   GFR calc Af Amer >90  >90 mL/min   Anion gap 12  5 - 15  URINALYSIS, ROUTINE W REFLEX MICROSCOPIC      Result Value Ref Range   Color, Urine YELLOW  YELLOW   APPearance CLOUDY (*) CLEAR   Specific Gravity, Urine 1.016  1.005 - 1.030   pH 7.0  5.0 - 8.0   Glucose, UA NEGATIVE  NEGATIVE mg/dL   Hgb urine dipstick NEGATIVE  NEGATIVE   Bilirubin Urine NEGATIVE  NEGATIVE   Ketones, ur NEGATIVE  NEGATIVE mg/dL   Protein, ur NEGATIVE  NEGATIVE mg/dL  Urobilinogen, UA 0.2  0.0 - 1.0 mg/dL   Nitrite NEGATIVE  NEGATIVE   Leukocytes, UA NEGATIVE  NEGATIVE  MAGNESIUM      Result Value Ref Range   Magnesium 2.0  1.5 - 2.5 mg/dL  PHOSPHORUS      Result Value Ref Range   Phosphorus 2.5  2.3 - 4.6 mg/dL  PRO B NATRIURETIC PEPTIDE      Result Value Ref Range   Pro B Natriuretic peptide (BNP) 612.2 (*) 0 - 450 pg/mL  TROPONIN I      Result Value Ref Range   Troponin I <0.30  <0.30 ng/mL  I-STAT CG4 LACTIC ACID, ED      Result Value Ref Range   Lactic Acid,  Venous 1.38  0.5 - 2.2 mmol/L      Radiological Exams on Admission: Dg Chest Port 1 View  (if Code Sepsis Called)  08/26/2014   CLINICAL DATA:  Fever, review of electronic medical record indicates a history of esophageal cancer  EXAM: PORTABLE CHEST - 1 VIEW  COMPARISON:  08/20/2014 CT, 07/24/2014 chest radiograph  FINDINGS: Right-sided Port-A-Cath in place with tip at the cavoatrial junction. Heart size upper limits of normal. Increased left pleural effusion is identified, currently small. Trace right pleural effusion is new since the prior study. There are new patchy ill-defined airspace opacities bilaterally with central vascular congestion. Underlying pulmonary nodules are reidentified but poorly visualized as compared to the prior exam.  IMPRESSION: New patchy bilateral airspace opacities in the setting of increased pleural effusions and mild cardiomegaly, most likely edema. However, progression of metastatic disease or other alveolar filling processes could appear similar.   Electronically Signed   By: Christiana Pellant M.D.   On: 08/26/2014 11:36      Orders placed during the hospital encounter of 08/26/14  . EKG 12-LEAD  . EKG 12-LEAD     Assessment/Plan Active Problems:   Malignant neoplasm of lower third of esophagus   Protein-calorie malnutrition, severe   Pneumonia   Aspiration pneumonia  #1 - clinical PNA vrs Post Obstructive vrs Aspiration c fever/Leukocytosis/cough/Wheeze/hypoxia and infiltrate on CXR - Zosyn/Vanco - I discussed c pt that we could treat this or just do Hospice palliative care. Wife elects Rx and eval over 1-3 days and decide then.  He seems more receptive to Hospice palliative care and comfort measures only.  I think they need 1-2 days to absorb this and realize that the medical community has nothing left to offer. - in the meantime DNR, No escalation of care, Allow to eat, use [employer], treat pain and anxiety. - they are receptive to Hospice Palliative care  consult - Will set up for tomorrow. - Will put Biologics on Hold. 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 Enlarging right T8 vertebral body lytic lesion. Enlarging left T10 vertebral body lytic lesion. Enlarging subpleural right lower lobe mass. Stable lingular subpleural lesion. Enlarging right hilar  adenopathy. Trace right pleural effusion. Stable left pleural thickening  At last OV c Dr Benay Spice they discussed CT progression and no further Chemo to be done however they were going to proceed c a trial of biologic therapy with ramucirumab  He already is on OxyContin and continue oxycodone for breakthrough pain and discussion for Hospice/Palliative Care referral if there is further evidence of disease progression has been discussed.  Dr Tammi Klippel is rad Onc.  Dr Benay Spice is Oncologist Using [facility].   He is and will remain a DNR.  Time may have run out and we may be at point of hospice palliative care only now. I discussed c Dr Benay Spice who will see him in am   #3 Malnutrition, protein-calorie - Will Hold/Stop TNA currently.  Gained weight c the TNA and made it to sons wedding.  The TNA offered him a better QOL for extended period of time. [facility] had [facility]   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 Nondisplaced fracture of  the right greater trochanter is identified and there is a fracture through the anterior cortex of the base of the right femoral neck.  Metastatic disease about the pelvis and right hip with lesions identified in the sacrum, right ilium and lesser trochanter  He started XRT immediately after this scan  Ththis is the source of his Pains.   #5 chronic pain   On Narcs for Rx.   #6 Anxiety   Xanax 0.5 Mg Tabs (Alprazolam) .Marland Kitchen... 1 po qhs  Up to 1 mg HS Xanax    #7 DIABETES MELLITUS, TYPE II, CONTROLLED Hgb A1C: 05/30/14 A1C: 6.4% @ Hosp  Humalog SSI and Lantus had been held at home.  #8 - Will not do DVT prophylaxis as it will not matter  #9 R Hip may be infectedd as well.  Vanc ought to help.  He is not strong enough for an operation.  Follow clinically   Tityana Pagan M 08/26/2014, 6:03 PM

## 2014-08-26 NOTE — Progress Notes (Signed)
No DVT prophylaxis End stage cancer per RN DNR but continued medical care +  No results found for this basename: INR,  in the last 168 hours   Recent Labs Lab 08/21/14 1009 08/26/14 1105  HGB 9.5* 8.9*  HCT 29.4* 27.2*  WBC 11.1* 12.9*  PLT 347 364    Body mass index is 22.07 kg/(m^2).  Plan Sq lovnox q24h 40mg    Dr. Brand Males, M.D., [facility].C.P Pulmonary and Critical Care Medicine Staff Physician [facility] Pager: [phone], If no answer or between  15:00h - 7:00h: call [phone]  08/26/2014 10:51 PM

## 2014-08-26 NOTE — ED Provider Notes (Addendum)
CSN: [id number]     Arrival date & time 08/26/14  1039 History   First MD Initiated Contact with Patient 08/26/14 1059     Chief Complaint  Patient presents with  . Fever     (Consider location/radiation/quality/duration/timing/severity/associated sxs/prior Treatment) HPI Comments: Patient here complaining of a five-day history of worsening fever at home. Temperature last night Peaked to 102. He has had some cough and shortness of breath. He has also had worsening weakness. Patient does have a history of esophageal cancer. He is fed by TPN. No reported history of dysuria or hematuria. No vomiting or diarrhea noted. Symptoms persistent and nothing makes them better or worse. No treatment for this used prior to arrival  Patient is a 80 y.o. male presenting with fever. The history is provided by the patient.  Fever   Past Medical History  Diagnosis Date  . Gout     no flare up in ten years   . Barrett esophagus   . Esophageal cancer 12/22/2012    invasive adenocarcinoma arising in a background of barrett's mucosa  . Diabetes mellitus without complication     type II; last A1C 6.5 10/2012  . GERD (gastroesophageal reflux disease)     managed by prilosec  . Hx of pleural effusion 10/2013    left. pt states it was drained at [facility].    Past Surgical History  Procedure Laterality Date  . Nissen fundoplication    . Tonsillectomy    . Laryngoscopy    . Esophagogastroduodenoscopy  10/18/2012    Procedure: ESOPHAGOGASTRODUODENOSCOPY (EGD);  Surgeon: Winfield Cunas., MD;  Location: [facility];  Service: Endoscopy;  Laterality: N/A;  need xray  . Savory dilation  10/18/2012    Procedure: SAVORY DILATION;  Surgeon: Winfield Cunas., MD;  Location: [facility];  Service: Endoscopy;  Laterality: N/A;  . Esophagogastroduodenoscopy  12/22/2012    Procedure: ESOPHAGOGASTRODUODENOSCOPY (EGD);  Surgeon: Winfield Cunas., MD;  Location: [facility];  Service: Endoscopy;  Laterality: N/A;   with C-Arm  . Savory dilation  12/22/2012    Procedure: SAVORY DILATION;  Surgeon: Winfield Cunas., MD;  Location: [facility];  Service: Endoscopy;  Laterality: N/A;  . Eus  01/10/2013    Procedure: ESOPHAGEAL ENDOSCOPIC ULTRASOUND (EUS) RADIAL;  Surgeon: Arta Silence, MD;  Location: [facility];  Service: Endoscopy;  Laterality: N/A;  . Removal of breast tissue    . Esophagectomy  04/20/2013  . Larynx surgery  11/2013    LARYNGEAL REPAIR   . Cholecystectomy    . Femur im nail Right 05/03/2014    Procedure: INTRAMEDULLARY (IM) NAIL FEMORAL;  Surgeon: Alta Corning, MD;  Location: [facility];  Service: Orthopedics;  Laterality: Right;   Family History  Problem Relation Age of Onset  . Cancer Father     pancreatic cancer; questionable miss dx  . Dementia Father   . Cancer Mother     cervical   History  Substance Use Topics  . Smoking status: Former Smoker    Start date: 12/13/1954    Quit date: 10/19/1983  . Smokeless tobacco: Never Used  . Alcohol Use: No     Comment: stopped drinking in 1985    Review of Systems  Constitutional: Positive for fever.  All other systems reviewed and are negative.     Allergies  Ivp dye  Home Medications   Prior to Admission medications   Medication Sig Start Date End Date Taking? Authorizing Provider  ADULT  TPN Inject into the vein continuous. [facility] - goes for about 14 hours    Historical Provider, MD  ALPRAZolam Duanne Moron) 1 MG tablet Take 1 tablet (1 mg total) by mouth at bedtime as needed for sleep. 07/18/14   Ladell Pier, MD  docusate sodium (COLACE) 100 MG capsule Take 100 mg by mouth 2 (two) times daily as needed for mild constipation.     Historical Provider, MD  ENSURE (ENSURE) Take 237 mLs by mouth 4 (four) times daily as needed (for supplement.  Will use Boost or Ensure.).     Historical Provider, MD  HUMULIN R 100 UNIT/ML injection Inject 20 Units into the skin daily. Added with the TPN 06/27/14   Historical  Provider, MD  ibuprofen (ADVIL,MOTRIN) 200 MG tablet Take 400 mg by mouth 2 (two) times daily as needed for mild pain.     Historical Provider, MD  insulin glargine (LANTUS) 100 UNIT/ML injection Inject 10 Units into the skin at bedtime.    Historical Provider, MD  insulin lispro (HUMALOG) 100 UNIT/ML injection Inject 0-8 Units into the skin daily as needed for high blood sugar. Patient uses a sliding scale from 0-8 units    Historical Provider, MD  lactose free nutrition (BOOST) LIQD Take 237 mLs by mouth 3 (three) times daily between meals.    Historical Provider, MD  lidocaine (XYLOCAINE) 2 % solution  06/07/14   Historical Provider, MD  lidocaine-prilocaine (EMLA) cream Apply 1 application topically as needed (for port).     Historical Provider, MD  loperamide (IMODIUM A-D) 2 MG tablet Take 2 mg by mouth 4 (four) times daily as needed for diarrhea or loose stools.    Historical Provider, MD  mirtazapine (REMERON) 15 MG tablet Take 1 tablet (15 mg total) by mouth at bedtime. 06/06/14   Ladell Pier, MD  Omega-3 Fatty Acids (FISH OIL PO) Take 1 capsule by mouth 2 (two) times daily.    Historical Provider, MD  omeprazole (PRILOSEC) 20 MG capsule Take 20 mg by mouth 2 (two) times daily before a meal.     Historical Provider, MD  ONE TOUCH ULTRA TEST test strip  07/22/14   Historical Provider, MD  oxyCODONE (OXY IR/ROXICODONE) 5 MG immediate release tablet Take 1-3 tablets by mouth every 4 hours as needed 08/21/14   Ladell Pier, MD  OxyCODONE (OXYCONTIN) 20 mg T12A 12 hr tablet Take 1 tablet (20 mg total) by mouth every 12 (twelve) hours. 07/29/14   Ladell Pier, MD  polyethylene glycol [facility] Floria Raveling) packet Take 17 g by mouth daily as needed for mild constipation.     Historical Provider, MD  PRESCRIPTION MEDICATION DOCEtaxel (TAXOTERE) 40 mg in dextrose 5 % 150 mL chemo infusion 20 mg/m2  1.89 m2 (Treatment Plan Actual)  Once 07/24/2014    Historical Provider, MD  prochlorperazine  (COMPAZINE) 5 MG tablet Take 1 tablet (5 mg total) by mouth every 6 (six) hours as needed for nausea or vomiting. 03/12/14   Ladell Pier, MD   BP 104/75  Pulse 128  Temp(Src) 100 F (37.8 C) (Oral)  Resp 22  SpO2 93% Physical Exam  Nursing note and vitals reviewed. Constitutional: He is oriented to person, place, and time. He appears cachectic.  Non-toxic appearance. He has a sickly appearance. He appears ill. No distress.  HENT:  Head: Normocephalic and atraumatic.  Eyes: Conjunctivae, EOM and lids are normal. Pupils are equal, round, and reactive to light.  Neck:  Normal range of motion. Neck supple. No tracheal deviation present. No mass present.  Cardiovascular: Regular rhythm and normal heart sounds.  Tachycardia present.  Exam reveals no gallop.   No murmur heard. Pulmonary/Chest: Effort normal and breath sounds normal. No stridor. No respiratory distress. He has no decreased breath sounds. He has no wheezes. He has no rhonchi. He has no rales.  Abdominal: Soft. Normal appearance and bowel sounds are normal. He exhibits no distension. There is no tenderness. There is no rebound and no CVA tenderness.  Musculoskeletal: Normal range of motion. He exhibits no edema and no tenderness.  Neurological: He is alert and oriented to person, place, and time. No cranial nerve deficit. GCS eye subscore is 4. GCS verbal subscore is 5. GCS motor subscore is 6.  Skin: Skin is warm and dry. No abrasion and no rash noted.  Psychiatric: He has a normal mood and affect. His speech is normal and behavior is normal.    ED Course  Procedures (including critical care time) Labs Review Labs Reviewed  CBC WITH DIFFERENTIAL - Abnormal; Notable for the following:    WBC 12.9 (*)    RBC 2.74 (*)    Hemoglobin 8.9 (*)    HCT 27.2 (*)    All other components within normal limits  CULTURE, BLOOD (ROUTINE X 2)  CULTURE, BLOOD (ROUTINE X 2)  URINE CULTURE  COMPREHENSIVE METABOLIC PANEL  URINALYSIS,  ROUTINE W REFLEX MICROSCOPIC  I-STAT CG4 LACTIC ACID, ED    Imaging Review No results found.   EKG Interpretation   Date/Time:  Monday August 26 2014 11:55:50 EDT Ventricular Rate:  129 PR Interval:  133 QRS Duration: 66 QT Interval:  297 QTC Calculation: 435 R Axis:   54 Text Interpretation:  Sinus tachycardia Confirmed by Zenia Resides  MD, Khamya Topp  ([id number]) on 08/26/2014 12:44:08 PM      MDM   Final diagnoses:  None   CRITICAL CARE Performed by: Leota Jacobsen Total critical care time: 50 Critical care time was exclusive of separately billable procedures and treating other patients. Critical care was necessary to treat or prevent imminent or life-threatening deterioration. Critical care was time spent personally by me on the following activities: development of treatment plan with patient and/or surrogate as well as nursing, discussions with consultants, evaluation of patient's response to treatment, examination of patient, obtaining history from patient or surrogate, ordering and performing treatments and interventions, ordering and review of laboratory studies, ordering and review of radiographic studies, pulse oximetry and re-evaluation of patient's condition.  Patient given IV fluids here and chest x-ray looks like pneumonia. Patient started empirically on antibiotics. Discussed patient's case with his physician and patient to be admitted to step down  Per family request, patient will be a DO NOT RESUSCITATE  Leota Jacobsen, MD 08/26/14 [place], MD 08/26/14 1249

## 2014-08-26 NOTE — ED Notes (Signed)
Bed: WA06 Expected date:  Expected time:  Means of arrival:  Comments: EMS-fever

## 2014-08-26 NOTE — Telephone Encounter (Signed)
Received message from pt's wife stating "he's been having night fevers and this morning his temp is 100.8"  Called and spoke with wife; reports "he is acutely ill, very week, up all night coughing/wheezing."  Offered for pt to be seen in symptom management clinic today by Selena Lesser, NP.  Wife states that would be good but she can't get him in the car; had very hard time this am getting him to bathroom.  Discussed with wife if going to ED would be more appropriate since pt is that acutely ill; very weak.  Pt's wife decided to call 911 to go to ED.  Dr. Benay Spice made aware of situation.

## 2014-08-26 NOTE — Progress Notes (Signed)
Wife wondering if the hospital could use her husband's TNA.  She said that she just received a case of it and would like [place] to use it.  Pharmacy notified and will get back with wife. Wife reports that needle to IV port needs to be changed on 9/15

## 2014-08-26 NOTE — Progress Notes (Signed)
ANTIBIOTIC CONSULT NOTE - INITIAL  Pharmacy Consult for vancomycin Indication: pneumonia/? Right hip infection  Allergies  Allergen Reactions  . Ivp Dye [Iodinated Diagnostic Agents] Swelling    13 hr prep/reaction was 20 years ago per patient    Patient Measurements: Height: 6\' 2"  (188 cm) Weight: 171 lb 15.3 oz (78 kg) IBW/kg (Calculated) : 82.2 Adjusted Body Weight:   Vital Signs: Temp: 99.2 F (37.3 C) (09/14 1351) Temp src: Oral (09/14 1351) BP: 137/65 mmHg (09/14 1800) Pulse Rate: 115 (09/14 1800) Intake/Output from previous day:   Intake/Output from this shift:    Labs:  Recent Labs  08/26/14 1105  WBC 12.9*  HGB 8.9*  PLT 364  CREATININE 0.61   Estimated Creatinine Clearance: 84 ml/min (by C-G formula based on Cr of 0.61). No results found for this basename: VANCOTROUGH, Corlis Leak, VANCORANDOM, [place], GENTPEAK, GENTRANDOM, TOBRATROUGH, TOBRAPEAK, TOBRARND, AMIKACINPEAK, AMIKACINTROU, AMIKACIN,  in the last 72 hours   Microbiology: Recent Results (from the past 720 hour(s))  TECHNOLOGIST REVIEW     Status: None   Collection Time    07/31/14 11:58 AM      Result Value Ref Range Status   Technologist Review     Final   Value: few Metas and Myelocytes present, large & giant plts present    Medical History: Past Medical History  Diagnosis Date  . Gout     no flare up in ten years   . Barrett esophagus   . Esophageal cancer 12/22/2012    invasive adenocarcinoma arising in a background of barrett's mucosa  . Diabetes mellitus without complication     type II; last A1C 6.5 10/2012  . GERD (gastroesophageal reflux disease)     managed by prilosec  . Hx of pleural effusion 10/2013    left. pt states it was drained at [facility].    Assessment: 33 YOM with esophageal cancer presents to ED with SOB, cough, night sweats and fever. Vancomycin and zosyn ordered by physician for pneumonia (?aspiration) and ? R hip infection.  Vancomycin 1gm IV q24h ordered by  physician, but steady state trough from June on 750mg  IV q12h resulted in trough = 68mcg/ml.  Received orders from oncall physician to dose and change to 750mg  IV q12h.   9/14 >> vancomycin  >> 9/14 >>zosyn  >>    Tmax: WBCs: slightly elevated Renal: WNL, Normalized CrCl = 63ml/min (using Scr = 0.8)  9/14 blood: 9/14 urine:   Drug level / dose changes info: 6/28 14:00 VT = 49mcg/ml on 750mg  q12h  9/14: D1 vanc 750mg  q12h/ZEI (per MD  Goal of Therapy:  Vancomycin trough level 15-20 mcg/ml  Plan:   Change vancomycin to 750mg  IV q12h  Check trough at steady steady if remains on vancomycin > 48h  Zosyn dose adjust to extended infusion per policy  Doreene Eland, PharmD, BCPS.   Pager: [phone]  08/26/2014,7:03 PM

## 2014-08-27 ENCOUNTER — Ambulatory Visit: Payer: Medicare PPO | Admitting: Physical Therapy

## 2014-08-27 ENCOUNTER — Ambulatory Visit: Payer: Medicare PPO

## 2014-08-27 ENCOUNTER — Ambulatory Visit (HOSPITAL_COMMUNITY): Payer: Medicare PPO

## 2014-08-27 DIAGNOSIS — R5381 Other malaise: Secondary | ICD-10-CM

## 2014-08-27 DIAGNOSIS — C7951 Secondary malignant neoplasm of bone: Secondary | ICD-10-CM

## 2014-08-27 DIAGNOSIS — J189 Pneumonia, unspecified organism: Secondary | ICD-10-CM

## 2014-08-27 DIAGNOSIS — L539 Erythematous condition, unspecified: Secondary | ICD-10-CM

## 2014-08-27 DIAGNOSIS — R52 Pain, unspecified: Secondary | ICD-10-CM

## 2014-08-27 DIAGNOSIS — E46 Unspecified protein-calorie malnutrition: Secondary | ICD-10-CM

## 2014-08-27 DIAGNOSIS — Z515 Encounter for palliative care: Secondary | ICD-10-CM

## 2014-08-27 DIAGNOSIS — R234 Changes in skin texture: Secondary | ICD-10-CM

## 2014-08-27 DIAGNOSIS — G893 Neoplasm related pain (acute) (chronic): Secondary | ICD-10-CM

## 2014-08-27 DIAGNOSIS — C155 Malignant neoplasm of lower third of esophagus: Secondary | ICD-10-CM

## 2014-08-27 DIAGNOSIS — R5383 Other fatigue: Secondary | ICD-10-CM

## 2014-08-27 DIAGNOSIS — Z66 Do not resuscitate: Secondary | ICD-10-CM

## 2014-08-27 DIAGNOSIS — C7952 Secondary malignant neoplasm of bone marrow: Secondary | ICD-10-CM

## 2014-08-27 MED ORDER — HYDROMORPHONE HCL PF 1 MG/ML IJ SOLN: 0.5000 mg | INTRAMUSCULAR | Status: DC | PRN

## 2014-08-27 MED ORDER — SODIUM CHLORIDE 0.9 % IJ SOLN: 10.0000 mL | INTRAMUSCULAR | Status: DC | PRN

## 2014-08-27 MED ORDER — HYDROMORPHONE HCL PF 1 MG/ML IJ SOLN
0.5000 mg | INTRAMUSCULAR | Status: DC | PRN
  Administered 2014-08-27 – 2014-08-28 (×4): 0.5 mg via INTRAVENOUS
  Filled 2014-08-27 (×4): qty 1

## 2014-08-27 MED ORDER — IPRATROPIUM-ALBUTEROL 0.5-2.5 (3) MG/3ML IN SOLN: 3.0000 mL | RESPIRATORY_TRACT | Status: DC | PRN

## 2014-08-27 MED ORDER — SODIUM CHLORIDE 0.9 % IJ SOLN
10.0000 mL | Freq: Two times a day (BID) | INTRAMUSCULAR | Status: DC
  Administered 2014-08-29 – 2014-08-30 (×2): 10 mL

## 2014-08-27 NOTE — Progress Notes (Signed)
Subjective: Admitted 9/14 c PNA - presumed Aspiration and terminal Esoph CA. Resting and breathing more comfortable than last night. No new c/o. i did not wake pt up.  Spoke to wife.   Objective: Vital signs in last 24 hours: Temp:  [98.5 F (36.9 C)-100 F (37.8 C)] 98.5 F (36.9 C) (09/15 0400) Pulse Rate:  [108-128] 108 (09/15 0615) Resp:  [16-28] 22 (09/15 0615) BP: (104-153)/(55-75) 119/61 mmHg (09/15 0615) SpO2:  [88 %-95 %] 94 % (09/15 0615) Weight:  [78 kg (171 lb 15.3 oz)] 78 kg (171 lb 15.3 oz) (09/14 1500) Weight change:  Last BM Date: 08/26/14  CBG (last 3)  No results found for this basename: GLUCAP,  in the last 72 hours  Intake/Output from previous day:  Intake/Output Summary (Last 24 hours) at 08/27/14 5784 Last data filed at 08/27/14 0000  Gross per 24 hour  Intake    620 ml  Output    425 ml  Net    195 ml   09/14 0701 - 09/15 0700 In: 620 [I.V.:420; IV Piggyback:200] Out: 425 [Urine:425]   Physical Exam General appearance: Looks old and frail. Sleeping Slurred speech. Very weak and frail  Head: Normocephalic, without obvious abnormality, atraumatic  Throat:OP dry and poor dentition  Neck: no adenopathy, no carotid bruit, no JVD and thyroid not enlarged, symmetric, no tenderness/mass/nodules  Resp: + Rhonchi. Using [employer]: Reg Tachy  Chest [facility]  GI: soft, non-tender; bowel sounds normal; no masses, no organomegaly  R Hip is warm with ?fluctuant blister at surgical site  Neurologic: Alert and oriented X 3,VERY WEAK    Lab Results:  Recent Labs  08/26/14 1105  NA 138  K 4.5  CL 98  CO2 28  GLUCOSE 144*  BUN 38*  CREATININE 0.61  CALCIUM 9.7  MG 2.0  PHOS 2.5     Recent Labs  08/26/14 1105  AST 29  ALT 8  ALKPHOS 172*  BILITOT 0.2*  PROT 6.1  ALBUMIN 2.3*     Recent Labs  08/26/14 1105  WBC 12.9*  NEUTROABS 8.2*  HGB 8.9*  HCT 27.2*  MCV 99.3  PLT 364    Lab Results  Component Value Date   INR  1.01 05/30/2014   INR 1.04 05/03/2014   INR 1.00 03/07/2014     Recent Labs  08/26/14 1105  TROPONINI <0.30    No results found for this basename: TSH, T4TOTAL, FREET3, T3FREE, THYROIDAB,  in the last 72 hours  No results found for this basename: VITAMINB12, FOLATE, FERRITIN, TIBC, IRON, RETICCTPCT,  in the last 72 hours  Micro Results: Recent Results (from the past 240 hour(s))  CULTURE, BLOOD (ROUTINE X 2)     Status: None   Collection Time    08/26/14 11:05 AM      Result Value Ref Range Status   Specimen Description BLOOD PORTA CATH   Final   Special Requests BOTTLES DRAWN AEROBIC AND ANAEROBIC 5ML   Final   Culture  Setup Time     Final   Value: 08/26/2014 14:31     Performed at [facility]   Culture     Final   Value: GRAM POSITIVE COCCI IN CLUSTERS     Note: Gram Stain Report Called to,Read Back By and Verified With: HANS JOHNSON AT 1:48 A.M. ON 08/27/14 WARRB     Performed at [facility]   Report Status PENDING   Incomplete  MRSA PCR SCREENING  Status: Abnormal   Collection Time    08/26/14  3:17 PM      Result Value Ref Range Status   MRSA by PCR POSITIVE (*) NEGATIVE Final   Comment:            The GeneXpert MRSA Assay (FDA     approved for NASAL specimens     only), is one component of a     comprehensive MRSA colonization     surveillance program. It is not     intended to diagnose MRSA     infection nor to guide or     monitor treatment for     MRSA infections.     RESULT CALLED TO, READ BACK BY AND VERIFIED WITH:     EARLY,S. @ 1902 ON 9.14.15 BY MCCOY,N.      Studies/Results: Dg Chest Port 1 View  (if Code Sepsis Called)  08/26/2014   CLINICAL DATA:  Fever, review of electronic medical record indicates a history of esophageal cancer  EXAM: PORTABLE CHEST - 1 VIEW  COMPARISON:  08/20/2014 CT, 07/24/2014 chest radiograph  FINDINGS: Right-sided Port-A-Cath in place with tip at the cavoatrial junction. Heart size upper limits of  normal. Increased left pleural effusion is identified, currently small. Trace right pleural effusion is new since the prior study. There are new patchy ill-defined airspace opacities bilaterally with central vascular congestion. Underlying pulmonary nodules are reidentified but poorly visualized as compared to the prior exam.  IMPRESSION: New patchy bilateral airspace opacities in the setting of increased pleural effusions and mild cardiomegaly, most likely edema. However, progression of metastatic disease or other alveolar filling processes could appear similar.   Electronically Signed   By: Conchita Paris M.D.   On: 08/26/2014 11:36     Medications: Scheduled: . antiseptic oral rinse  7 mL Mouth Rinse q12n4p  . chlorhexidine  15 mL Mouth Rinse BID  . Chlorhexidine Gluconate Cloth  6 each Topical Q0600  . enoxaparin (LOVENOX) injection  40 mg Subcutaneous QHS  . ipratropium-albuterol  3 mL Nebulization Q4H  . lactose free nutrition  237 mL Oral TID BM  . mupirocin ointment  1 application Nasal BID  . OxyCODONE  20 mg Oral Q12H  . pantoprazole  40 mg Oral Daily  . piperacillin-tazobactam (ZOSYN)  IV  3.375 g Intravenous 3 times per day  . vancomycin  750 mg Intravenous Q12H   Continuous: . dextrose 5 % and 0.45% NaCl 70 mL/hr at 08/26/14 1847     Assessment/Plan: Active Problems:   Malignant neoplasm of lower third of esophagus   Protein-calorie malnutrition, severe   Pneumonia   Aspiration pneumonia   #1 - clinical PNA vrs Post Obstructive vrs Aspiration c fever/Leukocytosis/cough/Wheeze/hypoxia and infiltrate on CXR - Zosyn/Vanco -Nebs and Pulm toilet - Yonker - DNR, No escalation of care, Allow to eat, use Yonker, treat pain and anxiety.  - they are receptive to Hospice Palliative care consult - Will set up for today.  -  Biologics and TNA on Hold.  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 OxyContin and continue oxycodone Hospice/Palliative Care consult and ultimate transition to Comfort measures only when he  and wife are ready.  #3 Malnutrition, protein-calorie - Will Hold/Stop TNA currently. Very Low Albumin   #4 chronic pain - On Narcs for Rx.   #5 Anxiety Xanax PO and prn IV Ativan ordered.  #6 DIABETES MELLITUS, TYPE II, CONTROLLED  Hgb A1C: 05/30/14 A1C: 6.4% @ Hosp  Humalog SSI and Lantus had been held at home and we are holding here as DM is really not much of an issue for him at this time.   #7 - DVT prophylaxis - Lovenox per E Link order.  #8 R Hip may be infected as well as hip is warm and tender and there is a warm Blister from the surgical site. Vanc ought to help. He is not strong enough for an operation. Follow clinically     ID -  Anti-infectives   Start     Dose/Rate Route Frequency Ordered Stop   08/26/14 2200  piperacillin-tazobactam (ZOSYN) IVPB 3.375 g     3.375 g 12.5 mL/hr over 240 Minutes Intravenous 3 times per day 08/26/14 1815     08/26/14 2200  vancomycin (VANCOCIN) IVPB 750 mg/150 ml premix     750 mg 150 mL/hr over 60 Minutes Intravenous Every 12 hours 08/26/14 1911     08/26/14 1815  piperacillin-tazobactam (ZOSYN) IVPB 3.375 g  Status:  Discontinued     3.375 g 100 mL/hr over 30 Minutes Intravenous 4 times per day 08/26/14 1811 08/26/14 1813   08/26/14 1815  vancomycin (VANCOCIN) IVPB 1000 mg/200 mL premix  Status:  Discontinued     1,000 mg 200 mL/hr over 60 Minutes Intravenous Every 24 hours 08/26/14 1811 08/26/14 1911   08/26/14 1245  piperacillin-tazobactam (ZOSYN) IVPB 3.375 g  Status:  Discontinued     3.375 g 12.5 mL/hr over 240 Minutes Intravenous  Once 08/26/14 1238 08/26/14 1241   08/26/14 1245  vancomycin (VANCOCIN) IVPB 1000 mg/200 mL premix      1,000 mg 200 mL/hr over 60 Minutes Intravenous  Once 08/26/14 1238 08/26/14 1423   08/26/14 1245  piperacillin-tazobactam (ZOSYN) IVPB 3.375 g     3.375 g 100 mL/hr over 30 Minutes Intravenous  Once 08/26/14 1241 08/26/14 1453      LOS: 1 day   Dravyn Severs M 08/27/2014, 6:38 AM

## 2014-08-27 NOTE — Progress Notes (Signed)
[facility]  Patient Status:  Active pt with [name] prior to this admission  [name] is providing the following services: Home Infusion Pharmacy team for home TNA.  See TNA home formula below. [facility] Team will follow and support DC to home when appropriate.    If patient discharges after hours, please call [phone].   Larry Sierras 08/27/2014, 11:00 AM

## 2014-08-27 NOTE — Progress Notes (Addendum)
IP PROGRESS NOTE  Subjective:   Mr. Matheney is well-known to me with a history of metastatic esophagus cancer. He was admitted yesterday with a cough, fever, and dyspnea. He is being treated with broad-spectrum antibiotics after a chest x-ray revealed evidence of pneumonia.  Mr. Sapia is somnolent this morning.  Objective: Vital signs in last 24 hours: Blood pressure 119/61, pulse 108, temperature 98.5 F (36.9 C), temperature source Axillary, resp. rate 22, height 6\' 2"  (1.88 m), weight 171 lb 15.3 oz (78 kg), SpO2 92.00%.  Intake/Output from previous day: 09/14 0701 - 09/15 0700 In: 1160 [I.V.:910; IV Piggyback:250] Out: 462 [Urine:775]  Physical Exam:  HEENT: No thrush Lungs: Good air movement at the anterior chest bilaterally, no respiratory distress Cardiac: Regular rate and rhythm Abdomen: No hepatomegaly, nontender Extremities: No leg edema Skin: There is erythema and induration surrounding the right upper thigh scar with a 1 cm erythematous nodule  Portacath/PICC-without erythema  Lab Results:  Recent Labs  08/26/14 1105  WBC 12.9*  HGB 8.9*  HCT 27.2*  PLT 364    BMET  Recent Labs  08/26/14 1105  NA 138  K 4.5  CL 98  CO2 28  GLUCOSE 144*  BUN 38*  CREATININE 0.61  CALCIUM 9.7    Studies/Results: Dg Chest Port 1 View  (if Code Sepsis Called)  08/26/2014   CLINICAL DATA:  Fever, review of electronic medical record indicates a history of esophageal cancer  EXAM: PORTABLE CHEST - 1 VIEW  COMPARISON:  08/20/2014 CT, 07/24/2014 chest radiograph  FINDINGS: Right-sided Port-A-Cath in place with tip at the cavoatrial junction. Heart size upper limits of normal. Increased left pleural effusion is identified, currently small. Trace right pleural effusion is new since the prior study. There are new patchy ill-defined airspace opacities bilaterally with central vascular congestion. Underlying pulmonary nodules are reidentified but poorly visualized as compared to  the prior exam.  IMPRESSION: New patchy bilateral airspace opacities in the setting of increased pleural effusions and mild cardiomegaly, most likely edema. However, progression of metastatic disease or other alveolar filling processes could appear similar.   Electronically Signed   By: Conchita Paris M.D.   On: 08/26/2014 11:36    Medications: I have reviewed the patient's current medications.  Assessment/Plan:  1. Metastatic adenocarcinoma of the esophagus, initial diagnosis of esophagus cancer in 2014, status post concurrent chemotherapy and radiation completed 02/27/2013.  Status post an esophagogastrectomy 04/20/2013  Recurrent esophagus cancer with multiple hypermetabolic lung nodules and bone lesions March 2015  CAPOX beginning 03/18/2014  Restaging CT 05/17/2014 with progressive bone metastases  Status post pelvic radiation to the left clavicle, T2, and right femur  Initiation of Taxotere 06/19/2014, status post 2 cycles of weekly Taxotere  Palliative radiation to L1 through the sacroiliac joints completed 07/12/2014  Restaging CT 92,015 confirmed disease progression involving multiple bone lesions with soft tissue components  2. pain secondary to metastatic esophagus cancer involving the bones  3. history of a facial MRSA abscess, status post incision and drainage 05/30/2014  4. malnutrition-maintained on TNA prior to this hospital admission  5. admission 08/26/2002 with a fever and cough-chest x-ray consistent with a diagnosis of pneumonia  6. erythema/induration at the right upper thigh-potentially related to an infection or tumor  7. status post placement of a right femur rod 05/03/2012  Mr. Barocio is admitted with a febrile illness in the setting of widely metastatic esophagus cancer. I discussed the poor prognosis with his wife. He is not a candidate  for further systemic therapy. I recommend Hospice care. I agree with discontinuation of the TNA.  Ms. Vecchione would  like to continue antibiotics for a few more days with the hope that his performance status may improve to a point where he will be able to go home with Hospice. We also discussed the possibility of transfer from home to [facility] place. She understands the chief goal at present is to provide comfort.  Recommendations: 1. Continue narcotic analgesics for pain 2. antibiotics per Dr. Virgina Jock 3. consider transferring to the medical floor 4. [facility] referral for home care   LOS: 1 day   [place]  08/27/2014, 8:19 AM  Addendum: Blood culture from 08/26/2012 are positive for gram-positive cocci in clusters. I suspect he has MRSA sepsis, potentially related to a right thigh infection. His performance status may improve partially with continuation of vancomycin.

## 2014-08-27 NOTE — Consult Note (Signed)
Reason for Consult:Mass right hip status post surgery. Referring Physician: Dr. Leroy Sea sherril  Matthew White is an 80 y.o. male.  HPI: The patient is a 80 year old male who had an impending fracture of his right intertrochanteric region.  He was urgently taken to the operating room for intramedullary rod fixation with a second-generation hip nail.  He tolerated the procedure well but always had some significant pain postoperatively.  He was radiated postoperatively.  Unfortunately he developed a small red firm mass over the most proximal suture line.  We were watching this in the office and it did not seem to have any significance.  He is recently been admitted with a questionable septic process and he was concerned that this mass had gotten more red and painful.  We were consulted for evaluation and potential management of this red painful mass.  Past Medical History  Diagnosis Date  . Gout     no flare up in ten years   . Barrett esophagus   . Esophageal cancer 12/22/2012    invasive adenocarcinoma arising in a background of barrett's mucosa  . Diabetes mellitus without complication     type II; last A1C 6.5 10/2012  . GERD (gastroesophageal reflux disease)     managed by prilosec  . Hx of pleural effusion 10/2013    left. pt states it was drained at [facility].     Past Surgical History  Procedure Laterality Date  . Nissen fundoplication    . Tonsillectomy    . Laryngoscopy    . Esophagogastroduodenoscopy  10/18/2012    Procedure: ESOPHAGOGASTRODUODENOSCOPY (EGD);  Surgeon: Winfield Cunas., MD;  Location: [facility];  Service: Endoscopy;  Laterality: N/A;  need xray  . Savory dilation  10/18/2012    Procedure: SAVORY DILATION;  Surgeon: Winfield Cunas., MD;  Location: [facility];  Service: Endoscopy;  Laterality: N/A;  . Esophagogastroduodenoscopy  12/22/2012    Procedure: ESOPHAGOGASTRODUODENOSCOPY (EGD);  Surgeon: Winfield Cunas., MD;  Location: [facility];  Service:  Endoscopy;  Laterality: N/A;  with C-Arm  . Savory dilation  12/22/2012    Procedure: SAVORY DILATION;  Surgeon: Winfield Cunas., MD;  Location: [facility];  Service: Endoscopy;  Laterality: N/A;  . Eus  01/10/2013    Procedure: ESOPHAGEAL ENDOSCOPIC ULTRASOUND (EUS) RADIAL;  Surgeon: Arta Silence, MD;  Location: [facility];  Service: Endoscopy;  Laterality: N/A;  . Removal of breast tissue    . Esophagectomy  04/20/2013  . Larynx surgery  11/2013    LARYNGEAL REPAIR   . Cholecystectomy    . Femur im nail Right 05/03/2014    Procedure: INTRAMEDULLARY (IM) NAIL FEMORAL;  Surgeon: Alta Corning, MD;  Location: [facility];  Service: Orthopedics;  Laterality: Right;    Family History  Problem Relation Age of Onset  . Cancer Father     pancreatic cancer; questionable miss dx  . Dementia Father   . Cancer Mother     cervical    Social History:  reports that he quit smoking about 30 years ago. He started smoking about 59 years ago. He has never used smokeless tobacco. He reports that he does not drink alcohol or use illicit drugs.  Allergies:  Allergies  Allergen Reactions  . Ivp Dye [Iodinated Diagnostic Agents] Swelling    13 hr prep/reaction was 20 years ago per patient    Medications: I have reviewed the patient's current medications.  Results for orders placed during the hospital encounter of  08/26/14 (from the past 48 hour(s))  CULTURE, BLOOD (ROUTINE X 2)     Status: None   Collection Time    08/26/14 10:54 AM      Result Value Ref Range   Specimen Description BLOOD LEFT ANTECUBITAL     Special Requests BOTTLES DRAWN AEROBIC AND ANAEROBIC 5CC EACH     Culture  Setup Time       Value: 08/26/2014 14:31     Performed at [facility]   Culture       Value: [place]     Note: Gram Stain Report Called to,Read Back By and Verified With: JESSICA CONRAD @ 61 ON [id number] BY [facility]     Performed at [facility]   Report Status PENDING     CBC WITH DIFFERENTIAL     Status: Abnormal   Collection Time    08/26/14 11:05 AM      Result Value Ref Range   WBC 12.9 (*) 4.0 - 10.5 K/uL   RBC 2.74 (*) 4.22 - 5.81 MIL/uL   Hemoglobin 8.9 (*) 13.0 - 17.0 g/dL   HCT 27.2 (*) 39.0 - 52.0 %   MCV 99.3  78.0 - 100.0 fL   MCH 32.5  26.0 - 34.0 pg   MCHC 32.7  30.0 - 36.0 g/dL   RDW 15.2  11.5 - 15.5 %   Platelets 364  150 - 400 K/uL   Neutrophils Relative % 64  43 - 77 %   Lymphocytes Relative 21  12 - 46 %   Monocytes Relative 13 (*) 3 - 12 %   Eosinophils Relative 2  0 - 5 %   Basophils Relative 0  0 - 1 %   Neutro Abs 8.2 (*) 1.7 - 7.7 K/uL   Lymphs Abs 2.7  0.7 - 4.0 K/uL   Monocytes Absolute 1.7 (*) 0.1 - 1.0 K/uL   Eosinophils Absolute 0.3  0.0 - 0.7 K/uL   Basophils Absolute 0.0  0.0 - 0.1 K/uL   RBC Morphology POLYCHROMASIA PRESENT     WBC Morphology MILD LEFT SHIFT (1-5% METAS, OCC MYELO, OCC BANDS)    COMPREHENSIVE METABOLIC PANEL     Status: Abnormal   Collection Time    08/26/14 11:05 AM      Result Value Ref Range   Sodium 138  137 - 147 mEq/L   Potassium 4.5  3.7 - 5.3 mEq/L   Chloride 98  96 - 112 mEq/L   CO2 28  19 - 32 mEq/L   Glucose, Bld 144 (*) 70 - 99 mg/dL   BUN 38 (*) 6 - 23 mg/dL   Creatinine, Ser 0.61  0.50 - 1.35 mg/dL   Calcium 9.7  8.4 - 10.5 mg/dL   Total Protein 6.1  6.0 - 8.3 g/dL   Albumin 2.3 (*) 3.5 - 5.2 g/dL   AST 29  0 - 37 U/L   ALT 8  0 - 53 U/L   Alkaline Phosphatase 172 (*) 39 - 117 U/L   Total Bilirubin 0.2 (*) 0.3 - 1.2 mg/dL   GFR calc non Af Amer >90  >90 mL/min   GFR calc Af Amer >90  >90 mL/min   Comment: (NOTE)     The eGFR has been calculated using the CKD EPI equation.     This calculation has not been validated in all clinical situations.     eGFR's persistently <90 mL/min signify possible Chronic Kidney  Disease.   Anion gap 12  5 - 15  CULTURE, BLOOD (ROUTINE X 2)     Status: None   Collection Time    08/26/14 11:05 AM      Result Value Ref Range    Specimen Description BLOOD PORTA CATH     Special Requests BOTTLES DRAWN AEROBIC AND ANAEROBIC 5ML     Culture  Setup Time       Value: 08/26/2014 14:31     Performed at [facility]   Culture       Value: [place]     Note: Gram Stain Report Called to,Read Back By and Verified With: HANS JOHNSON AT 1:48 A.M. ON 08/27/14 WARRB     Performed at [facility]   Report Status PENDING    MAGNESIUM     Status: None   Collection Time    08/26/14 11:05 AM      Result Value Ref Range   Magnesium 2.0  1.5 - 2.5 mg/dL  PHOSPHORUS     Status: None   Collection Time    08/26/14 11:05 AM      Result Value Ref Range   Phosphorus 2.5  2.3 - 4.6 mg/dL  PRO B NATRIURETIC PEPTIDE     Status: Abnormal   Collection Time    08/26/14 11:05 AM      Result Value Ref Range   Pro B Natriuretic peptide (BNP) 612.2 (*) 0 - 450 pg/mL  TROPONIN I     Status: None   Collection Time    08/26/14 11:05 AM      Result Value Ref Range   Troponin I <0.30  <0.30 ng/mL   Comment:            Due to the release kinetics of cTnI,     a negative result within the first hours     of the onset of symptoms does not rule out     myocardial infarction with certainty.     If myocardial infarction is still suspected,     repeat the test at appropriate intervals.  I-STAT CG4 LACTIC ACID, ED     Status: None   Collection Time    08/26/14 11:14 AM      Result Value Ref Range   Lactic Acid, Venous 1.38  0.5 - 2.2 mmol/L  URINALYSIS, ROUTINE W REFLEX MICROSCOPIC     Status: Abnormal   Collection Time    08/26/14 11:25 AM      Result Value Ref Range   Color, Urine YELLOW  YELLOW   APPearance CLOUDY (*) CLEAR   Specific Gravity, Urine 1.016  1.005 - 1.030   pH 7.0  5.0 - 8.0   Glucose, UA NEGATIVE  NEGATIVE mg/dL   Hgb urine dipstick NEGATIVE  NEGATIVE   Bilirubin Urine NEGATIVE  NEGATIVE   Ketones, ur NEGATIVE  NEGATIVE mg/dL   Protein, ur NEGATIVE  NEGATIVE mg/dL   Urobilinogen,  UA 0.2  0.0 - 1.0 mg/dL   Nitrite NEGATIVE  NEGATIVE   Leukocytes, UA NEGATIVE  NEGATIVE   Comment: MICROSCOPIC NOT DONE ON URINES WITH NEGATIVE PROTEIN, BLOOD, LEUKOCYTES, NITRITE, OR GLUCOSE <1000 mg/dL.  URINE CULTURE     Status: None   Collection Time    08/26/14 11:25 AM      Result Value Ref Range   Specimen Description URINE, CLEAN CATCH     Special Requests NONE     Culture  Setup Time  Value: 08/26/2014 15:02     Performed at [place] PENDING     Culture       Value: Culture reincubated for better growth     Performed at [facility]   Report Status PENDING    MRSA PCR SCREENING     Status: Abnormal   Collection Time    08/26/14  3:17 PM      Result Value Ref Range   MRSA by PCR POSITIVE (*) NEGATIVE   Comment:            The GeneXpert MRSA Assay (FDA     approved for NASAL specimens     only), is one component of a     comprehensive MRSA colonization     surveillance program. It is not     intended to diagnose MRSA     infection nor to guide or     monitor treatment for     MRSA infections.     RESULT CALLED TO, READ BACK BY AND VERIFIED WITH:     EARLY,S. @ 1902 ON 9.14.15 BY MCCOY,N.     Dg Chest Port 1 View  (if Code Sepsis Called)  08/26/2014   CLINICAL DATA:  Fever, review of electronic medical record indicates a history of esophageal cancer  EXAM: PORTABLE CHEST - 1 VIEW  COMPARISON:  08/20/2014 CT, 07/24/2014 chest radiograph  FINDINGS: Right-sided Port-A-Cath in place with tip at the cavoatrial junction. Heart size upper limits of normal. Increased left pleural effusion is identified, currently small. Trace right pleural effusion is new since the prior study. There are new patchy ill-defined airspace opacities bilaterally with central vascular congestion. Underlying pulmonary nodules are reidentified but poorly visualized as compared to the prior exam.  IMPRESSION: New patchy bilateral airspace opacities in the setting of  increased pleural effusions and mild cardiomegaly, most likely edema. However, progression of metastatic disease or other alveolar filling processes could appear similar.   Electronically Signed   By: Conchita Paris M.D.   On: 08/26/2014 11:36    ROS ROS: I have reviewed the patient's review of systems thoroughly and there are no positive responses as relates to the HPI. EXAM: Blood pressure 119/61, pulse 108, temperature 100.2 F (37.9 C), temperature source Axillary, resp. rate 22, height 6' 2"  (1.88 m), weight 171 lb 15.3 oz (78 kg), SpO2 92.00%. Physical Exam Well-developed well-nourished patient in no acute distress. Alert and oriented x3 currently on pain medicine and somewhat sedated HEENT:within normal limits Cardiac: Regular rate and rhythm Pulmonary: Lungs clear to auscultation Abdomen: Soft and nontender.  Normal active bowel sounds  Musculoskeletal: (Left hip: Minimal pain to range of motion.  2 x 2 centimeter firm mass over the most proximal hip wound.  It's minimally changed from its previous appearance in the office.  There is some redness around it which may be new. Assessment/Plan: 80 year old male with known metastatic cancer status post surgery for impending fracture right hip with well aligned hardware and good maintenance of position in the office.  He's had a small red mass over the most proximal wound/which is of some concern given that he now has a septic process.//Plan after long discussion with he and his wife we elected to aspirate this mass today.  We got no pus out of this mass.  It appeared to be somewhat of a solid-type mass.  The bleeding little bit but not dramatic.  We did put a sterile dressing over it  but feel that we really have very little else to offer this patient.  We would be happy to see him in the future if things change.  Ninoska Goswick L 08/27/2014, 2:34 PM

## 2014-08-27 NOTE — Consult Note (Signed)
Patient Matthew White      DOB: [birth year]      QAS:[id number]     Consult Note from the Palliative Medicine Team at [facility] Requested by: Dr Virgina Jock     PCP: Precious Reel, MD Reason for Consultation:Clarification of [place] and options     Phone Number:[phone]  Assessment of patients Current state:   Continued physical, functional and cognitive decline 2/2 to metastatic esophageal cancer.  Patient and family faced with advanced directive decisions and anticipatory care needs.  Consult is for review of medical treatment options, clarification of goals of care and end of life issues, disposition and options, and symptom recommendation.  This NP Wadie Lessen reviewed medical records, received report from team, assessed the patient and then meet at the patient's bedside along with his wife Matthew White  to discuss diagnosis prognosis, GOC, EOL wishes disposition and options.  A detailed discussion was had today regarding advanced directives.  Concepts specific to code status, artifical feeding and hydration, continued IV antibiotics and rehospitalization was had.  The difference between a aggressive medical intervention path  and a palliative comfort care path for this patient at this time was had.  Values and goals of care important to patient and family were attempted to be elicited.  Concept of Hospice and Palliative Care were discussed  Natural trajectory and expectations at EOL were discussed.  Questions and concerns addressed.  Hard Choices booklet left for review. Family encouraged to call with questions or concerns.  PMT will continue to support holistically.   Goals of Care: 1.  Code Status:  DNR/DNI-comfort is a priority   2. Scope of Treatment: 1. Vital Signs: per unit  2. Respiratory/Oxygen: as needed for comfort 3. Nutritional Support/Tube Feeds: family still holding on for possible restart of TPN 4. Antibiotics: yes 5. IVF: yes 6. Labs:Yes   3.  Disposition: Watchful waiting over the next 24-48 hrs, wife is hopeful for improvement.   In any case family expresses wish for patient to return home and is open to hospice services at that time.  Will continue to help navigate decisions and options    4. Symptom Management:   1. Anxiety/Agitation: Xanax 0.5-1 mg po qhs prn 2. Pain:  Dilaudid 0.5 mg IV every 3 hrs prn (wife is verbalizing concern that it is the medications affecting his decreased mental status)         -continued scheduled Oxycodone 20 mg every 12 hrs po  5. Psychosocial:  Emotional support offered to wife at bedside.  She verbalizes an understanding of overall poor prognosis but just is not 100% sure that it is the "right time" to shift to a full comfort care approach by de-escalating medical interventions specific to IV fluids, TPN and IV antibiotics  6. Spiritual:  Strong community church support   Patient Documents Completed or Given: Document Given Completed  Advanced Directives Pkt    MOST yes   DNR    Gone from My Sight    Hard Choices yes     Brief HPI:  51 male with  metastatic Esoph CA presently under Dr Geri [place]in[place] care for chemo treatments,  Admitted through the[facility] with night sweats and fever , cough and shortness of breath,  weakness.   coughing/wheezing. EKG showed sinus Tachy. WBC 12.9. BNP elevated at 612. CXR c/w New patchy bilateral airspace opacities in the setting of increased pleural effusions and mild cardiomegaly, most likely edema maybe progression of metastatic disease.  He was started on  Vanco and Zosyn.   Continued decline in spite of interventions  ROS: difficult to illicit, patient is lethargic with garbled speech, c/o intermittent neck and back pain    PMH:  Past Medical History  Diagnosis Date  . Gout     no flare up in ten years   . Barrett esophagus   . Esophageal cancer 12/22/2012    invasive adenocarcinoma arising in a background of barrett's mucosa  . Diabetes mellitus without  complication     type II; last A1C 6.5 10/2012  . GERD (gastroesophageal reflux disease)     managed by prilosec  . Hx of pleural effusion 10/2013    left. pt states it was drained at [facility].      PSH: Past Surgical History  Procedure Laterality Date  . Nissen fundoplication    . Tonsillectomy    . Laryngoscopy    . Esophagogastroduodenoscopy  10/18/2012    Procedure: ESOPHAGOGASTRODUODENOSCOPY (EGD);  Surgeon: Winfield Cunas., MD;  Location: [facility];  Service: Endoscopy;  Laterality: N/A;  need xray  . Savory dilation  10/18/2012    Procedure: SAVORY DILATION;  Surgeon: Winfield Cunas., MD;  Location: [facility];  Service: Endoscopy;  Laterality: N/A;  . Esophagogastroduodenoscopy  12/22/2012    Procedure: ESOPHAGOGASTRODUODENOSCOPY (EGD);  Surgeon: Winfield Cunas., MD;  Location: [facility];  Service: Endoscopy;  Laterality: N/A;  with C-Arm  . Savory dilation  12/22/2012    Procedure: SAVORY DILATION;  Surgeon: Winfield Cunas., MD;  Location: [facility];  Service: Endoscopy;  Laterality: N/A;  . Eus  01/10/2013    Procedure: ESOPHAGEAL ENDOSCOPIC ULTRASOUND (EUS) RADIAL;  Surgeon: Arta Silence, MD;  Location: [facility];  Service: Endoscopy;  Laterality: N/A;  . Removal of breast tissue    . Esophagectomy  04/20/2013  . Larynx surgery  11/2013    LARYNGEAL REPAIR   . Cholecystectomy    . Femur im nail Right 05/03/2014    Procedure: INTRAMEDULLARY (IM) NAIL FEMORAL;  Surgeon: Alta Corning, MD;  Location: [facility];  Service: Orthopedics;  Laterality: Right;   I have reviewed the [place] and SH and  If appropriate update it with new information. Allergies  Allergen Reactions  . Ivp Dye [Iodinated Diagnostic Agents] Swelling    13 hr prep/reaction was 20 years ago per patient   Scheduled Meds: . antiseptic oral rinse  7 mL Mouth Rinse q12n4p  . chlorhexidine  15 mL Mouth Rinse BID  . Chlorhexidine Gluconate Cloth  6 each Topical Q0600  . enoxaparin (LOVENOX)  injection  40 mg Subcutaneous QHS  . lactose free nutrition  237 mL Oral TID BM  . mupirocin ointment  1 application Nasal BID  . OxyCODONE  20 mg Oral Q12H  . pantoprazole  40 mg Oral Daily  . piperacillin-tazobactam (ZOSYN)  IV  3.375 g Intravenous 3 times per day  . vancomycin  750 mg Intravenous Q12H   Continuous Infusions: . dextrose 5 % and 0.45% NaCl 70 mL/hr at 08/27/14 1009   PRN Meds:.acetaminophen, acetaminophen, ALPRAZolam, docusate sodium, HYDROmorphone (DILAUDID) injection, ibuprofen, ipratropium-albuterol, lidocaine-prilocaine, LORazepam, ondansetron (ZOFRAN) IV, ondansetron, oxyCODONE, polyethylene glycol, prochlorperazine    BP 119/61  Pulse 108  Temp(Src) 100.2 F (37.9 C) (Axillary)  Resp 22  Ht 6\' 2"  (1.88 m)  Wt 78 kg (171 lb 15.3 oz)  BMI 22.07 kg/m2  SpO2 92%   PPS:20 %   Intake/Output Summary (Last 24 hours)  at 08/27/14 1434 Last data filed at 08/27/14 0700  Gross per 24 hour  Intake   1160 ml  Output    775 ml  Net    385 ml    Physical Exam:  General:  Ill appearing, lethargic, HEENT:  Moist buccal membranes  Chest:   Decreased in bases CTA CVS: tachycardic Abdomen:soft NT +BS Ext: without edema Neuro: lethargic, left sided weakness, arouses to verbal stimuli   Labs: CBC    Component Value Date/Time   WBC 12.9* 08/26/2014 1105   WBC 11.1* 08/21/2014 1009   RBC 2.74* 08/26/2014 1105   RBC 2.92* 08/21/2014 1009   RBC 2.16* 07/25/2014 1626   HGB 8.9* 08/26/2014 1105   HGB 9.5* 08/21/2014 1009   HCT 27.2* 08/26/2014 1105   HCT 29.4* 08/21/2014 1009   PLT 364 08/26/2014 1105   PLT 347 08/21/2014 1009   MCV 99.3 08/26/2014 1105   MCV 100.6* 08/21/2014 1009   MCH 32.5 08/26/2014 1105   MCH 32.6 08/21/2014 1009   MCHC 32.7 08/26/2014 1105   MCHC 32.4 08/21/2014 1009   RDW 15.2 08/26/2014 1105   RDW 15.6* 08/21/2014 1009   LYMPHSABS 2.7 08/26/2014 1105   LYMPHSABS 3.0 08/21/2014 1009   MONOABS 1.7* 08/26/2014 1105   MONOABS 1.6* 08/21/2014 1009   EOSABS 0.3  08/26/2014 1105   EOSABS 0.2 08/21/2014 1009   BASOSABS 0.0 08/26/2014 1105   BASOSABS 0.1 08/21/2014 1009    BMET    Component Value Date/Time   NA 138 08/26/2014 1105   NA 139 08/21/2014 1014   K 4.5 08/26/2014 1105   K 4.1 08/21/2014 1014   CL 98 08/26/2014 1105   CL 104 03/12/2013 1507   CO2 28 08/26/2014 1105   CO2 24 08/21/2014 1014   GLUCOSE 144* 08/26/2014 1105   GLUCOSE 123 08/21/2014 1014   GLUCOSE 135* 03/12/2013 1507   BUN 38* 08/26/2014 1105   BUN 44.9* 08/21/2014 1014   CREATININE 0.61 08/26/2014 1105   CREATININE 0.7 08/21/2014 1014   CALCIUM 9.7 08/26/2014 1105   CALCIUM 9.5 08/21/2014 1014   GFRNONAA >90 08/26/2014 1105   GFRAA >90 08/26/2014 1105    CMP     Component Value Date/Time   NA 138 08/26/2014 1105   NA 139 08/21/2014 1014   K 4.5 08/26/2014 1105   K 4.1 08/21/2014 1014   CL 98 08/26/2014 1105   CL 104 03/12/2013 1507   CO2 28 08/26/2014 1105   CO2 24 08/21/2014 1014   GLUCOSE 144* 08/26/2014 1105   GLUCOSE 123 08/21/2014 1014   GLUCOSE 135* 03/12/2013 1507   BUN 38* 08/26/2014 1105   BUN 44.9* 08/21/2014 1014   CREATININE 0.61 08/26/2014 1105   CREATININE 0.7 08/21/2014 1014   CALCIUM 9.7 08/26/2014 1105   CALCIUM 9.5 08/21/2014 1014   PROT 6.1 08/26/2014 1105   PROT 6.2* 08/21/2014 1014   ALBUMIN 2.3* 08/26/2014 1105   ALBUMIN 2.3* 08/21/2014 1014   AST 29 08/26/2014 1105   AST 38* 08/21/2014 1014   ALT 8 08/26/2014 1105   ALT 11 08/21/2014 1014   ALKPHOS 172* 08/26/2014 1105   ALKPHOS 144 08/21/2014 1014   BILITOT 0.2* 08/26/2014 1105   BILITOT 0.31 08/21/2014 1014   GFRNONAA >90 08/26/2014 1105   GFRAA >90 08/26/2014 1105     Time In Time Out Total Time Spent with Patient Total Overall Time  1300 1430 80 min 90 min    Greater than 50%  of  this time was spent counseling and coordinating care related to the above assessment and plan.   Wadie Lessen NP  Palliative Medicine Team Team Phone # [phone] Pager [phone]  Discussed with Dr Learta Codding and Dr Virgina Jock

## 2014-08-27 NOTE — Progress Notes (Signed)
Utilization review completed.  

## 2014-08-28 DIAGNOSIS — R531 Weakness: Secondary | ICD-10-CM

## 2014-08-28 DIAGNOSIS — R52 Pain, unspecified: Secondary | ICD-10-CM

## 2014-08-28 DIAGNOSIS — Z515 Encounter for palliative care: Secondary | ICD-10-CM

## 2014-08-28 DIAGNOSIS — Z66 Do not resuscitate: Secondary | ICD-10-CM

## 2014-08-28 MED ORDER — POLYETHYLENE GLYCOL 3350 17 G PO PACK
17.0000 g | PACK | Freq: Every day | ORAL | Status: DC | PRN
  Filled 2014-08-28: qty 1

## 2014-08-28 MED ORDER — MORPHINE SULFATE 2 MG/ML IJ SOLN
1.0000 mg | INTRAMUSCULAR | Status: DC | PRN
  Administered 2014-08-28 – 2014-08-29 (×4): 1 mg via INTRAVENOUS
  Filled 2014-08-28 (×5): qty 1

## 2014-08-28 NOTE — Progress Notes (Signed)
Progress Note from the Palliative Medicine Team at [facility]:  -patient is lethargic, does respond appropriately to simple questions, wife at bedside  -continued conversation regarding natural trajectory and expectations at EOL, the commonality of infection at EOL,  and the natural process of dehydration at EOL   Objective: Allergies  Allergen Reactions  . Ivp Dye [Iodinated Diagnostic Agents] Swelling    13 hr prep/reaction was 20 years ago per patient   Scheduled Meds: . antiseptic oral rinse  7 mL Mouth Rinse q12n4p  . chlorhexidine  15 mL Mouth Rinse BID  . Chlorhexidine Gluconate Cloth  6 each Topical Q0600  . enoxaparin (LOVENOX) injection  40 mg Subcutaneous QHS  . lactose free nutrition  237 mL Oral TID BM  . mupirocin ointment  1 application Nasal BID  . OxyCODONE  20 mg Oral Q12H  . pantoprazole  40 mg Oral Daily  . piperacillin-tazobactam (ZOSYN)  IV  3.375 g Intravenous 3 times per day  . sodium chloride  10-40 mL Intracatheter Q12H  . vancomycin  750 mg Intravenous Q12H   Continuous Infusions: . dextrose 5 % and 0.45% NaCl 70 mL/hr at 08/27/14 1900   PRN Meds:.acetaminophen, acetaminophen, ALPRAZolam, docusate sodium, HYDROmorphone (DILAUDID) injection, ibuprofen, ipratropium-albuterol, lidocaine-prilocaine, LORazepam, ondansetron (ZOFRAN) IV, ondansetron, oxyCODONE, polyethylene glycol, prochlorperazine, sodium chloride  BP 156/86  Pulse 125  Temp(Src) 100.2 F (37.9 C) (Axillary)  Resp 17  Ht 6\' 2"  (1.88 m)  Wt 80.7 kg (177 lb 14.6 oz)  BMI 22.83 kg/m2  SpO2 93%   PPS: 20 %    Intake/Output Summary (Last 24 hours) at 08/28/14 1352 Last data filed at 08/28/14 1054  Gross per 24 hour  Intake   1280 ml  Output   1950 ml  Net   -670 ml       Physical Exam:  General: Ill appearing, lethargic,  HEENT:  Dry  buccal membranes, large amt of mucous secretions, unable to clear due to weakness Chest: Decreased in bases CTA, scattered rhonchi   CVS: tachycardic  Abdomen:soft NT +BS  Ext: without edema  Neuro: lethargic, left sided weakness, arouses to verbal stimuli      Labs: CBC    Component Value Date/Time   WBC 12.9* 08/26/2014 1105   WBC 11.1* 08/21/2014 1009   RBC 2.74* 08/26/2014 1105   RBC 2.92* 08/21/2014 1009   RBC 2.16* 07/25/2014 1626   HGB 8.9* 08/26/2014 1105   HGB 9.5* 08/21/2014 1009   HCT 27.2* 08/26/2014 1105   HCT 29.4* 08/21/2014 1009   PLT 364 08/26/2014 1105   PLT 347 08/21/2014 1009   MCV 99.3 08/26/2014 1105   MCV 100.6* 08/21/2014 1009   MCH 32.5 08/26/2014 1105   MCH 32.6 08/21/2014 1009   MCHC 32.7 08/26/2014 1105   MCHC 32.4 08/21/2014 1009   RDW 15.2 08/26/2014 1105   RDW 15.6* 08/21/2014 1009   LYMPHSABS 2.7 08/26/2014 1105   LYMPHSABS 3.0 08/21/2014 1009   MONOABS 1.7* 08/26/2014 1105   MONOABS 1.6* 08/21/2014 1009   EOSABS 0.3 08/26/2014 1105   EOSABS 0.2 08/21/2014 1009   BASOSABS 0.0 08/26/2014 1105   BASOSABS 0.1 08/21/2014 1009    BMET    Component Value Date/Time   NA 138 08/26/2014 1105   NA 139 08/21/2014 1014   K 4.5 08/26/2014 1105   K 4.1 08/21/2014 1014   CL 98 08/26/2014 1105   CL 104 03/12/2013 1507   CO2 28 08/26/2014 1105   CO2  24 08/21/2014 1014   GLUCOSE 144* 08/26/2014 1105   GLUCOSE 123 08/21/2014 1014   GLUCOSE 135* 03/12/2013 1507   BUN 38* 08/26/2014 1105   BUN 44.9* 08/21/2014 1014   CREATININE 0.61 08/26/2014 1105   CREATININE 0.7 08/21/2014 1014   CALCIUM 9.7 08/26/2014 1105   CALCIUM 9.5 08/21/2014 1014   GFRNONAA >90 08/26/2014 1105   GFRAA >90 08/26/2014 1105    CMP     Component Value Date/Time   NA 138 08/26/2014 1105   NA 139 08/21/2014 1014   K 4.5 08/26/2014 1105   K 4.1 08/21/2014 1014   CL 98 08/26/2014 1105   CL 104 03/12/2013 1507   CO2 28 08/26/2014 1105   CO2 24 08/21/2014 1014   GLUCOSE 144* 08/26/2014 1105   GLUCOSE 123 08/21/2014 1014   GLUCOSE 135* 03/12/2013 1507   BUN 38* 08/26/2014 1105   BUN 44.9* 08/21/2014 1014   CREATININE 0.61 08/26/2014 1105   CREATININE 0.7 08/21/2014 1014    CALCIUM 9.7 08/26/2014 1105   CALCIUM 9.5 08/21/2014 1014   PROT 6.1 08/26/2014 1105   PROT 6.2* 08/21/2014 1014   ALBUMIN 2.3* 08/26/2014 1105   ALBUMIN 2.3* 08/21/2014 1014   AST 29 08/26/2014 1105   AST 38* 08/21/2014 1014   ALT 8 08/26/2014 1105   ALT 11 08/21/2014 1014   ALKPHOS 172* 08/26/2014 1105   ALKPHOS 144 08/21/2014 1014   BILITOT 0.2* 08/26/2014 1105   BILITOT 0.31 08/21/2014 1014   GFRNONAA >90 08/26/2014 1105   GFRAA >90 08/26/2014 1105     Assessment and Plan: 1. Code Status: DNR/DNI  2. Symptom Control:        Pain/Dyspnea: Morphine 1 mg IV every 2 hrs prn/ dc ER Oxycodone        Anxiety:  Ativan 0.5  mg IV prn every 4 hrs prn  3. Psycho/Social:  Emotional support offered to wife at bedside.  She has discussed with three of her children the continued decline, she plans to let her son (on his honey moon) know the situation.  I continue to help and  educate wife to  understand the limited prognosis regardless of medical interventions.  She is unable at this time to shift to a full comfort path at this time.  4. Spiritual  Strong community church support  5. Disposition:  Dependant on outcomes.  Wife verbalizes desire to take him home with IV antibiotics and TPN.  She is open to hospice services.    Patient Documents Completed or Given: Document Given Completed  Advanced Directives Pkt    MOST yes   DNR    Gone from My Sight    Hard Choices yes     Time In Time Out Total Time Spent with Patient Total Overall Time  1300 1400 60 min 60 min    Greater than 50%  of this time was spent counseling and coordinating care related to the above assessment and plan.  Wadie Lessen NP  Palliative Medicine Team Team Phone # [phone] Pager [phone]   1

## 2014-08-28 NOTE — Progress Notes (Signed)
Chaplain attempted consult with pt due to palliative order on 9/15.  Pt's spouse not present.  Pt being moved to 1329.  Will continue to follow on floor to assess for support.

## 2014-08-28 NOTE — Progress Notes (Signed)
Subjective: Admitted 9/14 c PNA - presumed Aspiration and terminal Esoph CA. Resting and breathing more comfortable with current Rxs. Seen by Dr Berenice Primas, Dr Benay Spice and hospice/Palliative care team. TMax 101.1 No new c/o. I Woke him up - he was comfortable.  Upon Wakening he started to cough and have more issues   Objective: Vital signs in last 24 hours: Temp:  [99 F (37.2 C)-101.1 F (38.4 C)] 99.7 F (37.6 C) (09/16 0400) Pulse Rate:  [108-122] 108 (09/16 0410) Resp:  [17-21] 21 (09/16 0410) BP: (106-140)/(52-66) 114/52 mmHg (09/16 0410) SpO2:  [84 %-97 %] 94 % (09/16 0410) Weight:  [80.7 kg (177 lb 14.6 oz)] 80.7 kg (177 lb 14.6 oz) (09/16 0400) Weight change: 2.7 kg (5 lb 15.2 oz) Last BM Date: 08/26/14  CBG (last 3)  No results found for this basename: GLUCAP,  in the last 72 hours  Intake/Output from previous day:  Intake/Output Summary (Last 24 hours) at 08/28/14 0738 Last data filed at 08/28/14 3329  Gross per 24 hour  Intake   1220 ml  Output   1550 ml  Net   -330 ml   09/15 0701 - 09/16 0700 In: 5188 [I.V.:770; IV Piggyback:450] Out: 4166 [Urine:1550]   Physical Exam General appearance: Looks old and frail.  Slurred speech. Very weak and frail  Head: Normocephalic, without obvious abnormality, atraumatic  Throat:OP dry and poor dentition  Neck: no adenopathy, no carotid bruit, no JVD and thyroid not enlarged, symmetric, no tenderness/mass/nodules  Resp: + Rhonchi. Using [employer]: Reg Tachy  Chest [facility] CDI  GI: soft, non-tender; bowel sounds normal; no masses, no organomegaly  R Hip is warm with blister at surgical site  Neurologic: Alert and oriented X 3,VERY WEAK    Lab Results:  Recent Labs  08/26/14 1105  NA 138  K 4.5  CL 98  CO2 28  GLUCOSE 144*  BUN 38*  CREATININE 0.61  CALCIUM 9.7  MG 2.0  PHOS 2.5     Recent Labs  08/26/14 1105  AST 29  ALT 8  ALKPHOS 172*  BILITOT 0.2*  PROT 6.1  ALBUMIN 2.3*     Recent  Labs  08/26/14 1105  WBC 12.9*  NEUTROABS 8.2*  HGB 8.9*  HCT 27.2*  MCV 99.3  PLT 364    Lab Results  Component Value Date   INR 1.01 05/30/2014   INR 1.04 05/03/2014   INR 1.00 03/07/2014     Recent Labs  08/26/14 1105  TROPONINI <0.30    No results found for this basename: TSH, T4TOTAL, FREET3, T3FREE, THYROIDAB,  in the last 72 hours  No results found for this basename: VITAMINB12, FOLATE, FERRITIN, TIBC, IRON, RETICCTPCT,  in the last 72 hours  Micro Results: Recent Results (from the past 240 hour(s))  CULTURE, BLOOD (ROUTINE X 2)     Status: None   Collection Time    08/26/14 10:54 AM      Result Value Ref Range Status   Specimen Description BLOOD LEFT ANTECUBITAL   Final   Special Requests BOTTLES DRAWN AEROBIC AND ANAEROBIC [facility] EACH   Final   Culture  Setup Time     Final   Value: 08/26/2014 14:31     Performed at [facility]   Culture     Final   Value: GRAM POSITIVE COCCI IN CLUSTERS     Note: Gram Stain Report Called to,Read Back By and Verified With: JESSICA CONRAD @ [place] ON [id number] BY [facility]  Performed at [facility]   Report Status PENDING   Incomplete  CULTURE, BLOOD (ROUTINE X 2)     Status: None   Collection Time    08/26/14 11:05 AM      Result Value Ref Range Status   Specimen Description BLOOD PORTA CATH   Final   Special Requests BOTTLES DRAWN AEROBIC AND ANAEROBIC 5ML   Final   Culture  Setup Time     Final   Value: 08/26/2014 14:31     Performed at [facility]   Culture     Final   Value: GRAM POSITIVE COCCI IN CLUSTERS     Note: Gram Stain Report Called to,Read Back By and Verified With: HANS JOHNSON AT 1:48 A.M. ON 08/27/14 WARRB     Performed at [facility]   Report Status PENDING   Incomplete  URINE CULTURE     Status: None   Collection Time    08/26/14 11:25 AM      Result Value Ref Range Status   Specimen Description URINE, CLEAN CATCH   Final   Special Requests NONE   Final   Culture  Setup  Time     Final   Value: 08/26/2014 15:02     Performed at [place]     Final   Value: 35,000 COLONIES/ML     Performed at [facility]   Culture     Final   Value: ESCHERICHIA COLI     Performed at [facility]   Report Status PENDING   Incomplete  MRSA PCR SCREENING     Status: Abnormal   Collection Time    08/26/14  3:17 PM      Result Value Ref Range Status   MRSA by PCR POSITIVE (*) NEGATIVE Final   Comment:            The GeneXpert MRSA Assay (FDA     approved for NASAL specimens     only), is one component of a     comprehensive MRSA colonization     surveillance program. It is not     intended to diagnose MRSA     infection nor to guide or     monitor treatment for     MRSA infections.     RESULT CALLED TO, READ BACK BY AND VERIFIED WITH:     EARLY,S. @ 1902 ON 9.14.15 BY MCCOY,N.      Studies/Results: Dg Chest Port 1 View  (if Code Sepsis Called)  08/26/2014   CLINICAL DATA:  Fever, review of electronic medical record indicates a history of esophageal cancer  EXAM: PORTABLE CHEST - 1 VIEW  COMPARISON:  08/20/2014 CT, 07/24/2014 chest radiograph  FINDINGS: Right-sided Port-A-Cath in place with tip at the cavoatrial junction. Heart size upper limits of normal. Increased left pleural effusion is identified, currently small. Trace right pleural effusion is new since the prior study. There are new patchy ill-defined airspace opacities bilaterally with central vascular congestion. Underlying pulmonary nodules are reidentified but poorly visualized as compared to the prior exam.  IMPRESSION: New patchy bilateral airspace opacities in the setting of increased pleural effusions and mild cardiomegaly, most likely edema. However, progression of metastatic disease or other alveolar filling processes could appear similar.   Electronically Signed   By: Conchita Paris M.D.   On: 08/26/2014 11:36     Medications: Scheduled: . antiseptic oral  rinse  7 mL Mouth Rinse q12n4p  .  chlorhexidine  15 mL Mouth Rinse BID  . Chlorhexidine Gluconate Cloth  6 each Topical Q0600  . enoxaparin (LOVENOX) injection  40 mg Subcutaneous QHS  . lactose free nutrition  237 mL Oral TID BM  . mupirocin ointment  1 application Nasal BID  . OxyCODONE  20 mg Oral Q12H  . pantoprazole  40 mg Oral Daily  . piperacillin-tazobactam (ZOSYN)  IV  3.375 g Intravenous 3 times per day  . sodium chloride  10-40 mL Intracatheter Q12H  . vancomycin  750 mg Intravenous Q12H   Continuous: . dextrose 5 % and 0.45% NaCl 70 mL/hr at 08/27/14 1900     Assessment/Plan: Active Problems:   Malignant neoplasm of lower third of esophagus   Protein-calorie malnutrition, severe   Pneumonia   Aspiration pneumonia   #1 - clinical PNA vrs Post Obstructive vrs Aspiration c fever/Leukocytosis/cough/Wheeze/hypoxia and infiltrate on CXR ---- GPC in clusters + Bacteremia - Presumed MRSA as he also swabbed + for MRSA - Even though thigh blister drainage was mostly bloody - ? Abscess - Continue  Zosyn/Vanco - Nebs and Pulm toilet - Yonker - DNR, No escalation of care, Allow to eat, use Yonker, treat pain and anxiety.  Transfer to floor today  - Appreciate Hospice Palliative care consult -  -  Biologics and TNA on Hold.  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 OxyContin and continue oxycodone, IV Narcs and antianxiety meds also on order Hospice/Palliative Care consult and ultimate transition to Comfort measures only when he and wife are ready.  #3 Malnutrition, protein-calorie - Will Hold/Stop TNA currently. Very Low Albumin   #4 chronic pain - On Narcs for Rx.   #5 Anxiety Xanax PO and prn IV Ativan  ordered.  #6 DIABETES MELLITUS, TYPE II, CONTROLLED.  CBGs may be high c D5 but does not matter  Hgb A1C: 05/30/14 A1C: 6.4% @ Hosp  Humalog SSI and Lantus had been held at home and we are holding here as DM is really not much of an issue for him at this time.   #7 - DVT prophylaxis - Lovenox per E Link order.  #8 R Hip may be infected as well as hip is warm and tender and there is a warm Blister from the surgical site. Vanc ought to help. He is not strong enough for an operation. S/P Drainage of blood.  Follow clinically   #9 - Transfer to floor.  repeat CXR and labs in am.  If we show that he is not improving they may be more amenable to pursue comfort measures only.    ID -  Anti-infectives   Start     Dose/Rate Route Frequency Ordered Stop   08/26/14 2200  piperacillin-tazobactam (ZOSYN) IVPB 3.375 g     3.375 g 12.5 mL/hr over 240 Minutes Intravenous 3 times per day 08/26/14 1815     08/26/14 2200  vancomycin (VANCOCIN) IVPB 750 mg/150 ml premix     750 mg 150 mL/hr over 60 Minutes Intravenous Every 12 hours 08/26/14 1911     08/26/14 1815  piperacillin-tazobactam (ZOSYN) IVPB 3.375 g  Status:  Discontinued     3.375 g 100  mL/hr over 30 Minutes Intravenous 4 times per day 08/26/14 1811 08/26/14 1813   08/26/14 1815  vancomycin (VANCOCIN) IVPB 1000 mg/200 mL premix  Status:  Discontinued     1,000 mg 200 mL/hr over 60 Minutes Intravenous Every 24 hours 08/26/14 1811 08/26/14 1911   08/26/14 1245  piperacillin-tazobactam (ZOSYN) IVPB 3.375 g  Status:  Discontinued     3.375 g 12.5 mL/hr over 240 Minutes Intravenous  Once 08/26/14 1238 08/26/14 1241   08/26/14 1245  vancomycin (VANCOCIN) IVPB 1000 mg/200 mL premix     1,000 mg 200 mL/hr over 60 Minutes Intravenous  Once 08/26/14 1238 08/26/14 1423   08/26/14 1245  piperacillin-tazobactam (ZOSYN) IVPB 3.375 g     3.375 g 100 mL/hr over 30 Minutes Intravenous  Once 08/26/14 1241 08/26/14 1453      LOS: 2 days   Jolynda Townley  M 08/28/2014, 7:38 AM

## 2014-08-28 NOTE — Clinical Documentation Improvement (Signed)
MD's, NP's, PA's  Patient admitted WBC at 12.9, Pulse (112-119), Resp (20-28), and diagnosis of Pneumonia, blood cx growing "GRAM POSITIVE COCCI IN CLUSTERS " .  Orthopedic note stating patient has "septic process".  Was either of the following conditions present on admission . Thank you     Possible Clinical Conditions?    Sepsis/Septicemia  Neutropenic Sepsis  Bacteremia                                  Other Condition                  Cannot Clinically Determine   Treatment: IV Vancomycin  Thank You, Ree Kida ,RN Clinical Documentation Specialist:  [phone]  [facility] Information Management

## 2014-08-29 ENCOUNTER — Inpatient Hospital Stay (HOSPITAL_COMMUNITY): Payer: Medicare PPO

## 2014-08-29 ENCOUNTER — Encounter: Payer: Medicare PPO | Admitting: Physical Therapy

## 2014-08-29 LAB — CBC
HCT: 28.2 % — ABNORMAL LOW (ref 39.0–52.0)
Hemoglobin: 9 g/dL — ABNORMAL LOW (ref 13.0–17.0)
MCH: 32 pg (ref 26.0–34.0)
MCHC: 31.9 g/dL (ref 30.0–36.0)
MCV: 100.4 fL — AB (ref 78.0–100.0)
Platelets: 299 10*3/uL (ref 150–400)
RBC: 2.81 MIL/uL — ABNORMAL LOW (ref 4.22–5.81)
RDW: 15.1 % (ref 11.5–15.5)
WBC: 13 10*3/uL — ABNORMAL HIGH (ref 4.0–10.5)

## 2014-08-29 LAB — CULTURE, BLOOD (ROUTINE X 2)

## 2014-08-29 LAB — BASIC METABOLIC PANEL
Anion gap: 11 (ref 5–15)
BUN: 18 mg/dL (ref 6–23)
CO2: 28 mEq/L (ref 19–32)
CREATININE: 0.69 mg/dL (ref 0.50–1.35)
Calcium: 9.8 mg/dL (ref 8.4–10.5)
Chloride: 100 mEq/L (ref 96–112)
GFR, EST NON AFRICAN AMERICAN: 89 mL/min — AB (ref >90)
Glucose, Bld: 110 mg/dL — ABNORMAL HIGH (ref 70–99)
Potassium: 3.9 mEq/L (ref 3.7–5.3)
Sodium: 139 mEq/L (ref 137–147)

## 2014-08-29 LAB — URINE CULTURE: Colony Count: 35000

## 2014-08-29 LAB — VANCOMYCIN, TROUGH: Vancomycin Tr: 14.5 ug/mL (ref 10.0–20.0)

## 2014-08-29 MED ORDER — HYDROMORPHONE HCL 1 MG/ML IJ SOLN: 0.5000 mg | Freq: Every evening | INTRAMUSCULAR | Status: DC | PRN

## 2014-08-29 MED ORDER — DEXTROSE 5 % IV SOLN
1.0000 g | Freq: Two times a day (BID) | INTRAVENOUS | Status: DC
  Administered 2014-08-29 – 2014-08-31 (×4): 1 g via INTRAVENOUS
  Filled 2014-08-29 (×4): qty 1

## 2014-08-29 MED ORDER — HYDROMORPHONE HCL 1 MG/ML IJ SOLN
0.5000 mg | Freq: Once | INTRAMUSCULAR | Status: AC
  Administered 2014-08-29: 0.5 mg via INTRAVENOUS
  Filled 2014-08-29: qty 1

## 2014-08-29 MED ORDER — MORPHINE SULFATE (CONCENTRATE) 10 MG /0.5 ML PO SOLN
5.0000 mg | ORAL | Status: DC | PRN
  Administered 2014-08-29 – 2014-08-31 (×10): 5 mg via ORAL
  Filled 2014-08-29 (×11): qty 0.5

## 2014-08-29 MED ORDER — PANTOPRAZOLE SODIUM 40 MG IV SOLR
40.0000 mg | Freq: Every day | INTRAVENOUS | Status: DC
  Administered 2014-08-29 – 2014-08-31 (×3): 40 mg via INTRAVENOUS
  Filled 2014-08-29 (×3): qty 40

## 2014-08-29 NOTE — Progress Notes (Signed)
Progress Note from the Palliative Medicine Team at [facility]:  -patient is lethargic, wife at bedside  -continued conversation regarding natural trajectory and expectations at EOL, the commonality of infection at EOL,  and the natural process of dehydration at EOL  -we discussed the limited prognosis of likely days to weeks regardless of medical interventions     Objective: Allergies  Allergen Reactions  . Ivp Dye [Iodinated Diagnostic Agents] Swelling    13 hr prep/reaction was 20 years ago per patient   Scheduled Meds: . antiseptic oral rinse  7 mL Mouth Rinse q12n4p  . ceFEPime (MAXIPIME) IV  1 g Intravenous Q12H  . chlorhexidine  15 mL Mouth Rinse BID  . Chlorhexidine Gluconate Cloth  6 each Topical Q0600  . enoxaparin (LOVENOX) injection  40 mg Subcutaneous QHS  . lactose free nutrition  237 mL Oral TID BM  . mupirocin ointment  1 application Nasal BID  . pantoprazole (PROTONIX) IV  40 mg Intravenous Daily  . piperacillin-tazobactam (ZOSYN)  IV  3.375 g Intravenous 3 times per day  . sodium chloride  10-40 mL Intracatheter Q12H  . vancomycin  750 mg Intravenous Q12H   Continuous Infusions: . dextrose 5 % and 0.45% NaCl 70 mL/hr at 08/29/14 1143   PRN Meds:.acetaminophen, acetaminophen, docusate sodium, HYDROmorphone (DILAUDID) injection, ibuprofen, ipratropium-albuterol, lidocaine-prilocaine, LORazepam, morphine injection, ondansetron (ZOFRAN) IV, ondansetron, oxyCODONE, polyethylene glycol, prochlorperazine, sodium chloride  BP 145/58  Pulse 104  Temp(Src) 99.7 F (37.6 C) (Oral)  Resp 19  Ht 6\' 2"  (1.88 m)  Wt 80.7 kg (177 lb 14.6 oz)  BMI 22.83 kg/m2  SpO2 94%   PPS: 20 %    Intake/Output Summary (Last 24 hours) at 08/29/14 1402 Last data filed at 08/29/14 0540  Gross per 24 hour  Intake    643 ml  Output    900 ml  Net   -257 ml       Physical Exam:  General: Ill appearing, lethargic,  HEENT:  Dry  buccal membranes,  Chest: Decreased  in bases CTA, scattered rhonchi  CVS: tachycardic  Abdomen:soft NT +BS  Ext: without edema  Neuro: lethargic, left sided weakness, arouses to verbal stimuli   Labs: CBC    Component Value Date/Time   WBC 13.0* 08/29/2014 0543   WBC 11.1* 08/21/2014 1009   RBC 2.81* 08/29/2014 0543   RBC 2.92* 08/21/2014 1009   RBC 2.16* 07/25/2014 1626   HGB 9.0* 08/29/2014 0543   HGB 9.5* 08/21/2014 1009   HCT 28.2* 08/29/2014 0543   HCT 29.4* 08/21/2014 1009   PLT 299 08/29/2014 0543   PLT 347 08/21/2014 1009   MCV 100.4* 08/29/2014 0543   MCV 100.6* 08/21/2014 1009   MCH 32.0 08/29/2014 0543   MCH 32.6 08/21/2014 1009   MCHC 31.9 08/29/2014 0543   MCHC 32.4 08/21/2014 1009   RDW 15.1 08/29/2014 0543   RDW 15.6* 08/21/2014 1009   LYMPHSABS 2.7 08/26/2014 1105   LYMPHSABS 3.0 08/21/2014 1009   MONOABS 1.7* 08/26/2014 1105   MONOABS 1.6* 08/21/2014 1009   EOSABS 0.3 08/26/2014 1105   EOSABS 0.2 08/21/2014 1009   BASOSABS 0.0 08/26/2014 1105   BASOSABS 0.1 08/21/2014 1009    BMET    Component Value Date/Time   NA 139 08/29/2014 0543   NA 139 08/21/2014 1014   K 3.9 08/29/2014 0543   K 4.1 08/21/2014 1014   CL 100 08/29/2014 0543   CL 104 03/12/2013 1507   CO2 28  08/29/2014 0543   CO2 24 08/21/2014 1014   GLUCOSE 110* 08/29/2014 0543   GLUCOSE 123 08/21/2014 1014   GLUCOSE 135* 03/12/2013 1507   BUN 18 08/29/2014 0543   BUN 44.9* 08/21/2014 1014   CREATININE 0.69 08/29/2014 0543   CREATININE 0.7 08/21/2014 1014   CALCIUM 9.8 08/29/2014 0543   CALCIUM 9.5 08/21/2014 1014   GFRNONAA 89* 08/29/2014 0543   GFRAA >90 08/29/2014 0543    CMP     Component Value Date/Time   NA 139 08/29/2014 0543   NA 139 08/21/2014 1014   K 3.9 08/29/2014 0543   K 4.1 08/21/2014 1014   CL 100 08/29/2014 0543   CL 104 03/12/2013 1507   CO2 28 08/29/2014 0543   CO2 24 08/21/2014 1014   GLUCOSE 110* 08/29/2014 0543   GLUCOSE 123 08/21/2014 1014   GLUCOSE 135* 03/12/2013 1507   BUN 18 08/29/2014 0543   BUN 44.9* 08/21/2014 1014   CREATININE 0.69 08/29/2014 0543    CREATININE 0.7 08/21/2014 1014   CALCIUM 9.8 08/29/2014 0543   CALCIUM 9.5 08/21/2014 1014   PROT 6.1 08/26/2014 1105   PROT 6.2* 08/21/2014 1014   ALBUMIN 2.3* 08/26/2014 1105   ALBUMIN 2.3* 08/21/2014 1014   AST 29 08/26/2014 1105   AST 38* 08/21/2014 1014   ALT 8 08/26/2014 1105   ALT 11 08/21/2014 1014   ALKPHOS 172* 08/26/2014 1105   ALKPHOS 144 08/21/2014 1014   BILITOT 0.2* 08/26/2014 1105   BILITOT 0.31 08/21/2014 1014   GFRNONAA 89* 08/29/2014 0543   GFRAA >90 08/29/2014 0543     Assessment and Plan: 1. Code Status: DNR/DNI  2. Symptom Control:        Pain/Dyspnea: D/C IV Morphine for anticipation home in the next day or two                                   Roxanol 5 mg sl/po every 2 hrs prn                          May utilize IV Dilaudid order at bedtime         Anxiety:  Ativan 0.5  mg IV prn every 4 hrs prn  Psycho/Social:  Emotional support offered to wife at bedside.    I   educate wife to  understand the limited prognosis regardless of medical interventions.   I offered to meet with her children who live local, wife tells me as a family they do not "need that", they support me in my decisions    Jacqlyn Larsen understands the limited prognosis but in some way is hopeful for all her children to  "get here" from out of the [place] before the patient dies.  She tells me she in not willing to compromise on utilization of TPN (5 bags at home in the refrigerator) and completion of 10 days of antibiotics.    3. Spiritual;  Strong community church support  4. Disposition:  Wife verbalizes desire to take him home with IV antibiotics and TPN. She is hopeful for hospice services of [facility], will write for choice. 5.  I spoke with Dr Virgina Jock and although he strongly does not recommend  home TPN he is willing to accommodate this family in hopes of facilitating family acceptance and the bereavement of their loved one.     Patient Documents Completed or  Given: Document Given Completed  Advanced  Directives Pkt    MOST yes   DNR    Gone from My Sight    Hard Choices yes     Time In Time Out Total Time Spent with Patient Total Overall Time  1030 1130 60 min 60 min    Greater than 50%  of this time was spent counseling and coordinating care related to the above assessment and plan.  Wadie Lessen NP  Palliative Medicine Team Team Phone # [phone] Pager [phone]  Discussed with Dr Virgina Jock 1

## 2014-08-29 NOTE — Progress Notes (Signed)
ANTIBIOTIC CONSULT NOTE - Follow Up  Pharmacy Consult for Vancomycin Indication: Rule out PNA, vs Aspiration, obstructive/? Right hip infection  Allergies  Allergen Reactions  . Ivp Dye [Iodinated Diagnostic Agents] Swelling    13 hr prep/reaction was 20 years ago per patient   Patient Measurements: Height: 6\' 2"  (188 cm) Weight: 177 lb 14.6 oz (80.7 kg) IBW/kg (Calculated) : 82.2  Vital Signs: Temp: 99.7 F (37.6 C) (09/17 0544) Temp src: Oral (09/17 0544) BP: 145/58 mmHg (09/17 0544) Pulse Rate: 104 (09/17 0544) Intake/Output from previous day: 09/16 0701 - 09/17 0700 In: 853 [I.V.:853] Out: 1300 [Urine:1300] Intake/Output from this shift:    Labs:  Recent Labs  08/29/14 0543  WBC 13.0*  HGB 9.0*  PLT 299  CREATININE 0.69   Estimated Creatinine Clearance: 86.9 ml/min (by C-G formula based on Cr of 0.69).  Recent Labs  08/29/14 1015  [place] 14.5    Microbiology: Recent Results (from the past 720 hour(s))  TECHNOLOGIST REVIEW     Status: None   Collection Time    07/31/14 11:58 AM      Result Value Ref Range Status   Technologist Review     Final   Value: few Metas and Myelocytes present, large & giant plts present  CULTURE, BLOOD (ROUTINE X 2)     Status: None   Collection Time    08/26/14 10:54 AM      Result Value Ref Range Status   Specimen Description BLOOD LEFT ANTECUBITAL   Final   Special Requests BOTTLES DRAWN AEROBIC AND ANAEROBIC [facility] EACH   Final   Culture  Setup Time     Final   Value: 08/26/2014 14:31     Performed at [facility]   Culture     Final   Value: STAPHYLOCOCCUS SPECIES (COAGULASE NEGATIVE)     Note: SUSCEPTIBILITIES PERFORMED ON PREVIOUS CULTURE WITHIN THE LAST 5 DAYS.     Note: Gram Stain Report Called to,Read Back By and Verified With: JESSICA CONRAD @ 3 ON [id number] BY [facility]     Performed at [facility]   Report Status 08/29/2014 FINAL   Final  CULTURE, BLOOD (ROUTINE X 2)     Status: None   Collection Time    08/26/14 11:05 AM      Result Value Ref Range Status   Specimen Description BLOOD PORTA CATH   Final   Special Requests BOTTLES DRAWN AEROBIC AND ANAEROBIC 5ML   Final   Culture  Setup Time     Final   Value: 08/26/2014 14:31     Performed at [facility]   Culture     Final   Value: STAPHYLOCOCCUS SPECIES (COAGULASE NEGATIVE)     Note: RIFAMPIN AND GENTAMICIN SHOULD NOT BE USED AS SINGLE DRUGS FOR TREATMENT OF STAPH INFECTIONS.     Note: Gram Stain Report Called to,Read Back By and Verified With: HANS JOHNSON AT 1:48 A.M. ON 08/27/14 WARRB     Performed at [facility]   Report Status 08/29/2014 FINAL   Final   Organism ID, Bacteria STAPHYLOCOCCUS SPECIES (COAGULASE NEGATIVE)   Final  URINE CULTURE     Status: None   Collection Time    08/26/14 11:25 AM      Result Value Ref Range Status   Specimen Description URINE, CLEAN CATCH   Final   Special Requests NONE   Final   Culture  Setup Time     Final   Value: 08/26/2014  15:02     Performed at [place]     Final   Value: 35,000 COLONIES/ML     Performed at [facility]   Culture     Final   Value: ESCHERICHIA COLI     Performed at [facility]   Report Status 08/29/2014 FINAL   Final   Organism ID, Bacteria ESCHERICHIA COLI   Final  MRSA PCR SCREENING     Status: Abnormal   Collection Time    08/26/14  3:17 PM      Result Value Ref Range Status   MRSA by PCR POSITIVE (*) NEGATIVE Final   Comment:            The GeneXpert MRSA Assay (FDA     approved for NASAL specimens     only), is one component of a     comprehensive MRSA colonization     surveillance program. It is not     intended to diagnose MRSA     infection nor to guide or     monitor treatment for     MRSA infections.     RESULT CALLED TO, READ BACK BY AND VERIFIED WITH:     EARLY,S. @ 1902 ON 9.14.15 BY MCCOY,N.    Assessment: 71 YOM with esophageal cancer presents to ED with SOB,  cough, night sweats and fever. Vancomycin and zosyn ordered by physician for pneumonia (?aspiration) and ? R hip infection.  Vancomycin 1gm IV q24h ordered by physician, but steady state trough from June on 750mg  IV q12h resulted in trough = 57mcg/ml.  Received orders from oncall physician to dose and change to 750mg  IV q12h.   9/14 >> vancomycin  >> 9/14 >>zosyn  >> 9/17 9/17 >> Cefepime >>  Tmax: 100.4 WBCs: slightly elevated at 13K Renal: WNL  9/14 blood:2/2 Coag Neg Staph sens to Vancomycin 9/14 urine: E coli, resistant to Gent/Tobra  Drug level / dose changes info: 6/28 14:00 VT = 61mcg/ml on 750mg  q12h 9/17: 09:30 VT = 14.5 mcg/ml on 750mg  q12  9/17: D4 Vanc 750mg  q12h, D1 Cefepime 1gm q12  Goal of Therapy:  Vancomycin trough level 15-20 mcg/ml  Plan:   Continue vancomycin at 750mg  IV q12h  Changed Zosyn to Cefepime for ease of administration at home  Plan 10 days abx, completing course at home  Minda Ditto PharmD Pager [phone] 08/29/2014, 12:02 PM

## 2014-08-29 NOTE — Progress Notes (Signed)
Subjective: Admitted 9/14 c PNA - presumed Aspiration and terminal Esoph CA. Seen by Dr Berenice Primas, Dr Benay Spice and hospice/Palliative care team. Still not having fevers. No new c/o. She wants hospice to help transition to home. He is more uncomfortable, coughing, and ill this am.  Resting poorly - did better on Dilaudid > Morphine for rest.  Objective: Vital signs in last 24 hours: Temp:  [98.1 F (36.7 C)-100.4 F (38 C)] 99.7 F (37.6 C) (09/17 0544) Pulse Rate:  [104-125] 104 (09/17 0544) Resp:  [16-19] 19 (09/17 0544) BP: (130-156)/(52-86) 145/58 mmHg (09/17 0544) SpO2:  [93 %-94 %] 94 % (09/17 0544) Weight change:  Last BM Date: 08/26/14  CBG (last 3)  No results found for this basename: GLUCAP,  in the last 72 hours  Intake/Output from previous day:  Intake/Output Summary (Last 24 hours) at 08/29/14 0750 Last data filed at 08/29/14 0540  Gross per 24 hour  Intake    853 ml  Output   1300 ml  Net   -447 ml   09/16 0701 - 09/17 0700 In: 782 [I.V.:853] Out: 1300 [Urine:1300]   Physical Exam General appearance: Looks old and frail. R arm picking and irritated c arm bands Slurred speech. Very weak and frail  Head: Normocephalic, without obvious abnormality, atraumatic  Throat:OP dry and poor dentition  Neck: no adenopathy, no carotid bruit, no JVD and thyroid not enlarged, symmetric, no tenderness/mass/nodules  Resp: + Rhonchi. Using [employer]: Reg Tachy  Chest [facility] CDI  GI: soft, non-tender; bowel sounds normal; no masses, no organomegaly  R Hip is warm with blister at surgical site  Neurologic: Alert and oriented X 3,VERY WEAK Not using L arm much    Lab Results:  Recent Labs  08/26/14 1105 08/29/14 0543  NA 138 139  K 4.5 3.9  CL 98 100  CO2 28 28  GLUCOSE 144* 110*  BUN 38* 18  CREATININE 0.61 0.69  CALCIUM 9.7 9.8  MG 2.0  --   PHOS 2.5  --      Recent Labs  08/26/14 1105  AST 29  ALT 8  ALKPHOS 172*  BILITOT 0.2*  PROT 6.1   ALBUMIN 2.3*     Recent Labs  08/26/14 1105 08/29/14 0543  WBC 12.9* 13.0*  NEUTROABS 8.2*  --   HGB 8.9* 9.0*  HCT 27.2* 28.2*  MCV 99.3 100.4*  PLT 364 299    Lab Results  Component Value Date   INR 1.01 05/30/2014   INR 1.04 05/03/2014   INR 1.00 03/07/2014     Recent Labs  08/26/14 1105  TROPONINI <0.30    No results found for this basename: TSH, T4TOTAL, FREET3, T3FREE, THYROIDAB,  in the last 72 hours  No results found for this basename: VITAMINB12, FOLATE, FERRITIN, TIBC, IRON, RETICCTPCT,  in the last 72 hours  Micro Results: Recent Results (from the past 240 hour(s))  CULTURE, BLOOD (ROUTINE X 2)     Status: None   Collection Time    08/26/14 10:54 AM      Result Value Ref Range Status   Specimen Description BLOOD LEFT ANTECUBITAL   Final   Special Requests BOTTLES DRAWN AEROBIC AND ANAEROBIC [facility] EACH   Final   Culture  Setup Time     Final   Value: 08/26/2014 14:31     Performed at [facility]   Culture     Final   Value: STAPHYLOCOCCUS SPECIES (COAGULASE NEGATIVE)     Note:  Gram Stain Report Called to,Read Back By and Verified With: [place] @ 53 ON [id number] BY [facility]     Performed at [facility]   Report Status PENDING   Incomplete  CULTURE, BLOOD (ROUTINE X 2)     Status: None   Collection Time    08/26/14 11:05 AM      Result Value Ref Range Status   Specimen Description BLOOD PORTA CATH   Final   Special Requests BOTTLES DRAWN AEROBIC AND ANAEROBIC 5ML   Final   Culture  Setup Time     Final   Value: 08/26/2014 14:31     Performed at [facility]   Culture     Final   Value: STAPHYLOCOCCUS SPECIES (COAGULASE NEGATIVE)     Note: RIFAMPIN AND GENTAMICIN SHOULD NOT BE USED AS SINGLE DRUGS FOR TREATMENT OF STAPH INFECTIONS.     Note: Gram Stain Report Called to,Read Back By and Verified With: HANS JOHNSON AT 1:48 A.White. ON 08/27/14 WARRB     Performed at [facility]   Report Status PENDING   Incomplete   URINE CULTURE     Status: None   Collection Time    08/26/14 11:25 AM      Result Value Ref Range Status   Specimen Description URINE, CLEAN CATCH   Final   Special Requests NONE   Final   Culture  Setup Time     Final   Value: 08/26/2014 15:02     Performed at [place]     Final   Value: 35,000 COLONIES/ML     Performed at [facility]   Culture     Final   Value: ESCHERICHIA COLI     Performed at [facility]   Report Status PENDING   Incomplete  MRSA PCR SCREENING     Status: Abnormal   Collection Time    08/26/14  3:17 PM      Result Value Ref Range Status   MRSA by PCR POSITIVE (*) NEGATIVE Final   Comment:            The GeneXpert MRSA Assay (FDA     approved for NASAL specimens     only), is one component of a     comprehensive MRSA colonization     surveillance program. It is not     intended to diagnose MRSA     infection nor to guide or     monitor treatment for     MRSA infections.     RESULT CALLED TO, READ BACK BY AND VERIFIED WITH:     EARLY,S. @ 1902 ON 9.14.15 BY MCCOY,N.      Studies/Results: No results found.   Medications: Scheduled: . antiseptic oral rinse  7 mL Mouth Rinse q12n4p  . chlorhexidine  15 mL Mouth Rinse BID  . Chlorhexidine Gluconate Cloth  6 each Topical Q0600  . enoxaparin (LOVENOX) injection  40 mg Subcutaneous QHS  . lactose free nutrition  237 mL Oral TID BM  . mupirocin ointment  1 application Nasal BID  . pantoprazole  40 mg Oral Daily  . piperacillin-tazobactam (ZOSYN)  IV  3.375 g Intravenous 3 times per day  . sodium chloride  10-40 mL Intracatheter Q12H  . vancomycin  750 mg Intravenous Q12H   Continuous: . dextrose 5 % and 0.45% NaCl 70 mL/hr at 08/28/14 2108     Assessment/Plan: Active Problems:   Malignant neoplasm  of lower third of esophagus   Protein-calorie malnutrition, severe   Pneumonia   Aspiration pneumonia   Palliative care encounter   Pain,  generalized   Weakness generalized   DNR (do not resuscitate)   #1 - clinical PNA vrs Post Obstructive vrs Aspiration c fever/Leukocytosis/cough/Wheeze/hypoxia and infiltrate on CXR ---- GPC in clusters + Bacteremia - Coag (-) Staph - Even though thigh blister drainage was mostly bloody - ? Abscess - Continue  Zosyn/Vanco.  We discussed going home with Hospice and finishing Abx after total of 10 days and doing no more Abx fterwards.  Will change Zosyn to daily maxepime for ease of home use.  Hospice will need to arrange and help get home ready for D/c over the next 1-2 days.  Labs and CXR look the same as at admit.  He seems ready to just be made comfortable and allow die.  Wife and children are slowly progressing towards this.  However she is not ready to allow me to drop maintenance fluids.  She also says she has 5 more bags TNA at home and wants to use them when he gets there.  I will give him a shot of Dilaudid now to help him sleep and OK for HS prn to help rest.  Continue Ativan and MSO4 as ordered yesterday from hospice.  - Nebs and Pulm toilet - Yonker - DNR, No escalation of care, Allow to eat, use Yonker, treat pain and anxiety.   - Appreciate Hospice Palliative care consult -  -  Biologics and TNA on Hold.  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 OxyContin and continue oxycodone,Narcs and antianxiety meds also on order Hospice/Palliative Care consult and ultimate transition to Comfort measures only when ready.  #3 Malnutrition, protein-calorie - Will Hold/Stop TNA currently. Very Low Albumin   #4 chronic pain - On Narcs for Rx.   #5 Anxiety Xanax PO and prn IV Ativan ordered.  #6 DIABETES MELLITUS, TYPE II, CONTROLLED.  CBGs may be  high c D5 but does not matter  Hgb A1C: 05/30/14 A1C: 6.4% @ Hosp  Humalog SSI and Lantus had been held at home and we are holding here as DM is really not much of an issue for him at this time.   #7 - DVT prophylaxis - Lovenox per E Link order.  OK to stop.  #8 R Hip may be infected as well as hip is warm and tender and there is a warm Blister from the surgical site. Vanc ought to help. He is not strong enough for an operation. S/P Drainage of blood.  Follow clinically   Work on transition to home in next 1-2 days.    ID -  Anti-infectives   Start     Dose/Rate Route Frequency Ordered Stop   08/26/14 2200  piperacillin-tazobactam (ZOSYN) IVPB 3.375 g     3.375 g 12.5 mL/hr over 240 Minutes Intravenous 3 times per day 08/26/14 1815     08/26/14 2200  vancomycin (VANCOCIN) IVPB 750 mg/150 ml premix     750 mg 150 mL/hr over 60 Minutes Intravenous Every 12 hours 08/26/14 1911     08/26/14 1815  piperacillin-tazobactam (ZOSYN) IVPB  3.375 g  Status:  Discontinued     3.375 g 100 mL/hr over 30 Minutes Intravenous 4 times per day 08/26/14 1811 08/26/14 1813   08/26/14 1815  vancomycin (VANCOCIN) IVPB 1000 mg/200 mL premix  Status:  Discontinued     1,000 mg 200 mL/hr over 60 Minutes Intravenous Every 24 hours 08/26/14 1811 08/26/14 1911   08/26/14 1245  piperacillin-tazobactam (ZOSYN) IVPB 3.375 g  Status:  Discontinued     3.375 g 12.5 mL/hr over 240 Minutes Intravenous  Once 08/26/14 1238 08/26/14 1241   08/26/14 1245  vancomycin (VANCOCIN) IVPB 1000 mg/200 mL premix     1,000 mg 200 mL/hr over 60 Minutes Intravenous  Once 08/26/14 1238 08/26/14 1423   08/26/14 1245  piperacillin-tazobactam (ZOSYN) IVPB 3.375 g     3.375 g 100 mL/hr over 30 Minutes Intravenous  Once 08/26/14 1241 08/26/14 1453      LOS: 3 days   Matthew White 08/29/2014, 7:50 AM

## 2014-08-30 MED ORDER — DEXTROSE 5 % IV SOLN: 1.0000 g | Freq: Two times a day (BID) | INTRAVENOUS | Status: AC

## 2014-08-30 MED ORDER — ONDANSETRON HCL 4 MG PO TABS: 4.0000 mg | ORAL_TABLET | Freq: Four times a day (QID) | ORAL | Status: AC | PRN

## 2014-08-30 MED ORDER — MORPHINE SULFATE (CONCENTRATE) 10 MG /0.5 ML PO SOLN: 5.0000 mg | ORAL | Status: DC | PRN

## 2014-08-30 MED ORDER — CHLORHEXIDINE GLUCONATE 0.12 % MT SOLN: 15.0000 mL | Freq: Two times a day (BID) | OROMUCOSAL | Status: AC

## 2014-08-30 MED ORDER — LORAZEPAM 1 MG PO TABS: 1.0000 mg | ORAL_TABLET | Freq: Four times a day (QID) | ORAL | Status: DC | PRN

## 2014-08-30 MED ORDER — VITAMINS A & D EX OINT
TOPICAL_OINTMENT | CUTANEOUS | Status: AC
  Administered 2014-08-30: 5
  Filled 2014-08-30: qty 5

## 2014-08-30 MED ORDER — ACETAMINOPHEN 325 MG PO TABS: 650.0000 mg | ORAL_TABLET | Freq: Four times a day (QID) | ORAL | Status: AC | PRN

## 2014-08-30 MED ORDER — VANCOMYCIN HCL IN DEXTROSE 750-5 MG/150ML-% IV SOLN: 750.0000 mg | Freq: Two times a day (BID) | INTRAVENOUS | Status: DC

## 2014-08-30 MED ORDER — HYDROMORPHONE HCL 1 MG/ML IJ SOLN: 0.5000 mg | Freq: Two times a day (BID) | INTRAMUSCULAR | Status: DC | PRN

## 2014-08-30 MED ORDER — SCOPOLAMINE 1 MG/3DAYS TD PT72
1.0000 | MEDICATED_PATCH | TRANSDERMAL | Status: DC
  Administered 2014-08-30: 1.5 mg via TRANSDERMAL
  Filled 2014-08-30: qty 1

## 2014-08-30 NOTE — Care Management Note (Signed)
CARE MANAGEMENT NOTE 08/30/2014  Patient:  Matthew White, Matthew White   Account Number:  [id number]  Date Initiated:  08/30/2014  Documentation initiated by:  Leafy Kindle  Subjective/Objective Assessment:   Pt admitted with PNA, Tachypnea, Tachycardia.  Hx of metastatic esophageal CA.     Action/Plan:   From home with wife   Anticipated DC Date:  08/31/2014   Anticipated DC Plan:  [place]  CM consult      Choice offered to / List presented to:  [name] Spouse           Status of service:  In process, will continue to follow Medicare Important Message given?  YES (If response is "NO", the following Medicare IM given date fields will be blank) Date Medicare IM given:  08/30/2014 Medicare IM given by:  Leafy Kindle Date Additional Medicare IM given:   Additional Medicare IM given by:    Discharge Disposition:    Per UR Regulation:  Reviewed for med. necessity/level of care/duration of stay  If discussed at [place] of Stay Meetings, dates discussed:    Comments:  08/30/14 [name],[name],[name] [phone] Met with pt and wife to offer choice for home hospice per MD order.  Pts wife Matthew White is a Therapist, sports and very knowledgeble about pts care needs.  They would like to use [facility] for in home hospice services.  Matthew White states that she is not sure if [facility] will accept pt due to her wanting to continue the pt on home TPN and IV abx until her children are able to arrive [place].  Matthew White states that if [facility] does not accept them then she would like to use [name] as her [facility] choice.  [name] from [facility] called and informed of pt situation.  CM will continue to follow for DC needs.

## 2014-08-30 NOTE — Discharge Summary (Signed)
Physician Discharge Summary  DISCHARGE SUMMARY   Patient ID: Matthew White MR#: [id number] DOB/AGE: 78/26/1935 78 y.o.   Attending Physician:RUSSO,JOHN M  Patient's PCP:RUSSO,JOHN M, MD  Consults:Treatment Team:  Gary B Sherrill, MD [facility]iative [facility]**  Admit date: 08/26/2014 Discharge date: 08/30/2014  Discharge Diagnoses:  Active Problems:   Malignant neoplasm of lower third of esophagus   Protein-calorie malnutrition, severe   Pneumonia   Aspiration pneumonia   Palliative care encounter   Pain, generalized   Weakness generalized   DNR (do not resuscitate)   Patient Active Problem List   Diagnosis Date Noted  . Palliative care encounter 08/28/2014  . Pain, generalized 08/28/2014  . Weakness generalized 08/28/2014  . DNR (do not resuscitate) 08/28/2014  . Pneumonia 08/26/2014  . Aspiration pneumonia 08/26/2014  . Gait instability 06/03/2014    Class: Acute  . Hypotension, unspecified 06/03/2014    Class: Chronic  . Facial abscess 05/30/2014  . Protein-calorie malnutrition, severe 05/30/2014  . Secondary malignant neoplasm of bone and bone marrow 05/19/2014  . 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 Malignant neoplasm of lower third of esophagus 01/12/2013   Past Medical History  Diagnosis Date  . Gout     no flare up in ten years   . Barrett esophagus   . Esophageal cancer 12/22/2012    invasive adenocarcinoma arising in a background of barrett's mucosa  . Diabetes mellitus without complication     type II; last A1C 6.5 10/2012  . GERD (gastroesophageal reflux disease)     managed by prilosec  . Hx of pleural effusion 10/2013    left. pt states it was drained at [facility].     Discharged Condition:  Terminal   Discharge Medications:   Medication List    STOP taking these medications       ENSURE     HUMULIN R 100 units/mL injection  Generic drug:  insulin regular     insulin glargine 100 UNIT/ML injection   Commonly known as:  LANTUS     insulin lispro 100 UNIT/ML injection  Commonly known as:  HUMALOG     lactose free nutrition Liqd     loperamide 2 MG tablet  Commonly known as:  IMODIUM A-D     omeprazole 20 MG capsule  Commonly known as:  PRILOSEC     oxyCODONE-acetaminophen 5-325 MG per tablet  Commonly known as:  PERCOCET/ROXICET     PRESCRIPTION MEDICATION     sodium phosphate enema  Commonly known as:  FLEET      TAKE these medications       acetaminophen 325 MG tablet  Commonly known as:  TYLENOL  Take 2 tablets (650 mg total) by mouth every 6 (six) hours as needed for mild pain (or Fever >/= 101).     ADULT TPN  Inject into the vein continuous. [facility] - goes for about 14 hours     ALPRAZolam 1 MG tablet  Commonly known as:  XANAX  Take 0.5-1 mg by mouth at bedtime as needed for sleep.     ceFEPIme 1 g in dextrose 5 % 50 mL  Inject 1 g into the vein every 12 (twelve) hours.     chlorhexidine 0.12 % solution  Commonly known as:  PERIDEX  15 mLs by Mouth Rinse route 2 (two) times daily.     docusate sodium 100 MG capsule  Commonly known as:  COLACE  Take 100 mg by mouth 2 (  two) times daily as needed for mild constipation. 100mg in the morning and 200mg at bedtime     ibuprofen 200 MG tablet  Commonly known as:  ADVIL,MOTRIN  Take 400 mg by mouth 2 (two) times daily as needed for mild pain.     lidocaine-prilocaine cream  Commonly known as:  EMLA  Apply 1 application topically as needed (for port).     morphine CONCENTRATE 10 mg / 0.5 ml concentrated solution  Take 0.25 mLs (5 mg total) by mouth every 2 (two) hours as needed for moderate pain or shortness of breath.     ondansetron 4 MG tablet  Commonly known as:  ZOFRAN  Take 1 tablet (4 mg total) by mouth every 6 (six) hours as needed for nausea.     OxyCODONE 20 mg T12a 12 hr tablet  Commonly known as:  OXYCONTIN  Take 1 tablet (20 mg total) by mouth every 12 (twelve) hours.      oxyCODONE 5 MG immediate release tablet  Commonly known as:  Oxy IR/ROXICODONE  Take 1-3 tablets by mouth every 4 hours as needed     polyethylene glycol packet  Commonly known as:  MIRALAX / GLYCOLAX  Take 17 g by mouth daily as needed for mild constipation.     prochlorperazine 5 MG tablet  Commonly known as:  COMPAZINE  Take 1 tablet (5 mg total) by mouth every 6 (six) hours as needed for nausea or vomiting.     Vancomycin 750 MG/150ML Soln  Commonly known as:  VANCOCIN  Inject 150 mLs (750 mg total) into the vein every 12 (twelve) hours.        Hospital Procedures: Ct Chest Wo Contrast  08/20/2014   CLINICAL DATA:  Esophageal cancer 2014.  EXAM: CT CHEST WITHOUT CONTRAST  TECHNIQUE: Multidetector CT imaging of the chest was performed following the standard protocol without IV contrast.  COMPARISON:  Chest CT 05/17/2014  FINDINGS: Changes of esophagectomy with gastric pull-through. No mediastinal or axillary adenopathy. Increasing rounded soft tissue in the right hilum, likely worsening right hilar adenopathy, difficult to measure without intravenous contrast but approximately 2.6 cm on image 39.  Enlarging right lower lobe subpleural mass measuring 3.5 cm on today's study compared with 1.9 cm previously.  Left lingular subpleural nodule measures 2.6 cm, stable. Small right pleural effusion, new since prior study. Pleural thickening noted at the left lung base, stable, likely postoperative.  Enlarging destructive lytic lesion within the lateral right second rib with increasing abnormal soft tissue. Enlarging destructive lesion within the posterior fourth rib, now with associated soft tissue mass measuring approximately 3.5 cm. There is new destructive lytic lesion with soft tissue mass measuring 16 mm associated with the anterior right third rib.  Large soft tissue mass arising from the posterior left tenth rib. The soft tissue mass measures 4.4 cm. The lytic lesion within the posterior tenth  rib previously measured 1.9 cm.  Increasing sclerosis within the left clavicle head lesion, likely related to treated metastasis. Decreasing soft tissue mass associated with the T2 vertebral body. Enlarging right T8 vertebral body lytic lesion currently measuring 17 mm on image 33, very small prior study. Enlarging left T10 vertebral body lesion measuring 20 mm on today's study compared to 13 mm previously.  Heart is normal size. Dense coronary artery calcifications. Right Port-A-Cath remains in place, unchanged.  Imaging into the upper abdomen shows no acute findings. Cystic area in the upper pole of the left kidney is stable.  IMPRESSION:   While there is improved appearance of the left clavicular head lytic lesion and lytic lesion with associated soft tissue associated with the T2 vertebral body, multiple other bony metastases (bilateral ribs) have enlarged and now demonstrate associated soft tissue masses. Enlarging right T8 vertebral body lytic lesion. Enlarging left T10 vertebral body lytic lesion.  Enlarging subpleural right lower lobe mass. Stable lingular subpleural lesion.  Enlarging right hilar adenopathy.  Trace right pleural effusion.  Stable left pleural thickening.   Electronically Signed   By: Rolm Baptise M.D.   On: 08/20/2014 15:31   Dg Chest Port 1 View  08/29/2014   CLINICAL DATA:  Follow-up of pneumonia  EXAM: PORTABLE CHEST - 1 VIEW  COMPARISON:  Portable chest x-ray of August 26, 2014  FINDINGS: There has been mild interval deterioration in the appearance of the pulmonary interstitium especially on the right. Areas of confluent densitypersistent in the mid lung. On the left the interstitial markings are mildly increased and stable. A small amount of pleural fluid blunts the lateral costophrenic angles. The cardiac silhouette is normal in size. The power port appliance tip overlies the distal aspect of the SVC.  IMPRESSION: There has been slight interval worsening of the appearance of the  pulmonary interstitium especially on the right consistent with worsening pneumonia.   Electronically Signed   By: David  Martinique   On: 08/29/2014 07:52   Dg Chest Port 1 View  (if Code Sepsis Called)  08/26/2014   CLINICAL DATA:  Fever, review of electronic medical record indicates a history of esophageal cancer  EXAM: PORTABLE CHEST - 1 VIEW  COMPARISON:  08/20/2014 CT, 07/24/2014 chest radiograph  FINDINGS: Right-sided Port-A-Cath in place with tip at the cavoatrial junction. Heart size upper limits of normal. Increased left pleural effusion is identified, currently small. Trace right pleural effusion is new since the prior study. There are new patchy ill-defined airspace opacities bilaterally with central vascular congestion. Underlying pulmonary nodules are reidentified but poorly visualized as compared to the prior exam.  IMPRESSION: New patchy bilateral airspace opacities in the setting of increased pleural effusions and mild cardiomegaly, most likely edema. However, progression of metastatic disease or other alveolar filling processes could appear similar.   Electronically Signed   By: Conchita Paris M.D.   On: 08/26/2014 11:36    History of Present Illness:  76 male pt c metastatic Esoph CA on Chemo and HS TNA. He presnts to ED on 08/26/14 c @ 5 days of Night sweats and Fever spike to 102 on the night prior to admit. He had some cough and shortness of breath. He has also had worsening weakness. No reported history of dysuria or hematuria. No vomiting or diarrhea noted. + coughing/wheezing. They called Dr Benay Spice  One night prior and he was sent to ED by EMS. Pt does have a port-a-cath and it was already accessed on arrival. EKG showed sinus Tachy. WBC 12.9. BNP elevated at 612. CXR c/w New patchy bilateral airspace opacities in the setting of increased pleural effusions and mild cardiomegaly, most likely edema. However, progression of metastatic disease or other alveolar filling processes could  appear similar. He was Dxed with PNA and given Vanco and Zosyn. I was called for inpt admit. He was admitted to step down d/t how ill he was. On my arrival to admit he seemed to know he was dying. Wife was not ready to let go. Long Discussion @ end of life, QOL and other issues.     Hospital Course:  Admitted 9/14 c PNA - presumed Aspiration and terminal Esoph CA. He was treated c D5NS, IV Abx, Pain meds and Antianxiety meds. He seemed to do well with the Ativan/Dilaudid combination. Lots of conversations with Wife and pt about End of life and that there is nothing further to offer medically. Seen by Dr Berenice Primas, Dr Benay Spice and hospice/Palliative care team. Dr Berenice Primas aspirated a Bloody warm "infectious vrs cancerous" cyst on R Hip. He continued to spike fevers. Labs and CXR on 9/17 showed no improvement. As we backed meds to oral so he could be D/ced he got more agitated. Wife insisted on completing 10 days Abx regimen.  I saw it as futile but not harmful.  The original Vanc/Zosyn was changed to Vanco/Maxipime for easier home administration. He Nasal swabbed + for MRSA.  He Grew Coag Neg Staph in 2/2 Blood Cultured. When he was awake as an inpt he coughed a lot and used a Yonker to clear secretions. When asleep he was comfortable. We used O2 for comfort.  #1 - clinical PNA vrs Post Obstructive vrs Aspiration c fever/Leukocytosis/cough/Wheeze/hypoxia and infiltrate on CXR  ---- GPC in clusters + Bacteremia - Coag (-) Staph - Vanco  - Even though thigh blister drainage was mostly bloody - ? Abscess  - Continue Maxipime/Vanco. Pt will go home with Hospice and finish Abx after total of 10 days and doing no more Abx afterwards. Hospice will need to arrange and help get home ready for D/c over the today/tomorrow. Labs and CXR look the same as at admit. He seems ready to just be made comfortable and allow die. Wife and children are slowly progressing towards this. However she was not ready to allow  me to drop maintenance fluids but We will not do it as outpatient. She also says she has 5 more bags TNA at home and wants to use them when he gets there. He has needed IV dilauded to help rest better.  Hospice has offered admit to [facility] and wife prefers to take him home.  Continue Ativan (In hospital)/Xanax/Oxycontin/Oxycodone/MSO4/Dilaudid (In hospital) for comfort.  OOF DNR signed- Nebs and Pulm toilet - Yonker were done in house for comfort  - Roxanol 5 mg sl/po every 2 hrs prn for home use - DNR, No escalation of care, Allow to eat, use Yonker, treat pain, Nausea, and anxiety.  - Appreciate Hospice Palliative care consult -  - Biologics will not be offered and TNA on Hold in house and will cancel order after next 5 outpt doses given.  - This is his terminal event. 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  Hospice/Palliative Care consult and ultimate transition to Comfort measures.   #3 Malnutrition, protein-calorie - Very Low Albumin  #4 chronic pain - On Narcs for Rx.  #5 Anxiety Xanax PO and prn IV Ativan ordered.  #6 DIABETES MELLITUS, TYPE II, CONTROLLED. CBGs may be high c D5 but does not matter  Hgb A1C: 05/30/14 A1C: 6.4% @ Hosp  Humalog SSI and Lantus held.  DM is really not much of an issue for him at this time.  #7 - DVT prophylaxis - Lovenox per E Link order. OK to stop.  #8 R Hip may be infected as well as  hip is warm and tender and there is a warm Blister from the surgical site. Vanc ought to help. He is not strong enough for an operation. S/P Drainage of blood. Follow clinically   Working on transition to home.   On arrival for 9/19 am rounds he was restless and agitated.  Nursing worked with him and he eventually fell into a deep sleep.  I did the best I could on med  list and will await Nursing, Jacqlyn Larsen (wife) and hospice to inform me when everything will be ready for safe D/c.       Day of Discharge Exam BP 144/64  Pulse 119  Temp(Src) 98.7 F (37.1 C) (Oral)  Resp 20  Ht 6' 2" (1.88 m)  Wt 80.7 kg (177 lb 14.6 oz)  BMI 22.83 kg/m2  SpO2 93%  Physical Exam: General appearance: Sick, dying.  Now asleep Resp: Rhonchi and cough Cardio: Reg, Tachy GI: soft, non-tender;  Extremities: R hip Dressed.  Discharge Labs:  Recent Labs  08/29/14 0543  NA 139  K 3.9  CL 100  CO2 28  GLUCOSE 110*  BUN 18  CREATININE 0.69  CALCIUM 9.8   No results found for this basename: AST, ALT, ALKPHOS, BILITOT, PROT, ALBUMIN,  in the last 72 hours  Recent Labs  08/29/14 0543  WBC 13.0*  HGB 9.0*  HCT 28.2*  MCV 100.4*  PLT 299   No results found for this basename: CKTOTAL, CKMB, CKMBINDEX, TROPONINI,  in the last 72 hours No results found for this basename: TSH, T4TOTAL, FREET3, T3FREE, THYROIDAB,  in the last 72 hours No results found for this basename: VITAMINB12, FOLATE, FERRITIN, TIBC, IRON, RETICCTPCT,  in the last 72 hours Lab Results  Component Value Date   INR 1.01 05/30/2014   INR 1.04 05/03/2014   INR 1.00 03/07/2014       Discharge instructions:  01-Home or Self Care    Disposition: Home c Hospice  Follow-up Appts: None  Condition on Discharge: Terminal  Tests Needing Follow-up: nonw  Time spent in discharge (includes decision making & examination of pt): 40 min  Signed: Connelly Spruell M 08/30/2014, 6:39 AM

## 2014-08-30 NOTE — Progress Notes (Signed)
Chaplain provided support at bedside with pt (Matthew White) and spouse Jacqlyn Larsen). Emotional and spiritual support around end of life, transition to home care.

## 2014-08-30 NOTE — Discharge Instructions (Addendum)
Just finish up the 5 more TNA bags that are currently at his house. We will not be continuing it afterwards. Finish a full 10 days of Abx and no more.   About Your Loved One's Last Days The death of a loved one is stressful for family and friends. The stress can be lessened if you understand some of the changes your loved one may go through when death is near. This information will tell you about these changes and what you can do to help.  SPIRITUAL NEEDS Meeting spiritual needs is important to some families. A priest, minister, or spiritual leader can be included as a member of the caregiving team to provide guidance and counseling along the way. CARE Talk with your loved one about wishes for the last phase of life. The following are types of care one can have:   Home care. Many people choose to die in their own home or the home of a family member. This might require you to take on the role of caregiver.  Hospice care. Hospice care can take place in a variety of settings. Most hospice care occurs at home. Hospice services might also help in a 24-hour residential care setting. Hospice personnel may also offer services at a nursing home where they add to the nursing home's care.  Hospital. Some people prefer the comfort of having nurses and doctors nearby at all times.  Nursing home. Nursing homes have medical staff on duty at all times. Nursing home care can be combined with hospice care. CHANGES YOU MAY SEE Sleeping more  Keep a light on in the room. They may not be able to see as well when the wake up.  Give any needed medicine when they wake up. Confusion   Your loved one may talk to people who are not in the room or people who have already died.  Comfort your loved one if they seem scared. Do not try to correct them.  You may see restlessness and unusual behavior, such as pulling on the sheets. Less thirst and hunger  Loss of interest in food is normal when nearing death.  Do  not  make your loved one drink or eat.  Keep their mouth moist for comfort. Offer chips of ice if they are still able to chew. Put petroleum jelly on their lips to keep them from being too dry.  Do not force any medicine. Not peeing (urinating) or pooping (having bowel movements) as much  Put bed pads under them, and change them when needed to keep them clean and dry.  If your loved one expresses a need to pee and cannot, call a doctor. Fluid in the mouth or throat   Fluids may become thick in the mouth because of less eating and drinking. Sometimes, thick spit (mucus) in the throat will build up. It may help to raise the person's head and shoulders on pillows.  Gently wipe out their mouth with the wet mouth sponges. These are available from your doctor. Cold arms, legs, and body  Arms, legs and feet may feel cold and may look blue in color. Lips may also look blue.  Breathing changes  Breaths may not be as deep, or breaths may become deeper, like snoring. This is normal.  There may be as much as 30 seconds between breaths. This change may last awhile and it may come and go. AT THE TIME OF DEATH:  Breathing stops.  The heart stops beating.  Your loved one cannot  be awakened.  His/her eyes may be part way open.  The mouth may drop open a little. Do not be afraid. These are all natural occurrences. Feelings of grief or relief at this time are normal. Talk to your caregiving team about any questions or needs you have. Document Released: 11/17/2009 Document Revised: 02/21/2012 Document Reviewed: 11/17/2009 [facility] Patient Information 2015 [place], [place]. This information is not intended to replace advice given to you by your health care provider. Make sure you discuss any questions you have with your health care provider.

## 2014-08-30 NOTE — Progress Notes (Signed)
Continued support with pt and spouse.  Shared therapeutic music with pt while calming for sleep.   [place], [place]

## 2014-08-30 NOTE — Progress Notes (Addendum)
Notified by [facility] Alinda Sierras regarding family choice for [facility]) after discharge. Pt information reviewed with Dr. Alferd Patee, [place] Director, pt eligible with dx: Esophageal Ca. Spoke with pt's wife Jacqlyn Larsen via phone [phone]) initiated education regarding hospice services, philosophy and team approach to care., with good understanding voiced. Jacqlyn Larsen stated that her plan is to continue the abt and TNA just for a few more days until she can get all the children home to see their father, she did voice her understanding that he is terminal. Dr. Konrad Dolores, [place] Director is aware. DME requested : Electric hospital bed with AP+P mattress, 1/2 rails for foot and head, over bed table. Home oxygen: concentrator, nasal cannula with humidifier bottle, suction machine and E tanks. Equipment to be delivered after 5pm today 9/18 per [facility] request. Pt's PCP: Dr. Shon Baton Pharmacy: [facility] on [address] Pt to d/c with IV abt: Vancomycin and Cefpime, prescriptions faxed to [facility] for delivery to the home. Writer confirmed with [facility] pharmacist Miranda. Pt has TNA at home, this will expire on 9/19 per [facility] pharmacist. Pt's wife Jacqlyn Larsen made aware, [facility] to send 5 bags of TPN per their protocol, when antibiotics are delivered. All tubing and supplies to be delivered to patient's home as well. All prescriptions are in place in pt wallaroo. Morphine concentrate solution 10mg /71ml, dose 5mg  Q 2hrs PRN, dispense 30 mls, corrected by Dr. Virgina Jock, to be signed prior to discharge. Pt to D/C 9/19 via Non Emergent transport. GOLD DNR form in place in [place]. Please call [facility] @ [phone] when pt is leaving. All above information shared with [facility] Alinda Sierras via phone conversation. Thank you. Flo Shanks 08/30/2014 2:10pm [facility] Liaison [phone]

## 2014-08-30 NOTE — Progress Notes (Signed)
CSW received consult regarding home TNA.  RNCM made aware and has already met with pt and pt wife this morning.  Inappropriate CSW referral.  CSW signing off.   Alison Murray, MSW, [place] [employer] [phone]

## 2014-08-31 MED ORDER — MORPHINE SULFATE (CONCENTRATE) 10 MG /0.5 ML PO SOLN: 5.0000 mg | ORAL | Status: AC | PRN

## 2014-08-31 MED ORDER — VANCOMYCIN HCL IN DEXTROSE 750-5 MG/150ML-% IV SOLN: 750.0000 mg | Freq: Two times a day (BID) | INTRAVENOUS | Status: AC

## 2014-08-31 MED ORDER — ALPRAZOLAM 1 MG PO TABS: 0.5000 mg | ORAL_TABLET | Freq: Three times a day (TID) | ORAL | Status: AC | PRN

## 2014-08-31 MED ORDER — VANCOMYCIN HCL IN DEXTROSE 750-5 MG/150ML-% IV SOLN
750.0000 mg | Freq: Two times a day (BID) | INTRAVENOUS | Status: DC
  Administered 2014-08-31: 750 mg via INTRAVENOUS
  Filled 2014-08-31: qty 150

## 2014-08-31 MED ORDER — LORAZEPAM 2 MG/ML PO CONC: 1.0000 mg | ORAL | Status: AC | PRN

## 2014-08-31 MED ORDER — LORAZEPAM 2 MG/ML PO CONC: 1.0000 mg | ORAL | Status: DC | PRN

## 2014-08-31 MED ORDER — SCOPOLAMINE 1 MG/3DAYS TD PT72: 1.0000 | MEDICATED_PATCH | TRANSDERMAL | Status: AC

## 2014-08-31 NOTE — Progress Notes (Signed)
Pt. Has been discharged home with hospice.  Pt. Wife was given discharge instructions, prescriptions, and all questions were answered.  Non-emergency ambulance transport was arranged for pt.

## 2014-08-31 NOTE — Progress Notes (Signed)
ANTIBIOTIC CONSULT NOTE - Follow Up  Pharmacy Consult for Vancomycin Indication: Rule out PNA, vs Aspiration, obstructive/? Right hip infection  Allergies  Allergen Reactions  . Ivp Dye [Iodinated Diagnostic Agents] Swelling    13 hr prep/reaction was 20 years ago per patient   Patient Measurements: Height: 6\' 2"  (188 cm) Weight: 177 lb 14.6 oz (80.7 kg) IBW/kg (Calculated) : 82.2  Vital Signs: Temp: 100.9 F (38.3 C) (09/18 2304) Temp src: Axillary (09/18 2304) Intake/Output from previous day: 09/18 0701 - 09/19 0700 In: 981.7 [I.V.:781.7; IV Piggyback:200] Out: 397 [Urine:875] Intake/Output from this shift:    Labs:  Recent Labs  08/29/14 0543  WBC 13.0*  HGB 9.0*  PLT 299  CREATININE 0.69   Estimated Creatinine Clearance: 86.9 ml/min (by C-G formula based on Cr of 0.69).  Recent Labs  08/29/14 1015  [place] 14.5    Microbiology: Recent Results (from the past 720 hour(s))  CULTURE, BLOOD (ROUTINE X 2)     Status: None   Collection Time    08/26/14 10:54 AM      Result Value Ref Range Status   Specimen Description BLOOD LEFT ANTECUBITAL   Final   Special Requests BOTTLES DRAWN AEROBIC AND ANAEROBIC [facility] EACH   Final   Culture  Setup Time     Final   Value: 08/26/2014 14:31     Performed at [facility]   Culture     Final   Value: STAPHYLOCOCCUS SPECIES (COAGULASE NEGATIVE)     Note: SUSCEPTIBILITIES PERFORMED ON PREVIOUS CULTURE WITHIN THE LAST 5 DAYS.     Note: Gram Stain Report Called to,Read Back By and Verified With: JESSICA CONRAD @ 42 ON [id number] BY [facility]     Performed at [facility]   Report Status 08/29/2014 FINAL   Final  CULTURE, BLOOD (ROUTINE X 2)     Status: None   Collection Time    08/26/14 11:05 AM      Result Value Ref Range Status   Specimen Description BLOOD PORTA CATH   Final   Special Requests BOTTLES DRAWN AEROBIC AND ANAEROBIC 5ML   Final   Culture  Setup Time     Final   Value: 08/26/2014 14:31   Performed at [facility]   Culture     Final   Value: STAPHYLOCOCCUS SPECIES (COAGULASE NEGATIVE)     Note: RIFAMPIN AND GENTAMICIN SHOULD NOT BE USED AS SINGLE DRUGS FOR TREATMENT OF STAPH INFECTIONS.     Note: Gram Stain Report Called to,Read Back By and Verified With: HANS JOHNSON AT 1:48 A.M. ON 08/27/14 WARRB     Performed at [facility]   Report Status 08/29/2014 FINAL   Final   Organism ID, Bacteria STAPHYLOCOCCUS SPECIES (COAGULASE NEGATIVE)   Final  URINE CULTURE     Status: None   Collection Time    08/26/14 11:25 AM      Result Value Ref Range Status   Specimen Description URINE, CLEAN CATCH   Final   Special Requests NONE   Final   Culture  Setup Time     Final   Value: 08/26/2014 15:02     Performed at [place]     Final   Value: 35,000 COLONIES/ML     Performed at [facility]   Culture     Final   Value: ESCHERICHIA COLI     Performed at [facility]   Report Status 08/29/2014 FINAL  Final   Organism ID, Bacteria ESCHERICHIA COLI   Final  MRSA PCR SCREENING     Status: Abnormal   Collection Time    08/26/14  3:17 PM      Result Value Ref Range Status   MRSA by PCR POSITIVE (*) NEGATIVE Final   Comment:            The GeneXpert MRSA Assay (FDA     approved for NASAL specimens     only), is one component of a     comprehensive MRSA colonization     surveillance program. It is not     intended to diagnose MRSA     infection nor to guide or     monitor treatment for     MRSA infections.     RESULT CALLED TO, READ BACK BY AND VERIFIED WITH:     EARLY,S. @ 1902 ON 9.14.15 BY MCCOY,N.    Assessment: 21 YOM with esophageal cancer presents to ED with SOB, cough, night sweats and fever. Vancomycin and zosyn ordered by physician for pneumonia (?aspiration) and ? R hip infection.  Vancomycin 1gm IV q24h ordered by physician, but steady state trough from June on 750mg  IV K59X resulted in trough = 56mcg/ml.   Received orders from oncall physician to dose and change to 750mg  IV q12h.   9/14 >> vancomycin  >> 9/14 >>zosyn  >> 9/17 9/17 >> Cefepime >>  Tmax: 100.9 WBCs: slightly elevated at 13K Renal: WNL  9/14 blood:2/2 Coag Neg Staph sens to Vancomycin 9/14 urine: E coli, resistant to Gent/Tobra  Drug level / dose changes info: 6/28 14:00 VT = 20mcg/ml on 750mg  q12h 9/17: 09:30 VT = 14.5 mcg/ml on 750mg  q12  9/19: Day 6 of 10 abx's with D6 Vanc 750mg  q12h, D3 Cefepime 1gm q12  Goal of Therapy:  Vancomycin trough level 15-20 mcg/ml  Plan:   Continue vancomycin at 750mg  IV q12h to complete 10 days of antibiotics on 9/23  Continue Cefepime 1g q12 to complete 10 days of antibiotics on 9/23  Plan 10 days abx, completing course at home  No need to check another vancomycin trough  Adrian Saran, PharmD, BCPS Pager [phone] 08/31/2014 10:07 AM

## 2014-08-31 NOTE — Discharge Summary (Signed)
Addendum to DISCHARGE SUMMARY:  AH BOTT MR#: [id number]  DOB:09/02/1934  Date of Admission: 08/26/2014 Date of Discharge: 08/31/2014  Patient remained in hospital overnight to allow for final arrangements at home regarding IV antibiotics and hospital bed.  Hospice has been ordered and met with family.  Son, at bedside, is comfortable with plans as outlined by Dr. Virgina Jock in discharge summary from yesterday.  He requested Xanax and Ativan to use as needed as his wife feels that Xanax is more effective but I do not think he will be able to swallow pills effectively.  Liquid Ativan provided if needed.  New Rxs reviewed and signed.  Final discharge medication list will be:  Discharge Medications:   Medication List    STOP taking these medications       ENSURE     HUMULIN R 100 units/mL injection  Generic drug:  insulin regular     insulin glargine 100 UNIT/ML injection  Commonly known as:  LANTUS     insulin lispro 100 UNIT/ML injection  Commonly known as:  HUMALOG     lactose free nutrition Liqd     loperamide 2 MG tablet  Commonly known as:  IMODIUM A-D     omeprazole 20 MG capsule  Commonly known as:  PRILOSEC     oxyCODONE-acetaminophen 5-325 MG per tablet  Commonly known as:  PERCOCET/ROXICET     PRESCRIPTION MEDICATION     sodium phosphate enema  Commonly known as:  FLEET      TAKE these medications       acetaminophen 325 MG tablet  Commonly known as:  TYLENOL  Take 2 tablets (650 mg total) by mouth every 6 (six) hours as needed for mild pain (or Fever >/= 101).     ADULT TPN  Inject into the vein continuous. [facility] - goes for about 14 hours     ALPRAZolam 1 MG tablet  Commonly known as:  XANAX  Take 0.5-1 tablets (0.5-1 mg total) by mouth 3 (three) times daily as needed for anxiety or sleep.     ceFEPIme 1 g in dextrose 5 % 50 mL  Inject 1 g into the vein every 12 (twelve) hours- to complete on 09/04/14     chlorhexidine 0.12 % solution   Commonly known as:  PERIDEX  15 mLs by Mouth Rinse route 2 (two) times daily.     docusate sodium 100 MG capsule  Commonly known as:  COLACE  Take 100 mg by mouth 2 (two) times daily as needed for mild constipation. 161m in the morning and 2073mat bedtime     ibuprofen 200 MG tablet  Commonly known as:  ADVIL,MOTRIN  Take 400 mg by mouth 2 (two) times daily as needed for mild pain.     lidocaine-prilocaine cream  Commonly known as:  EMLA  Apply 1 application topically as needed (for port).     LORazepam 2 MG/ML concentrated solution  Commonly known as:  ATIVAN  Take 0.5 mLs (1 mg total) by mouth every 4 (four) hours as needed for anxiety.     morphine CONCENTRATE 10 mg / 0.5 ml concentrated solution  Take 0.25 mLs (5 mg total) by mouth every 2 (two) hours as needed for moderate pain or shortness of breath.     ondansetron 4 MG tablet  Commonly known as:  ZOFRAN  Take 1 tablet (4 mg total) by mouth every 6 (six) hours as needed for nausea.     OxyCODONE  20 mg T12a 12 hr tablet  Commonly known as:  OXYCONTIN  Take 1 tablet (20 mg total) by mouth every 12 (twelve) hours.     oxyCODONE 5 MG immediate release tablet  Commonly known as:  Oxy IR/ROXICODONE  Take 1-3 tablets by mouth every 4 hours as needed     polyethylene glycol packet  Commonly known as:  MIRALAX / GLYCOLAX  Take 17 g by mouth daily as needed for mild constipation.     prochlorperazine 5 MG tablet  Commonly known as:  COMPAZINE  Take 1 tablet (5 mg total) by mouth every 6 (six) hours as needed for nausea or vomiting.     scopolamine 1 MG/3DAYS  Commonly known as:  TRANSDERM-SCOP  Place 1 patch (1.5 mg total) onto the skin every 3 (three) days.     Vancomycin 750 MG/150ML Soln  Commonly known as:  VANCOCIN  Inject 150 mLs (750 mg total) into the vein every 12 (twelve) hours.- to complete on 09/04/14        Day of Discharge Exam BP 156/64  Pulse 125  Temp(Src) 100.9 F (38.3 C) (Axillary)  Resp  20  Ht _0  (1.88 m)  Wt 80.7 kg (177 lb 14.6 oz)  BMI 22.83 kg/m2  SpO2 92%  Physical Exam: General appearance: will answer questions with eyes closed, disoriented Eyes: no scleral icterus Throat: oropharynx dry Resp: clear to auscultation bilaterally Cardio: regular rate and rhythm GI: soft, non-tender; bowel sounds normal; no masses,  no organomegaly Extremities: no clubbing, cyanosis or edema; right hip dressing intact with no surrounding erythema or drainage   Signed: Marton Redwood 08/31/2014, 11:04 AM

## 2014-08-31 NOTE — Progress Notes (Signed)
CARE MANAGEMENT NOTE 08/31/2014  Patient:  Matthew White, Matthew White   Account Number:  [id number]  Date Initiated:  08/30/2014  Documentation initiated by:  Leafy Kindle  Subjective/Objective Assessment:   Pt admitted with PNA, Tachypnea, Tachycardia.  Hx of metastatic esophageal CA.     Action/Plan:   From home with wife   Anticipated DC Date:  08/31/2014   Anticipated DC Plan:  [name]  CM consult      Choice offered to / List presented to:  [name] Spouse        HH arranged  HH-1 RN  IV Antibiotics      HH agency  [name]   Status of service:  Completed, signed off Medicare Important Message given?  YES (If response is "NO", the following Medicare IM given date fields will be blank) Date Medicare IM given:  08/30/2014 Medicare IM given by:  Leafy Kindle Date Additional Medicare IM given:   Additional Medicare IM given by:    Discharge Disposition:  [name]  Per UR Regulation:  Reviewed for med. necessity/level of care/duration of stay  If discussed at [name] of Stay Meetings, dates discussed:    Comments:  08/31/2014 1100 Faxed Abx Rx to [name]. Contacted [name] for delivery of DME. Pt scheduled dc home with [name]. Faxed dc summary to [name]. [name] RN CCM Case Mgmt phone [phone]  08/30/14 [name],[name],[name] [phone] Met with pt and wife to offer choice for home hospice per MD order.  Pts wife Matthew White is a Therapist, sports and very knowledgeble about pts care needs.  They would like to use [facility] for in home hospice services.  Matthew White states that she is not sure if [facility] will accept pt due to her wanting to continue the pt on home TPN and IV abx until her children are able to arrive from [place] the country.  Matthew White states that if [facility] does not accept them then she would like to use [name] as her [facility] choice.  [name] from [facility] called and informed of pt situation.  CM will continue to  follow for DC needs.

## 2014-09-03 ENCOUNTER — Telehealth: Payer: Self-pay | Admitting: Radiation Oncology

## 2014-09-03 NOTE — Telephone Encounter (Signed)
Phoned to assess patient status. Wife reports he is declining quickly at home with hospice. Reports he is paralyzed and unable to see or speak. All of his children have made it home to say their good byes. Listened and offered support. Understands to call with needs.

## 2014-09-10 ENCOUNTER — Ambulatory Visit: Payer: Medicare PPO

## 2014-09-10 ENCOUNTER — Ambulatory Visit: Payer: Medicare PPO | Admitting: Oncology

## 2014-09-10 ENCOUNTER — Other Ambulatory Visit: Payer: Medicare PPO

## 2014-09-12 DEATH — deceased

## 2015-06-06 IMAGING — CR DG CHEST 2V
2 series · 2 of 2 positions shown · non-contrast
Comparison: Chest x-ray of March 07, 2014 and CT scan of the chest
of May 17, 2014

CLINICAL DATA: Esophageal malignancy

EXAM:
CHEST  2 VIEW

[w chest pa]
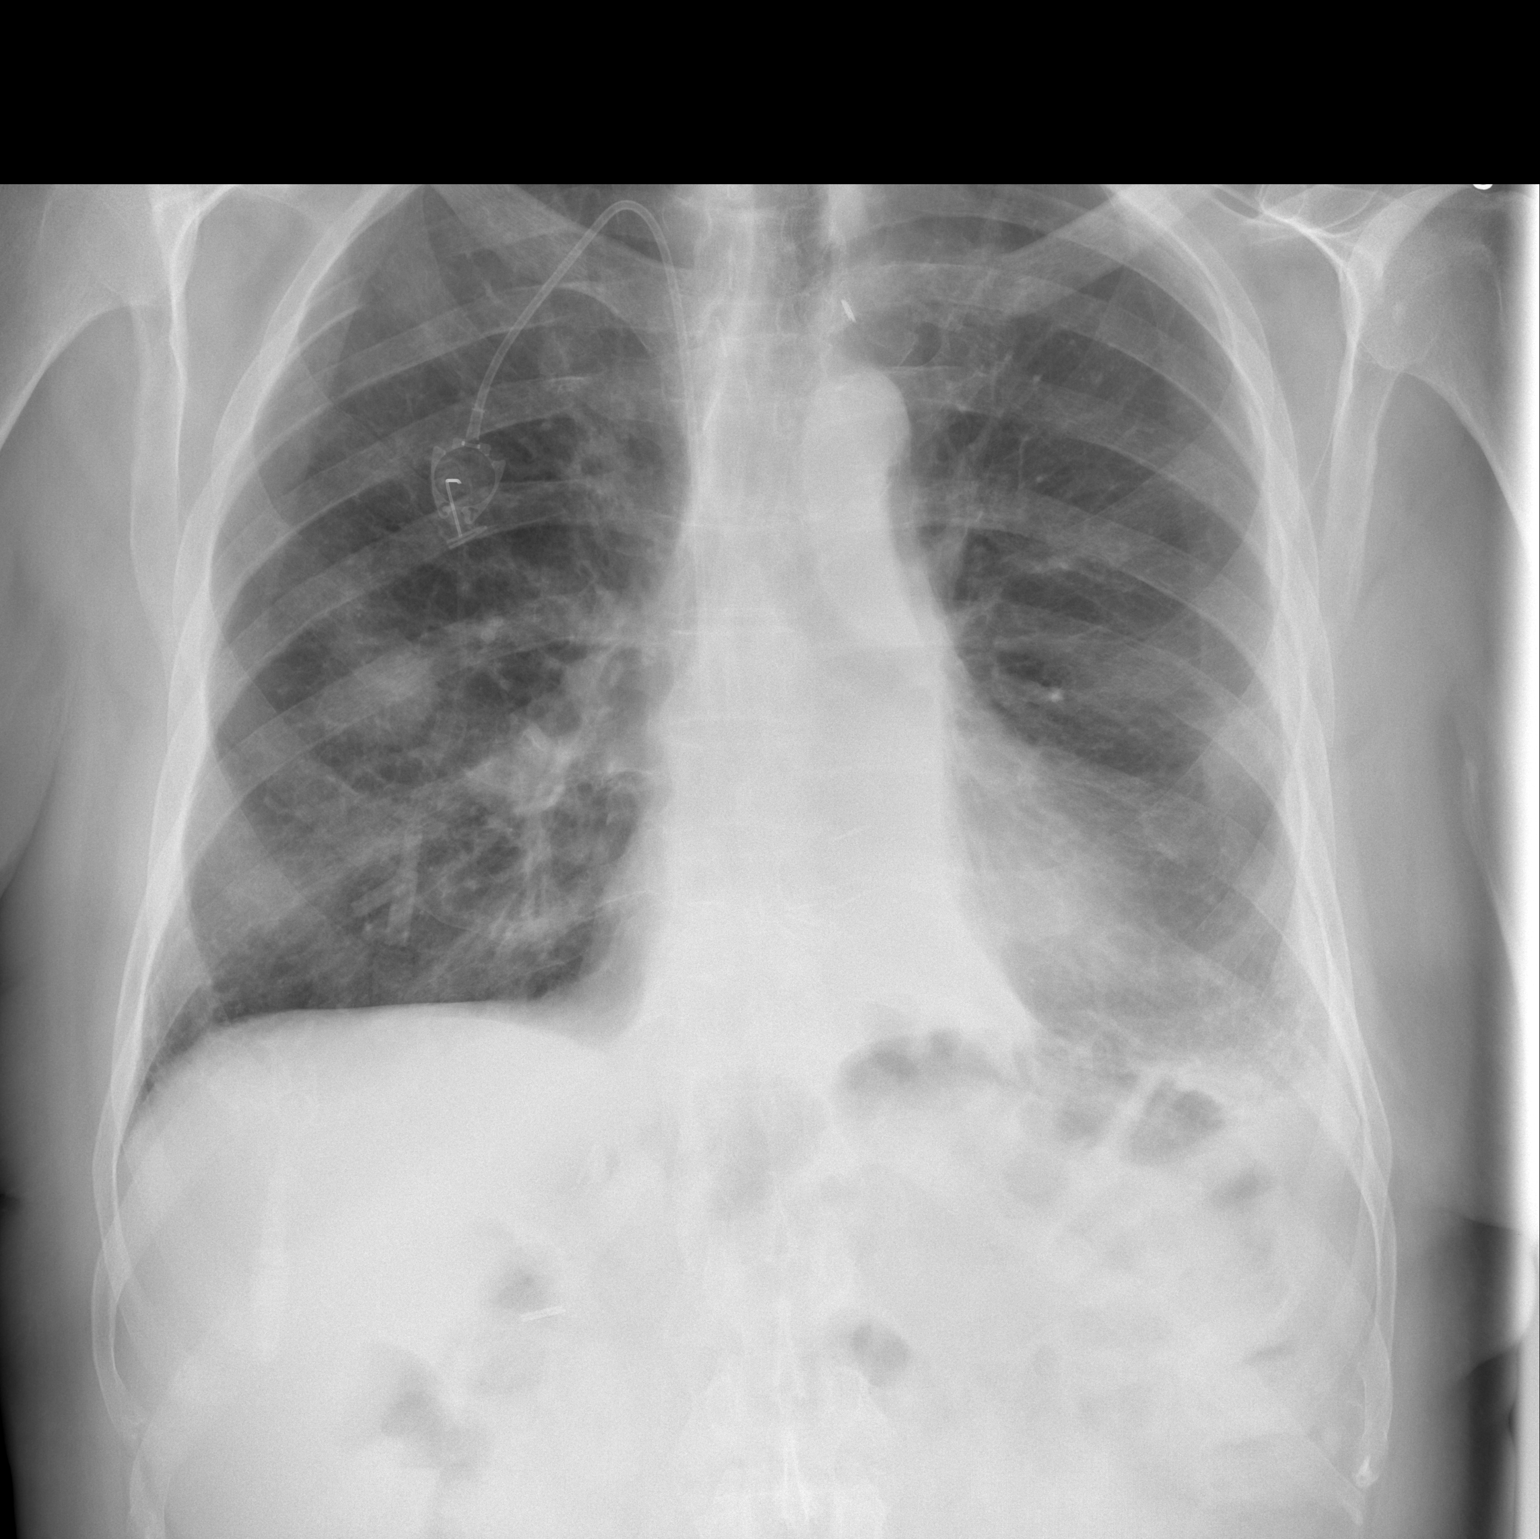

[w chest lat]
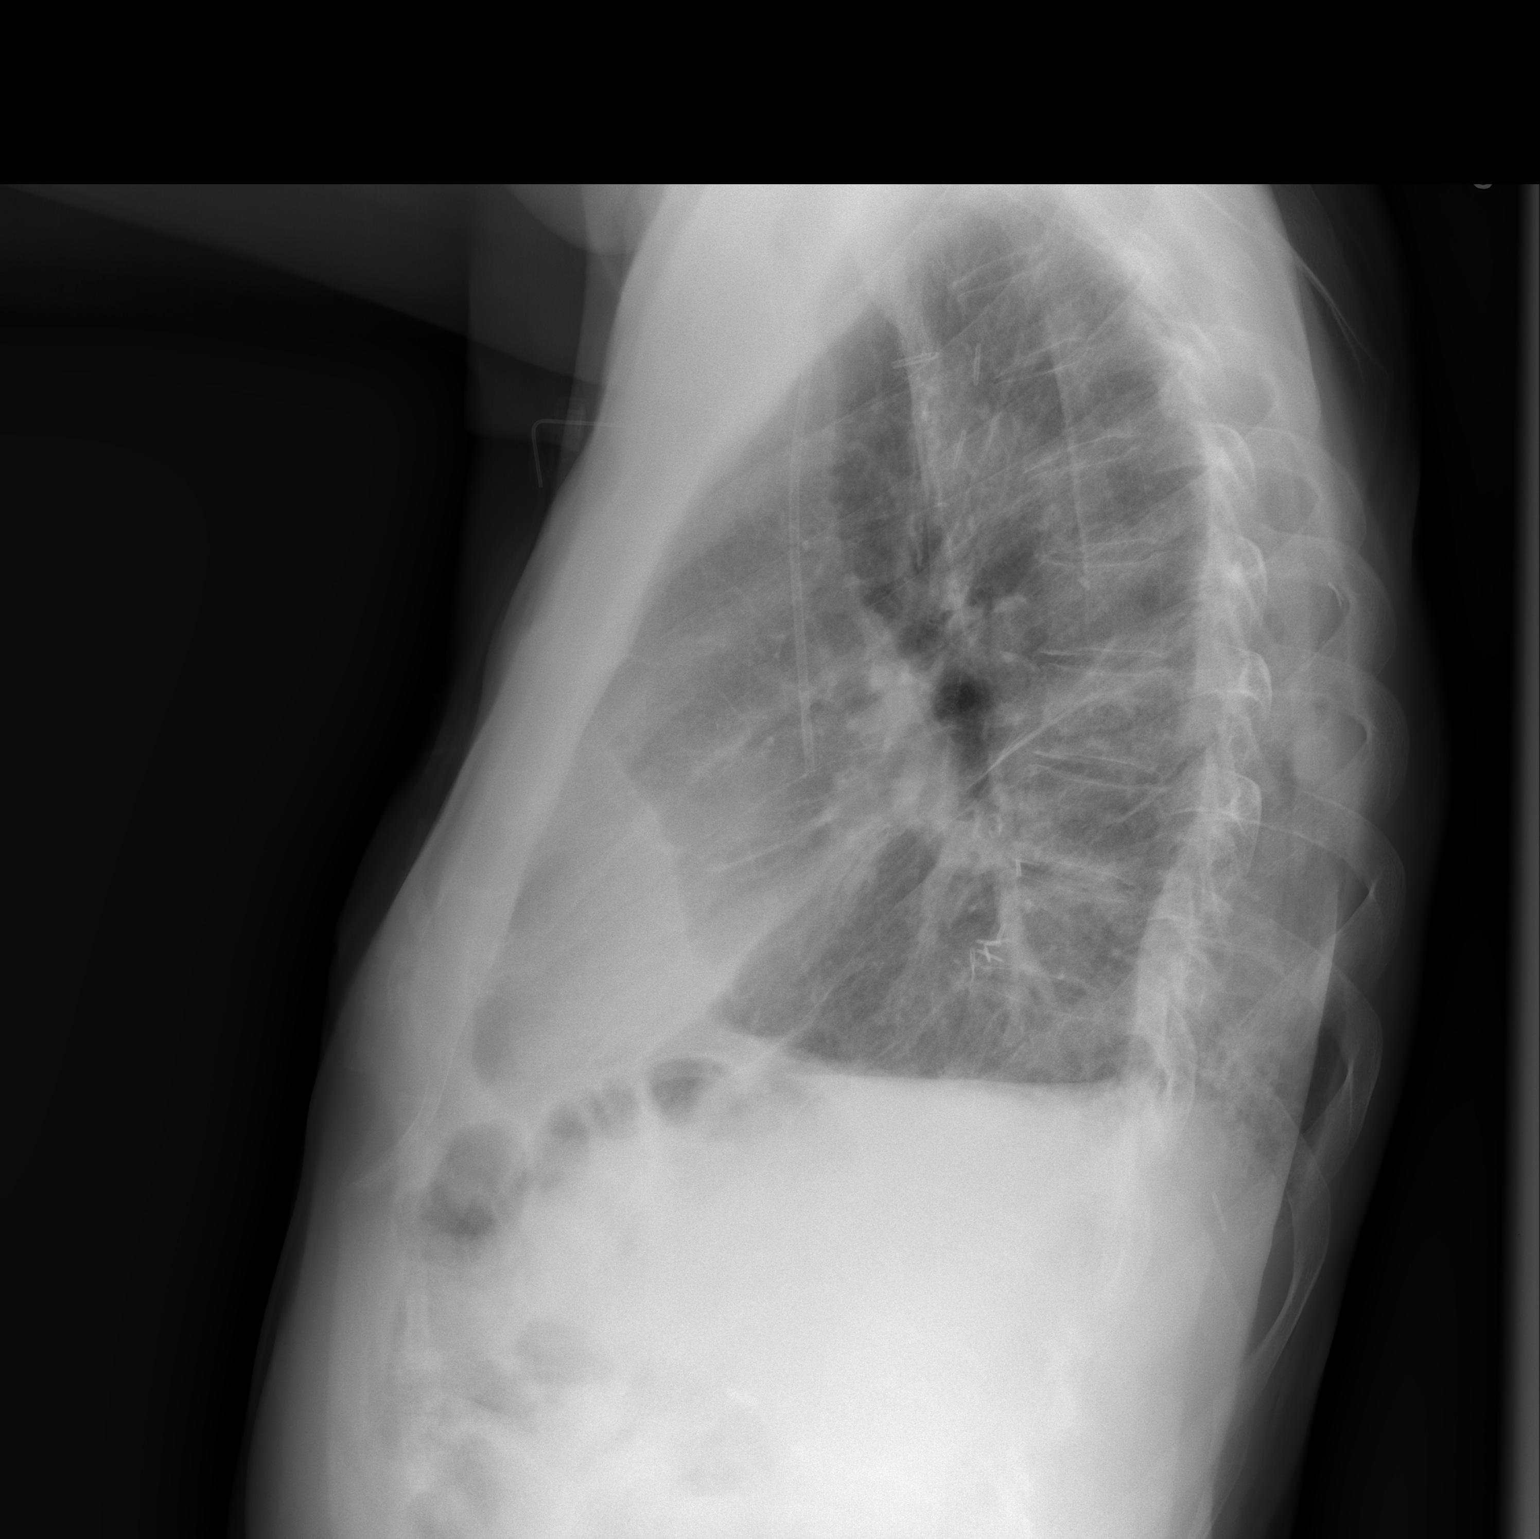

[2 of 2 positions shown; findings below may reference images not displayed]

FINDINGS: There has been interval growth of the right lower lobe pleural-based
mass. It now measures 3.3 cm in diameter. No other masses on the
right are demonstrated. On the left the heart border is partially
obscured but this is stable. The cardiac silhouette is normal in
size. The pulmonary vascularity is not engorged. The mediastinal
mass is not evident on today's study. The power port appliance tip
lies in the midportion of the SVC. The known bony lesions are not
clearly evident on today's study.
IMPRESSION: There has been interval growth of a posterior right lower lobe mass
consistent with metastatic disease. Otherwise there is no
significant change in the appearance of the chest since the previous
study.

## 2015-07-09 IMAGING — CR DG CHEST 1V PORT
1 series · 1 of 1 positions shown · non-contrast
Comparison: 08/20/2014 CT, 07/24/2014 chest radiograph

CLINICAL DATA: Fever, review of electronic medical record indicates
a history of esophageal cancer

EXAM:
PORTABLE CHEST - 1 VIEW

[AP]
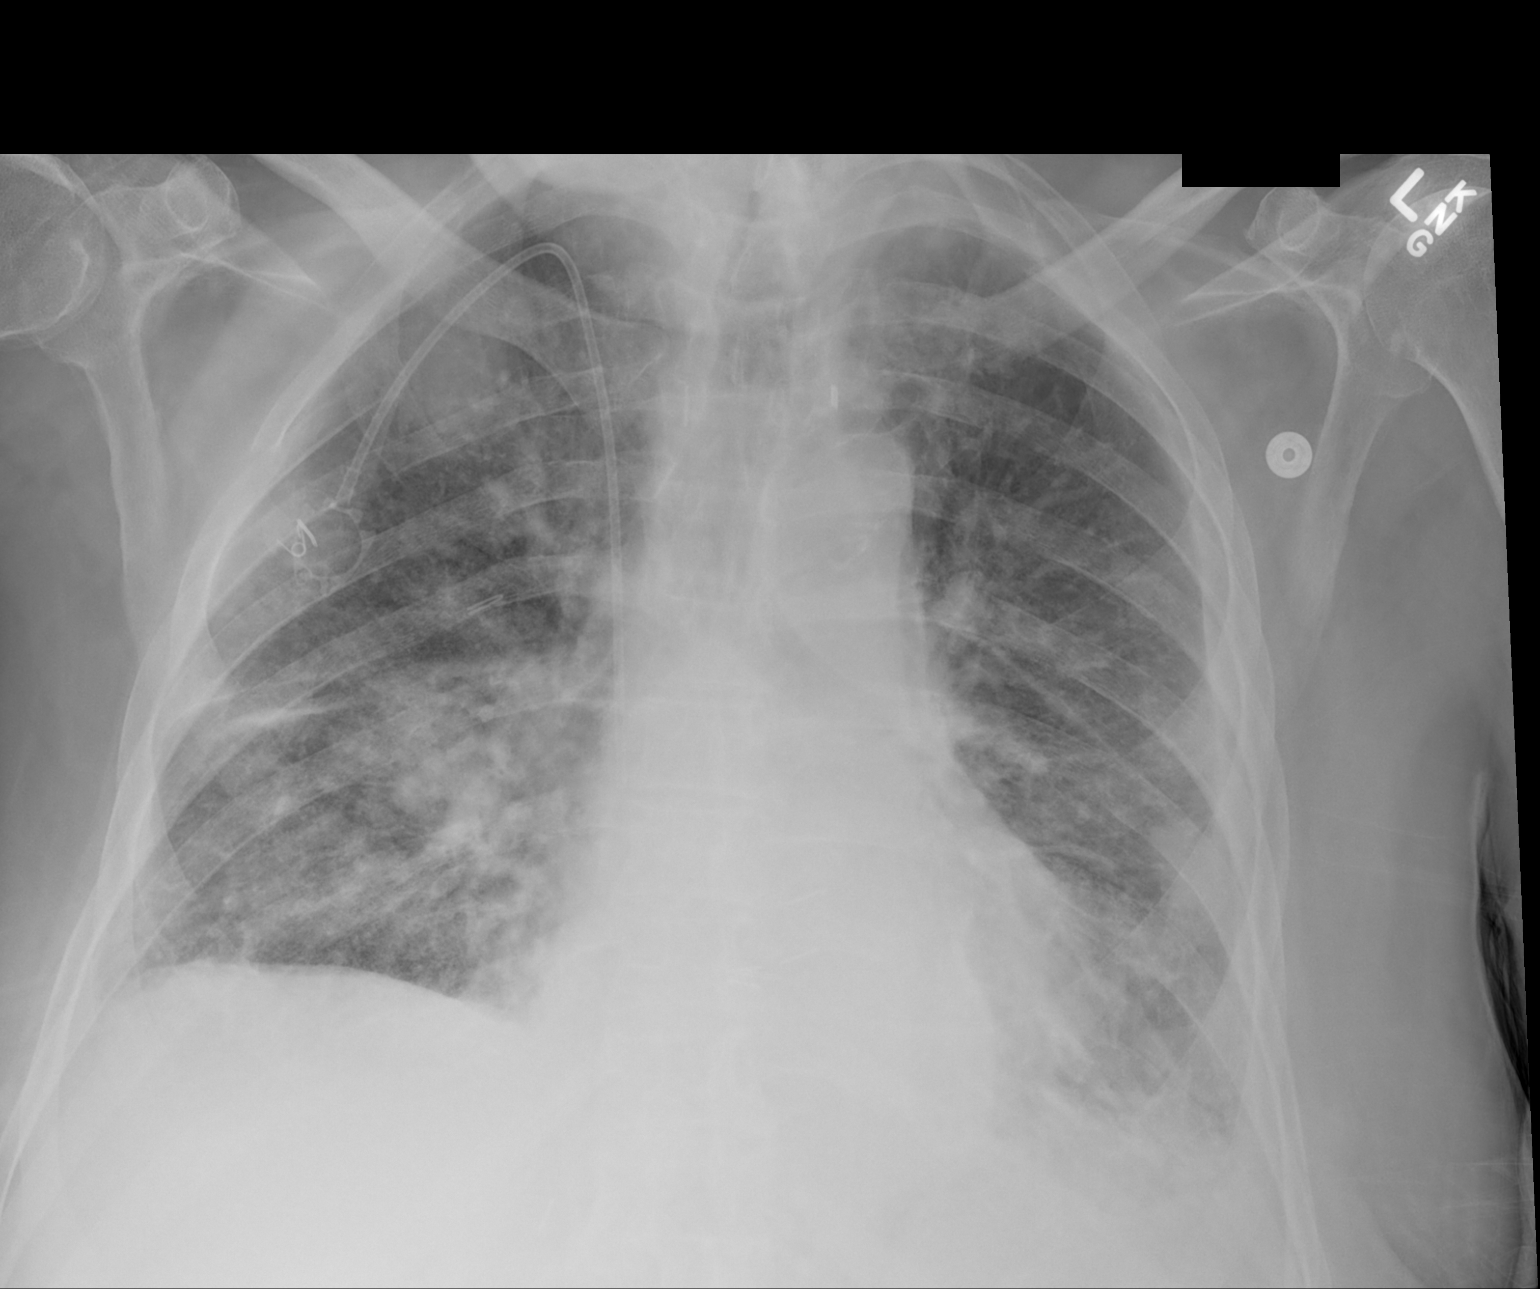

[1 of 1 positions shown; findings below may reference images not displayed]

FINDINGS: Right-sided Port-A-Cath in place with tip at the cavoatrial
junction. Heart size upper limits of normal. Increased left pleural
effusion is identified, currently small. Trace right pleural
effusion is new since the prior study. There are new patchy
ill-defined airspace opacities bilaterally with central vascular
congestion. Underlying pulmonary nodules are reidentified but poorly
visualized as compared to the prior exam.
IMPRESSION: New patchy bilateral airspace opacities in the setting of increased
pleural effusions and mild cardiomegaly, most likely edema. However,
progression of metastatic disease or other alveolar filling
processes could appear similar.

## 2015-07-12 IMAGING — DX DG CHEST 1V PORT
1 series · 2 of 2 positions shown · non-contrast
Comparison: Portable chest x-ray August 26, 2014

CLINICAL DATA: Follow-up of pneumonia

EXAM:
PORTABLE CHEST - 1 VIEW

[Series 1: chest ap · 0.14mm/px · 2 of 2 slices shown]
[im 1/2]
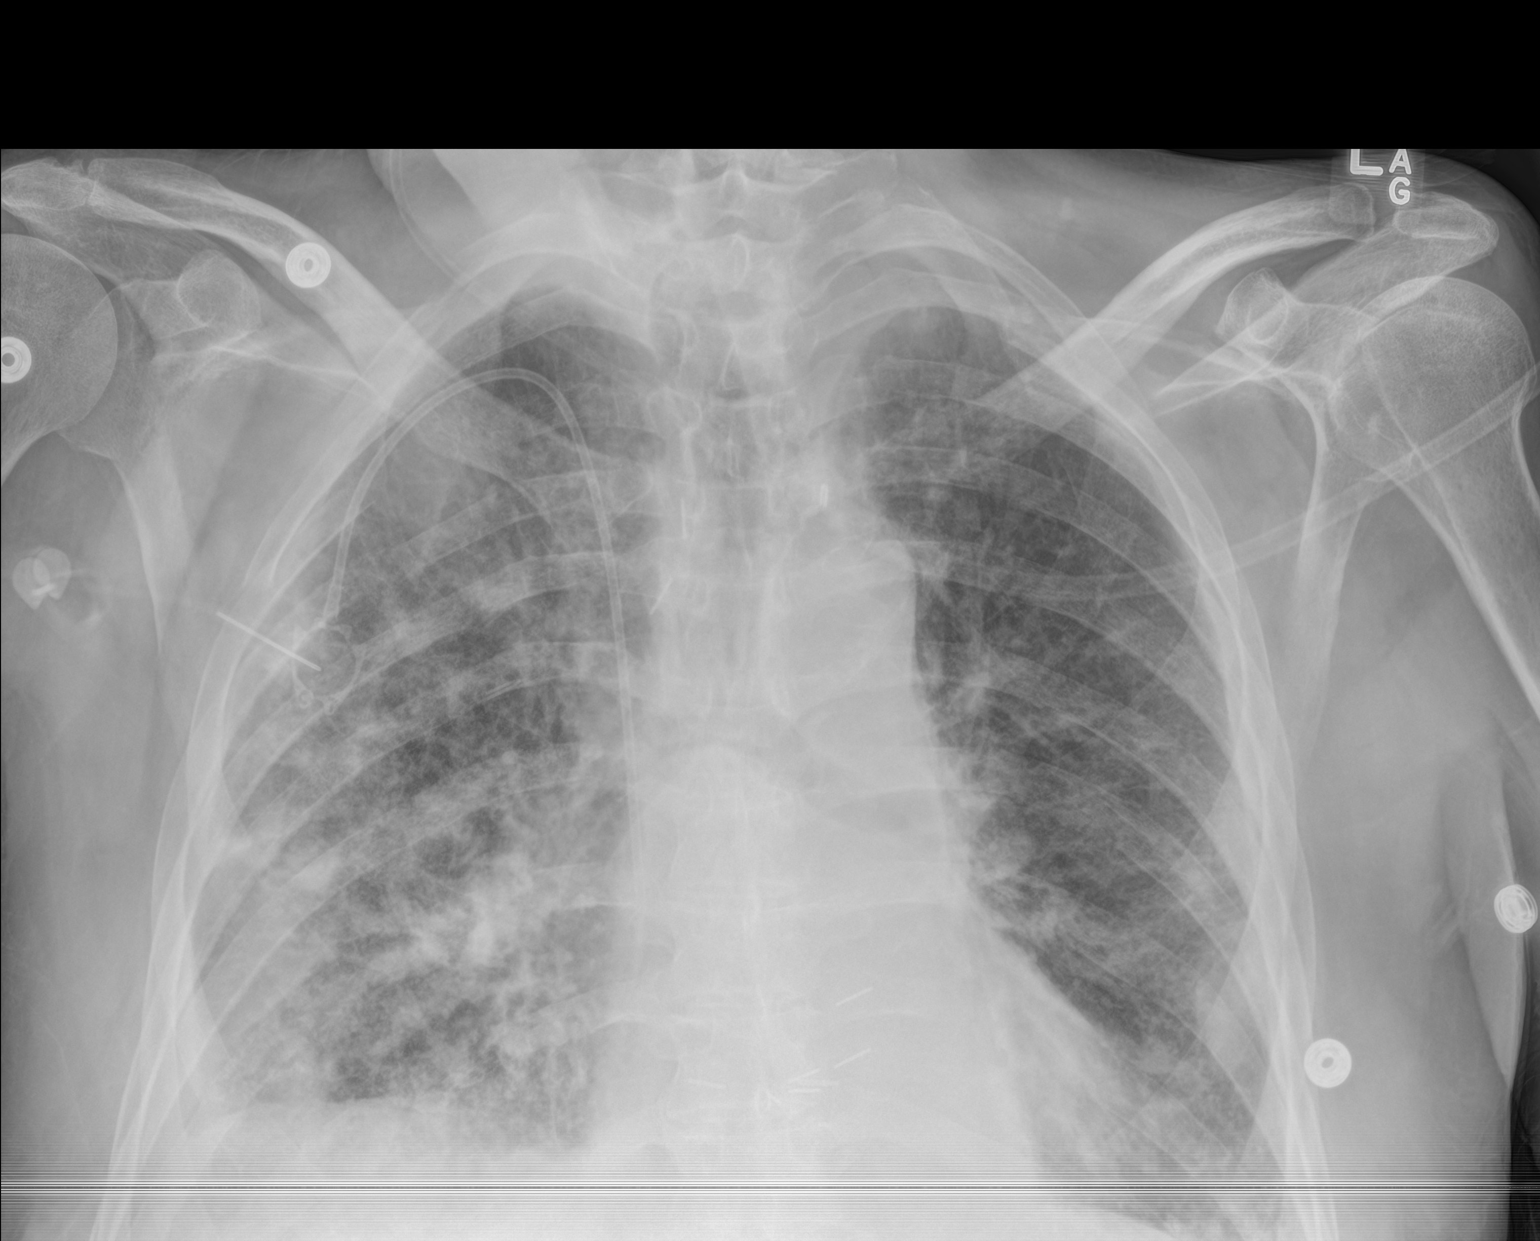
[im 2/2]
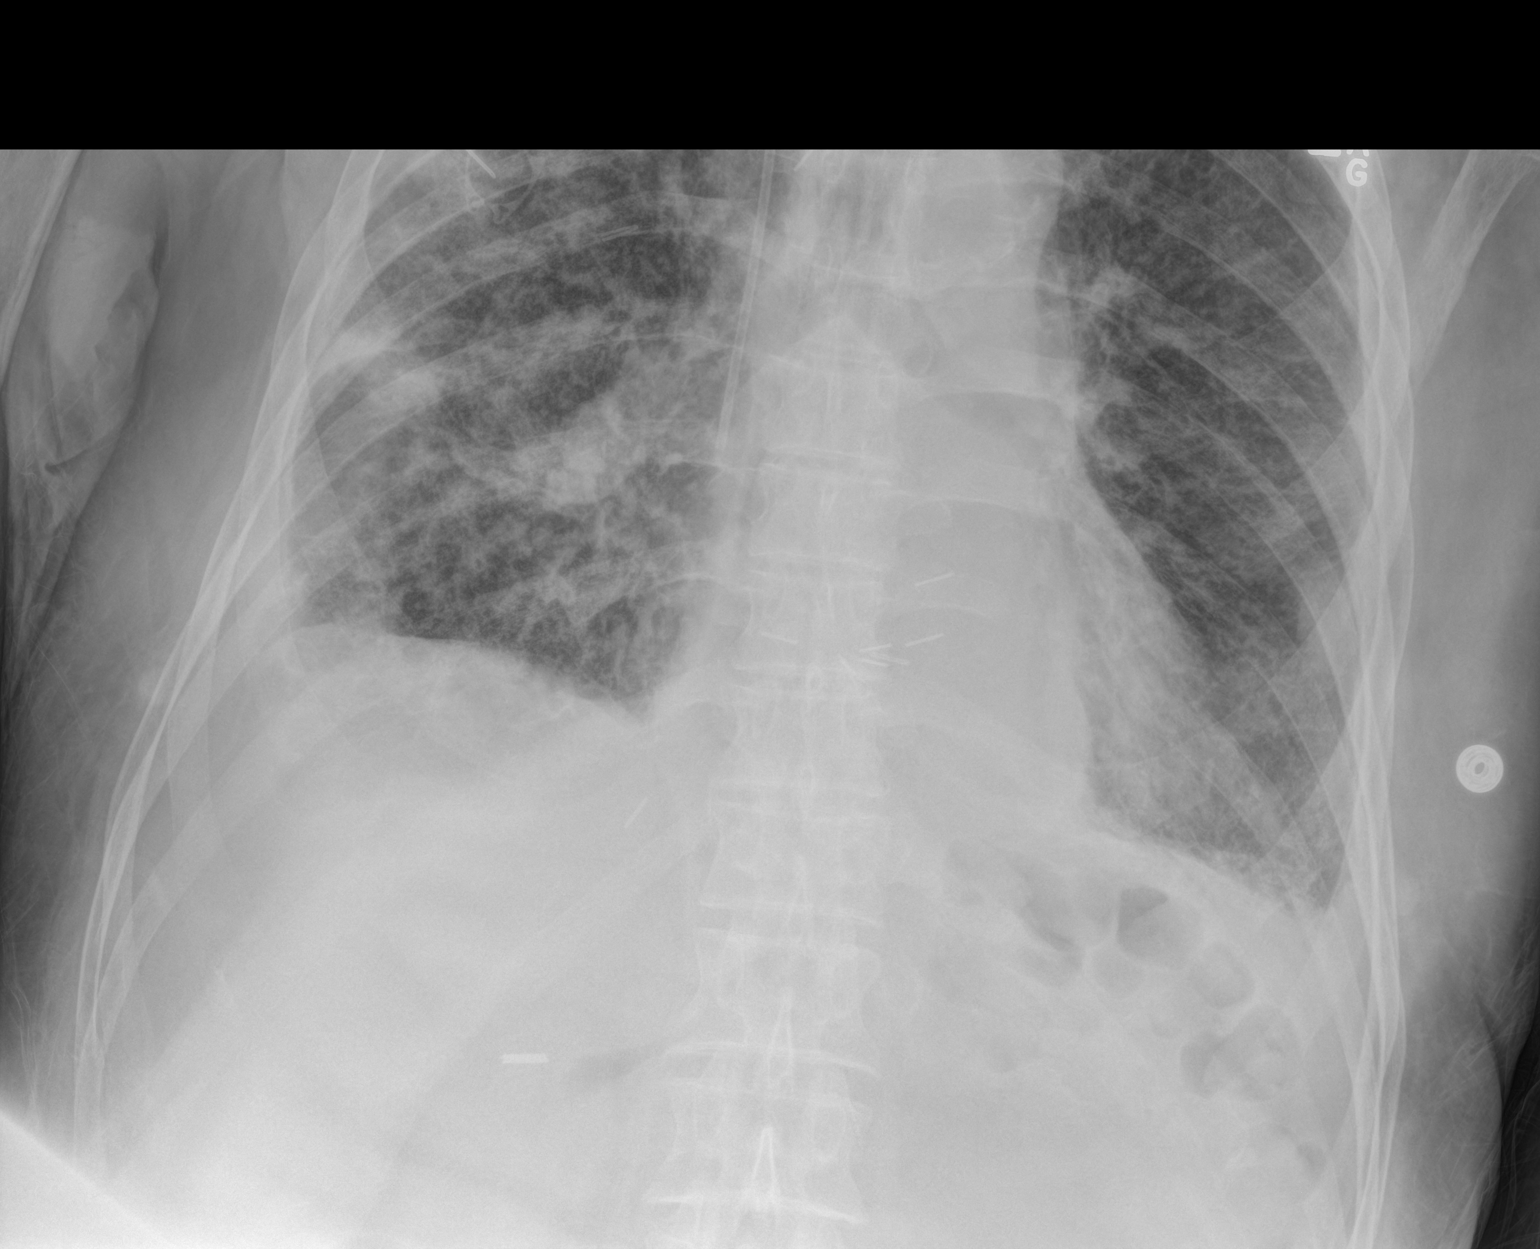

[2 of 2 positions shown; findings below may reference images not displayed]

FINDINGS: There has been mild interval deterioration in the appearance of the
pulmonary interstitium especially on the right. Areas of confluent
densitypersistent in the mid lung. On the left the interstitial
markings are mildly increased and stable. A small amount of pleural
fluid blunts the lateral costophrenic angles. The cardiac silhouette
is normal in size. The power port appliance tip overlies the distal
aspect of the SVC.
IMPRESSION: There has been slight interval worsening of the appearance of the
pulmonary interstitium especially on the right consistent with
worsening pneumonia.

## 2016-05-28 ENCOUNTER — Other Ambulatory Visit: Payer: Self-pay | Admitting: Nurse Practitioner
